# Patient Record
Sex: Female | Born: 1940
Health system: Southern US, Community
[De-identification: ages and names within clinical notes are randomized; demographics above are authoritative.]

## PROBLEM LIST (undated history)

## (undated) DIAGNOSIS — Z460 Encounter for fitting and adjustment of spectacles and contact lenses: Secondary | ICD-10-CM

## (undated) DIAGNOSIS — Z1501 Genetic susceptibility to malignant neoplasm of breast: Secondary | ICD-10-CM

## (undated) DIAGNOSIS — T783XXA Angioneurotic edema, initial encounter: Secondary | ICD-10-CM

## (undated) DIAGNOSIS — Z1379 Encounter for other screening for genetic and chromosomal anomalies: Secondary | ICD-10-CM

## (undated) DIAGNOSIS — Z1589 Genetic susceptibility to other disease: Principal | ICD-10-CM

## (undated) DIAGNOSIS — M1990 Unspecified osteoarthritis, unspecified site: Secondary | ICD-10-CM

## (undated) DIAGNOSIS — K5790 Diverticulosis of intestine, part unspecified, without perforation or abscess without bleeding: Secondary | ICD-10-CM

## (undated) DIAGNOSIS — K219 Gastro-esophageal reflux disease without esophagitis: Secondary | ICD-10-CM

## (undated) DIAGNOSIS — Z1509 Genetic susceptibility to other malignant neoplasm: Secondary | ICD-10-CM

## (undated) DIAGNOSIS — K5900 Constipation, unspecified: Secondary | ICD-10-CM

## (undated) DIAGNOSIS — Z853 Personal history of malignant neoplasm of breast: Secondary | ICD-10-CM

## (undated) DIAGNOSIS — Z1502 Genetic susceptibility to malignant neoplasm of ovary: Secondary | ICD-10-CM

## (undated) DIAGNOSIS — Z809 Family history of malignant neoplasm, unspecified: Secondary | ICD-10-CM

## (undated) DIAGNOSIS — I10 Essential (primary) hypertension: Secondary | ICD-10-CM

## (undated) HISTORY — DX: Genetic susceptibility to malignant neoplasm of breast: Z15.09

## (undated) HISTORY — PX: COLONOSCOPY: SHX174

## (undated) HISTORY — DX: Personal history of malignant neoplasm of breast: Z85.3

## (undated) HISTORY — PX: TUBAL LIGATION: SHX77

## (undated) HISTORY — DX: Genetic susceptibility to malignant neoplasm of breast: Z15.02

## (undated) HISTORY — DX: Encounter for other screening for genetic and chromosomal anomalies: Z13.79

## (undated) HISTORY — DX: Genetic susceptibility to other disease: Z15.89

## (undated) HISTORY — DX: Genetic susceptibility to malignant neoplasm of breast: Z15.01

## (undated) HISTORY — DX: Family history of malignant neoplasm, unspecified: Z80.9

## (undated) HISTORY — DX: Angioneurotic edema, initial encounter: T78.3XXA

---

## 1996-07-31 DIAGNOSIS — Z853 Personal history of malignant neoplasm of breast: Secondary | ICD-10-CM

## 1996-07-31 HISTORY — PX: BREAST BIOPSY: SHX20

## 1996-07-31 HISTORY — DX: Personal history of malignant neoplasm of breast: Z85.3

## 1996-07-31 HISTORY — PX: BREAST LUMPECTOMY: SHX2

## 1998-06-28 ENCOUNTER — Encounter: Admission: RE | Admit: 1998-06-28 | Discharge: 1998-07-27 | Payer: Self-pay | Admitting: Family Medicine

## 1999-02-17 ENCOUNTER — Other Ambulatory Visit: Admission: RE | Admit: 1999-02-17 | Discharge: 1999-02-17 | Payer: Self-pay | Admitting: Family Medicine

## 1999-06-17 ENCOUNTER — Ambulatory Visit (HOSPITAL_COMMUNITY): Admission: RE | Admit: 1999-06-17 | Discharge: 1999-06-17 | Payer: Self-pay | Admitting: Gastroenterology

## 1999-06-21 ENCOUNTER — Other Ambulatory Visit: Admission: RE | Admit: 1999-06-21 | Discharge: 1999-06-21 | Payer: Self-pay | Admitting: Family Medicine

## 1999-11-03 ENCOUNTER — Encounter: Payer: Self-pay | Admitting: Family Medicine

## 1999-11-03 ENCOUNTER — Encounter: Admission: RE | Admit: 1999-11-03 | Discharge: 1999-11-03 | Payer: Self-pay | Admitting: Family Medicine

## 2000-11-08 ENCOUNTER — Encounter: Payer: Self-pay | Admitting: Oncology

## 2000-11-08 ENCOUNTER — Encounter: Admission: RE | Admit: 2000-11-08 | Discharge: 2000-11-08 | Payer: Self-pay | Admitting: Oncology

## 2001-11-28 ENCOUNTER — Encounter: Admission: RE | Admit: 2001-11-28 | Discharge: 2001-11-28 | Payer: Self-pay | Admitting: Oncology

## 2001-11-28 ENCOUNTER — Encounter: Payer: Self-pay | Admitting: Oncology

## 2002-12-03 ENCOUNTER — Encounter: Payer: Self-pay | Admitting: Oncology

## 2002-12-03 ENCOUNTER — Encounter: Admission: RE | Admit: 2002-12-03 | Discharge: 2002-12-03 | Payer: Self-pay | Admitting: Oncology

## 2003-10-26 ENCOUNTER — Other Ambulatory Visit: Admission: RE | Admit: 2003-10-26 | Discharge: 2003-10-26 | Payer: Self-pay | Admitting: Family Medicine

## 2003-12-21 ENCOUNTER — Encounter: Admission: RE | Admit: 2003-12-21 | Discharge: 2003-12-21 | Payer: Self-pay | Admitting: Family Medicine

## 2004-01-26 ENCOUNTER — Encounter: Admission: RE | Admit: 2004-01-26 | Discharge: 2004-01-29 | Payer: Self-pay | Admitting: Family Medicine

## 2004-10-26 ENCOUNTER — Other Ambulatory Visit: Admission: RE | Admit: 2004-10-26 | Discharge: 2004-10-26 | Payer: Self-pay | Admitting: Family Medicine

## 2004-12-22 ENCOUNTER — Encounter: Admission: RE | Admit: 2004-12-22 | Discharge: 2004-12-22 | Payer: Self-pay | Admitting: Family Medicine

## 2005-11-02 ENCOUNTER — Other Ambulatory Visit: Admission: RE | Admit: 2005-11-02 | Discharge: 2005-11-02 | Payer: Self-pay | Admitting: Family Medicine

## 2006-01-18 ENCOUNTER — Encounter: Admission: RE | Admit: 2006-01-18 | Discharge: 2006-01-18 | Payer: Self-pay | Admitting: Family Medicine

## 2007-01-22 ENCOUNTER — Encounter: Admission: RE | Admit: 2007-01-22 | Discharge: 2007-01-22 | Payer: Self-pay | Admitting: Family Medicine

## 2008-01-23 ENCOUNTER — Encounter: Admission: RE | Admit: 2008-01-23 | Discharge: 2008-01-23 | Payer: Self-pay | Admitting: Family Medicine

## 2008-11-18 ENCOUNTER — Other Ambulatory Visit: Admission: RE | Admit: 2008-11-18 | Discharge: 2008-11-18 | Payer: Self-pay | Admitting: Family Medicine

## 2009-01-25 ENCOUNTER — Encounter: Admission: RE | Admit: 2009-01-25 | Discharge: 2009-01-25 | Payer: Self-pay | Admitting: Family Medicine

## 2010-01-26 ENCOUNTER — Encounter: Admission: RE | Admit: 2010-01-26 | Discharge: 2010-01-26 | Payer: Self-pay | Admitting: Family Medicine

## 2010-07-31 HISTORY — PX: ABDOMINAL HYSTERECTOMY: SHX81

## 2010-07-31 HISTORY — PX: ANTERIOR AND POSTERIOR REPAIR: SHX1172

## 2010-08-21 ENCOUNTER — Encounter: Payer: Self-pay | Admitting: Family Medicine

## 2010-10-06 ENCOUNTER — Encounter (HOSPITAL_COMMUNITY)
Admission: RE | Admit: 2010-10-06 | Discharge: 2010-10-06 | Disposition: A | Discharge status: Home / Self Care | Payer: Medicare Other | Source: Ambulatory Visit | Attending: Obstetrics and Gynecology | Admitting: Obstetrics and Gynecology

## 2010-10-06 DIAGNOSIS — Z01812 Encounter for preprocedural laboratory examination: Secondary | ICD-10-CM | POA: Insufficient documentation

## 2010-10-06 DIAGNOSIS — Z01810 Encounter for preprocedural cardiovascular examination: Secondary | ICD-10-CM | POA: Insufficient documentation

## 2010-10-06 LAB — COMPREHENSIVE METABOLIC PANEL
ALT: 21 U/L (ref 0–35)
Calcium: 9.4 mg/dL (ref 8.4–10.5)
Chloride: 92 mEq/L — ABNORMAL LOW (ref 96–112)
GFR calc Af Amer: >60 mL/min (ref >60)
Potassium: 3.1 mEq/L — ABNORMAL LOW (ref 3.5–5.1)
Sodium: 130 mEq/L — ABNORMAL LOW (ref 135–145)
Total Bilirubin: 1.2 mg/dL (ref 0.3–1.2)
Total Protein: 7 g/dL (ref 6.0–8.3)

## 2010-10-06 LAB — URINALYSIS, ROUTINE W REFLEX MICROSCOPIC
Bilirubin Urine: NEGATIVE
Glucose, UA: NEGATIVE mg/dL
Protein, ur: NEGATIVE mg/dL
pH: 6.5 (ref 5.0–8.0)

## 2010-10-06 LAB — CBC
MCHC: 35.3 g/dL (ref 30.0–36.0)
MCV: 91.7 fL (ref 78.0–100.0)
RBC: 4.2 MIL/uL (ref 3.87–5.11)
RDW: 12 % (ref 11.5–15.5)

## 2010-10-06 LAB — PROTIME-INR: Prothrombin Time: 12.9 seconds (ref 11.6–15.2)

## 2010-10-06 LAB — APTT: aPTT: 29 seconds (ref 24–37)

## 2010-10-06 LAB — SURGICAL PCR SCREEN: MRSA, PCR: NEGATIVE

## 2010-10-11 ENCOUNTER — Inpatient Hospital Stay (HOSPITAL_COMMUNITY)
Admission: RE | Admit: 2010-10-11 | Discharge: 2010-10-13 | DRG: 743 | Disposition: A | Discharge status: Home / Self Care | Payer: Medicare Other | Source: Ambulatory Visit | Attending: Obstetrics and Gynecology | Admitting: Obstetrics and Gynecology

## 2010-10-11 ENCOUNTER — Other Ambulatory Visit: Payer: Self-pay | Admitting: Obstetrics and Gynecology

## 2010-10-11 DIAGNOSIS — N815 Vaginal enterocele: Secondary | ICD-10-CM | POA: Diagnosis present

## 2010-10-11 DIAGNOSIS — N393 Stress incontinence (female) (male): Secondary | ICD-10-CM | POA: Diagnosis present

## 2010-10-11 DIAGNOSIS — N840 Polyp of corpus uteri: Secondary | ICD-10-CM | POA: Diagnosis present

## 2010-10-11 DIAGNOSIS — K219 Gastro-esophageal reflux disease without esophagitis: Secondary | ICD-10-CM | POA: Diagnosis present

## 2010-10-11 DIAGNOSIS — D251 Intramural leiomyoma of uterus: Secondary | ICD-10-CM | POA: Diagnosis present

## 2010-10-11 DIAGNOSIS — I10 Essential (primary) hypertension: Secondary | ICD-10-CM | POA: Diagnosis present

## 2010-10-11 DIAGNOSIS — N812 Incomplete uterovaginal prolapse: Principal | ICD-10-CM | POA: Diagnosis present

## 2010-10-11 LAB — BASIC METABOLIC PANEL
CO2: 27 mEq/L (ref 19–32)
Chloride: 98 mEq/L (ref 96–112)
Creatinine, Ser: 0.55 mg/dL (ref 0.4–1.2)

## 2010-10-12 LAB — BASIC METABOLIC PANEL
CO2: 28 mEq/L (ref 19–32)
Creatinine, Ser: 0.47 mg/dL (ref 0.4–1.2)
GFR calc non Af Amer: >60 mL/min (ref >60)
Glucose, Bld: 129 mg/dL — ABNORMAL HIGH (ref 70–99)
Potassium: 3.6 mEq/L (ref 3.5–5.1)
Sodium: 135 mEq/L (ref 135–145)

## 2010-10-12 LAB — CBC
HCT: 30 % — ABNORMAL LOW (ref 36.0–46.0)
Hemoglobin: 10.4 g/dL — ABNORMAL LOW (ref 12.0–15.0)
MCH: 32.2 pg (ref 26.0–34.0)
Platelets: 235 10*3/uL (ref 150–400)
RBC: 3.23 MIL/uL — ABNORMAL LOW (ref 3.87–5.11)
RDW: 12.2 % (ref 11.5–15.5)
WBC: 13.1 10*3/uL — ABNORMAL HIGH (ref 4.0–10.5)

## 2010-11-16 NOTE — Op Note (Signed)
Elaine Carter, Elaine Carter                ACCOUNT NO.:  [id number]  MEDICAL RECORD NO.:  [id number]           PATIENT TYPE:  I  LOCATION:  9302                          FACILITY:  [facility]  PHYSICIAN:  Randye Lobo, M.D.   DATE OF BIRTH:  1941-02-15  DATE OF PROCEDURE:  10/11/2010 DATE OF DISCHARGE:                              OPERATIVE REPORT   PREOPERATIVE DIAGNOSES: 1. Incomplete uterovaginal prolapse. 2. Genuine stress incontinence.  POSTOPERATIVE DIAGNOSES: 1. Incomplete uterovaginal prolapse. 2. Genuine stress incontinence.  PROCEDURES: 1. Laparoscopically-assisted vaginal hysterectomy with bilateral     salpingo-oophorectomy. 2. McCall's culdoplasty. 3. Anterior and posterior colporrhaphy. 4. Tension-free vaginal tape, midurethral sling with cystoscopy.  SURGEON:  Randye Lobo, MD  ASSISTANT:  Gretchen Short, PAC.  ANESTHESIA:  General endotracheal, local with 1% lidocaine with epinephrine 1:100,000 and local 0.25% Marcaine to the skin.  IV FLUIDS:  2800 mL Ringer's lactate.  ESTIMATED BLOOD LOSS:  125 mL.  URINE OUTPUT:  Quantity sufficient.  COMPLICATIONS:  None.  INDICATIONS FOR PROCEDURE:  The patient is a 70 year old gravida 2, para 2 Caucasian female, status post bilateral tubal ligation who was referred by her primary physician, Dr. Blair Heys, for evaluation and treatment of progressive vaginal protrusion and urinary incontinence with stressful maneuvers.  The patient underwent multichannel urodynamic testing in the office which confirmed the presence of genuine stress incontinence.  The patient desires surgery.  On physical exam in the office, the patient is noted to have a first-degree cystocele, first-to- second-degree uterine prolapse, and a minimal rectocele.  The uterus is small and no adnexal masses are appreciated.  A plan is now made to proceed with a laparoscopically-assisted vaginal hysterectomy with bilateral salpingo-oophorectomy, and  anterior and posterior colporrhaphy, a midurethral sling and cystoscopy after risks, benefits, and alternatives have been reviewed.  FINDINGS:  Examination under anesthesia revealed first-to-second-degree uterine prolapse.  There was a first-degree cystocele and a first-degree high rectocele versus a small enterocele.  At the time of laparoscopy, the uterus demonstrated a 1.5-cm fundal fibroid.  The tubes were consistent with the bilateral tubal ligation. The ovaries were normal.  The appendix, gallbladder, liver, and peritoneal surfaces appeared to be unremarkable.  Of note, there was a small foreign body detected inside the peritoneal cavity which was a purple piece of plastic or latex which measured less than 1 cm in diameter, this was removed.  Cystoscopy with placement of the sling demonstrated the absence of a foreign body in the bladder, the urethra.  The bladder was visualized throughout 360 degrees including the bladder dome and trigone.  The ureters were noted to be patent bilaterally.  SPECIMEN:  The uterus, tubes, and ovaries were sent to Pathology.  A small piece of plastic latex was removed and discarded.  DESCRIPTION OF PROCEDURE:  The patient was reidentified in the preoperative hold area.  She received cefotetan IV and she received PAS hose and TED stockings.  In the operating room, general endotracheal anesthesia was induced and the patient had both arms tucked by her sides.  She was placed in the dorsal lithotomy position.  The abdomen, vagina, and  perineum were then sterilely prepped and draped.  A Foley catheter was placed inside the bladder.  A speculum was placed inside the vagina and a single-tooth tenaculum was placed on the anterior cervical lip and this was replaced with a Hulka tenaculum.  Attention was turned to the abdomen where a 1.5-cm incision was created infraumbilically along the patient's previous tubal ligation incision. The incision was  created sharply.  Dissection was performed down into the subcutaneous layer to identify the fascia.  The fascia was elevated with 2 Kocher clamps and the fascia was then incised transversely.  The peritoneal cavity was then entered sharply with a Metzenbaum scissors after the peritoneum was grasped with 2 hemostat clamps.  There was no evidence of any adhesions underneath the infraumbilical incision.  The Hasson cannula was placed after the fascia was secured with 0 Vicryl suture.  The laparoscope confirmed proper placement and then the CO2 pneumoperitoneum was achieved.  The patient was placed in the Trendelenburg position.  A 5-mm right and left lower quadrant incisions were created with a scalpel and 5-mm trocars were placed under direct visualization of the laparoscope.  An inspection of the pelvic and abdominal organs was performed and the findings are as noted above.  The piece of latex was identified and this was removed and confirmed to be out of the peritoneal cavity. Please note that this piece of latex was ultimately removed with final removal of the right lower quadrant trocar at the termination of the case.  Each of the ureters were identified.  A hysterectomy was performed using the gyrus instrument.  The left infundibulopelvic ligament was grasped with the gyrus and triply cauterized prior to being bisected. Dissection continued through the broad ligament using the same instrument.  The left round ligament was similarly cauterized and dissected using the gyrus.  Dissection was then performed through the anterior leaf of the broad ligament using the same instrument.  A monopolar scissors was used to dissect down the bladder flap anteriorly and posteriorly.  The uterine artery was triply cauterized and cut using the gyrus instrument.  The same procedure that was performed on the left- hand side was then repeated on the right-hand side.  The right uterine artery was  cauterized but not cut.  Hemostasis was excellent at this time and the procedure was continued vaginally.  The laparoscopic instruments were removed and the trocars remained.  The patient was placed in the high lithotomy position.  Lahey clamps were placed on the anterior and posterior cervical lips.  The cervix was injected circumferentially with 1% lidocaine with epinephrine 1:100,000. The cervix was circumscribed with a scalpel.  The posterior cul-de-sac was entered sharply with a curved Mayo scissors.  Digital exam confirmed proper entry into this location.  The long weighted speculum was placed inside the cul-de-sac.  Each of the uterosacral ligaments were then clamped with Heaney clamps, sharply divided, and suture ligated with transfixing sutures of 0 Vicryl.  The bladder was dissected off the anterior cul-de-sac and the anterior cul-de-sac was entered sharply. Again, digital exam confirmed proper entry into this location.  A Deaver was placed in the anterior cul-de-sac.  The inferior aspects of the cardinal ligaments were then clamped, sharply divided, and suture ligated with 0 Vicryl sutures bilaterally.  The specimen was freed at this time and was sent to Pathology.  The posterior vaginal cuff was then whip stitched with a running locked suture of 0 Vicryl.  A McCall culdoplasty was performed at this time  with a 0 Vicryl suture.  The suture was brought through the vaginal cuff at 6 o'clock and into the posterior cul-de-sac, down through the distal left uterosacral ligament, across the posterior cul-de-sac, down through the distal right uterosacral ligament and then into the cul-de-sac and out the vagina again at the 6 o'clock position.  The suture was held until the end of the case, at which time it was tied for excellent vaginal cuff support.  The anterior colporrhaphy was performed at this time.  Allis clamps were used to mark the anterior vaginal mucosa in the midline.   The mucosa was injected with local 1% lidocaine with epinephrine 1:100,000.  The mucosa was incised vertically in the midline.  Sharp and blunt dissection were used to dissect the subvaginal tissue and bladder off the overlying vaginal mucosa bilaterally.  The dissection was carried back to the level of the pubic rami and down to the uterosacral ligament pedicles.  The sling was performed at this time.  A 1-cm suprapubic incisions were created 2 cm to the right and left in the midline.  The sling was performed in a top-down fashion.  The abdominal needle passer was placed through the right suprapubic incision and out through the endopelvic fascia at the level of the mid urethra and lateral to it on the ipsilateral side.  The same was performed on the left-hand side.  The Foley catheter was removed and cystoscopy was performed and the findings are as noted above.  The sling was then attached to the abdominal needle passers and drawn up through the suprapubic incisions.  The positioning on the sling was adjusted and a Kelly clamp was placed between the sling and the urethra as the plastic sheaths were removed.  The sling was in excellent position.  The excess sling was trimmed suprapubically.  The anterior colporrhaphy was next performed by placing vertical mattress sutures of 2-0 Vicryl at the urethrovesical junction and then 0 Vicryl through the fascia overlying the bladder dome.  Excess vaginal mucosa was trimmed and the anterior vaginal mucosa was then closed with a running locked suture of 2-0 Vicryl.  The vaginal cuff was closed at this time by placing a running lock suture of 0 Vicryl.  Attention was then turned to the posterior vaginal mucosa.  The posterior colporrhaphy was performed.  Allis clamps were again used to mark the posterior vaginal mucosa in the midline.  The introitus was marked with Allis clamps as well.  The perineal body and the mucosa were injected with 1%  lidocaine with epinephrine 1:100,000.  A triangular wedge of epithelium was excised from the perineal body in the posterior vaginal mucosa was incised vertically in the midline with the Metzenbaum scissors.  Sharp and blunt dissection were used to dissect the rectovaginal fascia off the overlying vaginal mucosa bilaterally. Rectal exam was performed at this time and this confirmed the presence of a very small enterocele.  The enterocele was reduced by placing vertical mattress sutures of 2-0 Vicryl.  The posterior colporrhaphy was performed by placing vertical mattress sutures of 0 Vicryl.  The excess vaginal mucosa was trimmed and the posterior vaginal mucosa was then closed with a running locked suture of 2-0 Vicryl down to the level of the hymen.  A crown stitch of 0 Vicryl was placed in the perineal body. The remainder of the closure of the perineum was performed with a continued suture of 2-0 Vicryl for an episiotomy repair with a subcuticular suture used to close  the perineum up to the level of the hymen.  Hemostasis was good at this time.  Final rectal exam confirmed the absence of sutures in the rectum.  A vaginal packing of plain gauze was placed in the vagina.  The suprapubic incisions were closed with Dermabond.  Final laparoscopy was performed after the pneumoperitoneum was reachieved.  The pelvis was irrigated and suctioned.  There was no evidence of any bleeding.  The lower abdominal trocars were removed. The piece of latex was confirmed to be removed with a trocar of the right lower quadrant.  The pneumoperitoneum was released and the umbilical trocar was removed.  A separate figure-of-eight suture of 0 Vicryl was used to close the fascia.  All skin incisions were then closed with subcuticular sutures of 4-0 Vicryl.  The skin incisions were injected locally with 0.25% Marcaine and Dermabond was placed over the incisions.  The patient's Foley catheter was left to  gravity drainage.  The patient was awakened and extubated and escorted to the recovery room in stable condition.  There were no complications to the procedure.  All needle, instrument, sponge counts were correct.     Randye Lobo, M.D.     BES/MEDQ  D:  10/11/2010  T:  10/12/2010  Job:  [id number]  cc:   Bryan Lemma. Manus Gunning, M.D. Fax: [phone]  Electronically Signed by Conley Simmonds M.D. on 11/16/2010 01:57:29 PM

## 2010-11-17 NOTE — Discharge Summary (Signed)
NAMELAINEE, Elaine Carter                ACCOUNT NO.:  [id number]  MEDICAL RECORD NO.:  [id number]           PATIENT TYPE:  I  LOCATION:  9302                          FACILITY:  [facility]  PHYSICIAN:  Randye Lobo, M.D.   DATE OF BIRTH:  1941/04/19  DATE OF ADMISSION:  10/11/2010 DATE OF DISCHARGE:  10/13/2010                              DISCHARGE SUMMARY   ADMISSION DIAGNOSES: 1. Incomplete uterovaginal prolapse. 2. Genuine stress incontinence. 3. Hyponatremia. 4. Hypokalemia.  DISCHARGE DIAGNOSES: 1. Incomplete uterovaginal prolapse. 2. Genuine stress incontinence. 3. Hyponatremia, resolved. 4. Hypokalemia, resolved.  ADMISSION HISTORY AND PHYSICAL EXAMINATION:  The patient is a 70 year old gravida 2, para 2 Caucasian female who presented with progressive urinary incontinence with stressful maneuvers and the desire for surgical treatment.  The patient's medical history was significant for left breast cancer and hypertension.  The patient was taking multiple medications to control her hypertension including diltiazem, losartan, and Micardis.  On physical examination, the patient's pelvic exam demonstrated normal external genitalia and a normal urethra.  There was a minimal to first- degree cystocele, first to second-degree uterine prolapse, and minimal rectocele.  The uterus was small and nontender.  No adnexal masses nor tenderness were appreciated.  The patient's preoperative sodium was 130 and potassium was 3.1.  The patient was admitted on October 11, 2010, at which time she underwent a laparoscopically-assisted vaginal hysterectomy with bilateral salpingo- oophorectomy, McCall culdoplasty, anterior and posterior colporrhaphy, and tension-free vaginal tape, mid urethral sling with cystoscopy which was performed without complication at the [facility] [place] under the direction of Dr. Conley Simmonds and with the assistance of Gretchen Short, PA-C.  Estimated blood loss  from surgery was 125 mL, and there were no complications.  In the post anesthesia care unit following surgery, the patient's sodium was rechecked and noted to be 130 and her potassium was 2.9.  The patient underwent replacement of electrolytes with normal saline with 20 mEq of KCl per liter.  By postoperative day #1, her sodium was 135 and her potassium was 3.6.  The patient had good control of her pain with a morphine PCA and Toradol during the night following her surgery.  She experienced nausea and vomiting.  The PCA was discontinued on postoperative day #1, and the patient was started on Percocet which she tolerated well.  The patient had her Foley catheter and vaginal packing removed on postop day #1, and she began her voiding trials.  She was able to void well with low postvoid residuals of less than 50 mL.  The patient was found to be in good condition and ready for discharge by postoperative day #2.  She was able to tolerate a regular diet, had good pain control, ambulate independently, and spontaneously void.  DISCHARGE INSTRUCTIONS: 1. Discharged to home. 2. The patient will take the following medications:     a.     Percocet 5 mg/325 mg one to two p.o. q.4-6 h. p.r.n. pain.     b.     Ciprofloxacin 250 mg p.o. b.i.d. x5 days for antibiotic      prophylaxis. 3.  The patient will resume all of her previous medications and their usual dosages. 4. The patient will follow up in the office in 7-10 days for her first     postoperative visit. 5. The patient will have decreased activity for the next 6 weeks.  She     will not drive for 2 weeks.  She will not do any heavy lifting or     have any sexual activity for the next 12 weeks.  The patient will call if she experiences problems with fever, nausea and vomiting, pain uncontrolled by her medication, increasing pain, heavy vaginal bleeding, incisional redness or drainage, or any other concern.     Randye Lobo,  M.D.     BES/MEDQ  D:  11/16/2010  T:  11/17/2010  Job:  [id number]  Electronically Signed by Conley Simmonds M.D. on 11/17/2010 06:39:44 AM

## 2011-01-04 ENCOUNTER — Other Ambulatory Visit: Payer: Self-pay | Admitting: Family Medicine

## 2011-01-04 DIAGNOSIS — Z1231 Encounter for screening mammogram for malignant neoplasm of breast: Secondary | ICD-10-CM

## 2011-01-13 ENCOUNTER — Other Ambulatory Visit: Payer: Self-pay | Admitting: Family Medicine

## 2011-01-13 ENCOUNTER — Ambulatory Visit
Admission: RE | Admit: 2011-01-13 | Discharge: 2011-01-13 | Disposition: A | Discharge status: Home / Self Care | Payer: Medicare Other | Source: Ambulatory Visit | Attending: Family Medicine | Admitting: Family Medicine

## 2011-01-13 DIAGNOSIS — E871 Hypo-osmolality and hyponatremia: Secondary | ICD-10-CM

## 2011-02-02 ENCOUNTER — Ambulatory Visit
Admission: RE | Admit: 2011-02-02 | Discharge: 2011-02-02 | Disposition: A | Discharge status: Home / Self Care | Payer: Medicare Other | Source: Ambulatory Visit | Attending: Family Medicine | Admitting: Family Medicine

## 2011-02-02 DIAGNOSIS — Z1231 Encounter for screening mammogram for malignant neoplasm of breast: Secondary | ICD-10-CM

## 2011-12-20 ENCOUNTER — Other Ambulatory Visit: Payer: Self-pay | Admitting: Plastic Surgery

## 2011-12-20 ENCOUNTER — Ambulatory Visit
Admission: RE | Admit: 2011-12-20 | Discharge: 2011-12-20 | Disposition: A | Discharge status: Home / Self Care | Payer: Medicare Other | Source: Ambulatory Visit | Attending: Plastic Surgery | Admitting: Plastic Surgery

## 2011-12-20 DIAGNOSIS — IMO0002 Reserved for concepts with insufficient information to code with codable children: Secondary | ICD-10-CM

## 2012-01-22 ENCOUNTER — Other Ambulatory Visit: Payer: Self-pay | Admitting: Family Medicine

## 2012-01-22 DIAGNOSIS — Z1231 Encounter for screening mammogram for malignant neoplasm of breast: Secondary | ICD-10-CM

## 2012-02-21 ENCOUNTER — Ambulatory Visit: Payer: Medicare Other

## 2012-02-28 ENCOUNTER — Ambulatory Visit
Admission: RE | Admit: 2012-02-28 | Discharge: 2012-02-28 | Disposition: A | Discharge status: Home / Self Care | Payer: Medicare Other | Source: Ambulatory Visit | Attending: Family Medicine | Admitting: Family Medicine

## 2012-02-28 DIAGNOSIS — Z1231 Encounter for screening mammogram for malignant neoplasm of breast: Secondary | ICD-10-CM

## 2013-01-20 ENCOUNTER — Other Ambulatory Visit: Payer: Self-pay

## 2013-01-20 DIAGNOSIS — Z1231 Encounter for screening mammogram for malignant neoplasm of breast: Secondary | ICD-10-CM

## 2013-01-20 DIAGNOSIS — Z853 Personal history of malignant neoplasm of breast: Secondary | ICD-10-CM

## 2013-01-20 DIAGNOSIS — Z9889 Other specified postprocedural states: Secondary | ICD-10-CM

## 2013-02-03 ENCOUNTER — Encounter (HOSPITAL_BASED_OUTPATIENT_CLINIC_OR_DEPARTMENT_OTHER): Payer: Self-pay | Admitting: *Deleted

## 2013-02-03 NOTE — Progress Notes (Signed)
To come in for bmet-ekg-just got back from [facility]

## 2013-02-04 ENCOUNTER — Other Ambulatory Visit: Payer: Self-pay

## 2013-02-04 ENCOUNTER — Encounter (HOSPITAL_BASED_OUTPATIENT_CLINIC_OR_DEPARTMENT_OTHER)
Admission: RE | Admit: 2013-02-04 | Discharge: 2013-02-04 | Disposition: A | Discharge status: Home / Self Care | Payer: Medicare Other | Source: Ambulatory Visit | Attending: Plastic Surgery | Admitting: Plastic Surgery

## 2013-02-04 LAB — BASIC METABOLIC PANEL
BUN: 22 mg/dL (ref 6–23)
Calcium: 9.4 mg/dL (ref 8.4–10.5)
Creatinine, Ser: 0.69 mg/dL (ref 0.50–1.10)
GFR calc Af Amer: >90 mL/min (ref >90)
GFR calc non Af Amer: 86 mL/min — ABNORMAL LOW (ref >90)
Glucose, Bld: 119 mg/dL — ABNORMAL HIGH (ref 70–99)
Potassium: 3.5 mEq/L (ref 3.5–5.1)

## 2013-02-04 NOTE — Progress Notes (Signed)
ekg cleared by dr Gelene Mink

## 2013-02-06 ENCOUNTER — Encounter (HOSPITAL_BASED_OUTPATIENT_CLINIC_OR_DEPARTMENT_OTHER): Payer: Self-pay | Admitting: Anesthesiology

## 2013-02-06 ENCOUNTER — Encounter (HOSPITAL_BASED_OUTPATIENT_CLINIC_OR_DEPARTMENT_OTHER)
Admission: RE | Disposition: A | Discharge status: Home / Self Care | Payer: Self-pay | Source: Ambulatory Visit | Attending: Plastic Surgery

## 2013-02-06 ENCOUNTER — Ambulatory Visit (HOSPITAL_BASED_OUTPATIENT_CLINIC_OR_DEPARTMENT_OTHER)
Admission: RE | Admit: 2013-02-06 | Discharge: 2013-02-06 | Disposition: A | Discharge status: Home / Self Care | Payer: Medicare Other | Source: Ambulatory Visit | Attending: Plastic Surgery | Admitting: Plastic Surgery

## 2013-02-06 ENCOUNTER — Encounter (HOSPITAL_BASED_OUTPATIENT_CLINIC_OR_DEPARTMENT_OTHER): Payer: Self-pay | Admitting: *Deleted

## 2013-02-06 ENCOUNTER — Ambulatory Visit (HOSPITAL_BASED_OUTPATIENT_CLINIC_OR_DEPARTMENT_OTHER): Payer: Medicare Other | Admitting: Anesthesiology

## 2013-02-06 DIAGNOSIS — K5900 Constipation, unspecified: Secondary | ICD-10-CM | POA: Insufficient documentation

## 2013-02-06 DIAGNOSIS — D1739 Benign lipomatous neoplasm of skin and subcutaneous tissue of other sites: Secondary | ICD-10-CM | POA: Insufficient documentation

## 2013-02-06 DIAGNOSIS — Z9109 Other allergy status, other than to drugs and biological substances: Secondary | ICD-10-CM | POA: Insufficient documentation

## 2013-02-06 DIAGNOSIS — Z79899 Other long term (current) drug therapy: Secondary | ICD-10-CM | POA: Insufficient documentation

## 2013-02-06 DIAGNOSIS — K219 Gastro-esophageal reflux disease without esophagitis: Secondary | ICD-10-CM | POA: Insufficient documentation

## 2013-02-06 DIAGNOSIS — Z885 Allergy status to narcotic agent status: Secondary | ICD-10-CM | POA: Insufficient documentation

## 2013-02-06 DIAGNOSIS — M129 Arthropathy, unspecified: Secondary | ICD-10-CM | POA: Insufficient documentation

## 2013-02-06 DIAGNOSIS — I10 Essential (primary) hypertension: Secondary | ICD-10-CM | POA: Insufficient documentation

## 2013-02-06 DIAGNOSIS — Z87891 Personal history of nicotine dependence: Secondary | ICD-10-CM | POA: Insufficient documentation

## 2013-02-06 DIAGNOSIS — D1720 Benign lipomatous neoplasm of skin and subcutaneous tissue of unspecified limb: Secondary | ICD-10-CM | POA: Diagnosis present

## 2013-02-06 DIAGNOSIS — K5730 Diverticulosis of large intestine without perforation or abscess without bleeding: Secondary | ICD-10-CM | POA: Insufficient documentation

## 2013-02-06 HISTORY — DX: Diverticulosis of intestine, part unspecified, without perforation or abscess without bleeding: K57.90

## 2013-02-06 HISTORY — DX: Essential (primary) hypertension: I10

## 2013-02-06 HISTORY — DX: Unspecified osteoarthritis, unspecified site: M19.90

## 2013-02-06 HISTORY — PX: LIPOMA EXCISION: SHX5283

## 2013-02-06 HISTORY — DX: Constipation, unspecified: K59.00

## 2013-02-06 HISTORY — DX: Gastro-esophageal reflux disease without esophagitis: K21.9

## 2013-02-06 HISTORY — DX: Encounter for fitting and adjustment of spectacles and contact lenses: Z46.0

## 2013-02-06 SURGERY — EXCISION LIPOMA
Anesthesia: General | Site: Arm Upper | Laterality: Left | Wound class: Clean

## 2013-02-06 MED ORDER — MIDAZOLAM HCL 5 MG/5ML IJ SOLN
INTRAMUSCULAR | Status: DC | PRN
  Administered 2013-02-06: 2 mg via INTRAVENOUS

## 2013-02-06 MED ORDER — OXYCODONE HCL 5 MG/5ML PO SOLN: 5.0000 mg | Freq: Once | ORAL | Status: DC | PRN

## 2013-02-06 MED ORDER — OXYCODONE HCL 5 MG PO TABS: 5.0000 mg | ORAL_TABLET | Freq: Once | ORAL | Status: DC | PRN

## 2013-02-06 MED ORDER — OXYCODONE HCL 5 MG PO TABS
5.0000 mg | ORAL_TABLET | Freq: Once | ORAL | Status: AC | PRN
  Administered 2013-02-06: 5 mg via ORAL

## 2013-02-06 MED ORDER — MEPERIDINE HCL 25 MG/ML IJ SOLN: 6.2500 mg | INTRAMUSCULAR | Status: DC | PRN

## 2013-02-06 MED ORDER — ONDANSETRON HCL 4 MG/2ML IJ SOLN: 4.0000 mg | Freq: Once | INTRAMUSCULAR | Status: DC | PRN

## 2013-02-06 MED ORDER — CEFAZOLIN SODIUM-DEXTROSE 2-3 GM-% IV SOLR
INTRAVENOUS | Status: DC | PRN
  Administered 2013-02-06: 2 g via INTRAVENOUS

## 2013-02-06 MED ORDER — HYDROMORPHONE HCL PF 1 MG/ML IJ SOLN
0.2500 mg | INTRAMUSCULAR | Status: DC | PRN
  Administered 2013-02-06: 0.25 mg via INTRAVENOUS

## 2013-02-06 MED ORDER — FENTANYL CITRATE 0.05 MG/ML IJ SOLN
INTRAMUSCULAR | Status: DC | PRN
  Administered 2013-02-06: 100 ug via INTRAVENOUS

## 2013-02-06 MED ORDER — LIDOCAINE HCL (CARDIAC) 20 MG/ML IV SOLN
INTRAVENOUS | Status: DC | PRN
  Administered 2013-02-06: 75 mg via INTRAVENOUS

## 2013-02-06 MED ORDER — FENTANYL CITRATE 0.05 MG/ML IJ SOLN: 50.0000 ug | INTRAMUSCULAR | Status: DC | PRN

## 2013-02-06 MED ORDER — DEXAMETHASONE SODIUM PHOSPHATE 4 MG/ML IJ SOLN
INTRAMUSCULAR | Status: DC | PRN
  Administered 2013-02-06: 10 mg via INTRAVENOUS

## 2013-02-06 MED ORDER — MIDAZOLAM HCL 2 MG/2ML IJ SOLN: 1.0000 mg | INTRAMUSCULAR | Status: DC | PRN

## 2013-02-06 MED ORDER — PROPOFOL 10 MG/ML IV BOLUS
INTRAVENOUS | Status: DC | PRN
  Administered 2013-02-06: 150 mg via INTRAVENOUS

## 2013-02-06 MED ORDER — LACTATED RINGERS IV SOLN
INTRAVENOUS | Status: DC
  Administered 2013-02-06 (×2): via INTRAVENOUS

## 2013-02-06 MED ORDER — OXYCODONE HCL 5 MG/5ML PO SOLN: 5.0000 mg | Freq: Once | ORAL | Status: AC | PRN

## 2013-02-06 MED ORDER — ONDANSETRON HCL 4 MG/2ML IJ SOLN
INTRAMUSCULAR | Status: DC | PRN
  Administered 2013-02-06: 4 mg via INTRAVENOUS

## 2013-02-06 MED ORDER — BUPIVACAINE-EPINEPHRINE 0.25% -1:200000 IJ SOLN
INTRAMUSCULAR | Status: DC | PRN
  Administered 2013-02-06: 20 mL

## 2013-02-06 SURGICAL SUPPLY — 59 items
ADH SKN CLS APL DERMABOND .7 (GAUZE/BANDAGES/DRESSINGS) ×1
APL SKNCLS STERI-STRIP NONHPOA (GAUZE/BANDAGES/DRESSINGS)
BANDAGE CONFORM 2  STR LF (GAUZE/BANDAGES/DRESSINGS) IMPLANT
BENZOIN TINCTURE PRP APPL 2/3 (GAUZE/BANDAGES/DRESSINGS) IMPLANT
BLADE SURG 15 STRL LF DISP TIS (BLADE) ×1 IMPLANT
BLADE SURG 15 STRL SS (BLADE) ×2
BLADE SURG ROTATE 9660 (MISCELLANEOUS) IMPLANT
BNDG CMPR MD 5X2 ELC HKLP STRL (GAUZE/BANDAGES/DRESSINGS)
BNDG ELASTIC 2 VLCR STRL LF (GAUZE/BANDAGES/DRESSINGS) IMPLANT
CANISTER SUCTION 1200CC (MISCELLANEOUS) IMPLANT
CLEANER CAUTERY TIP 5X5 PAD (MISCELLANEOUS) IMPLANT
CLOTH BEACON ORANGE TIMEOUT ST (SAFETY) ×2 IMPLANT
CORDS BIPOLAR (ELECTRODE) IMPLANT
COVER MAYO STAND STRL (DRAPES) ×2 IMPLANT
COVER TABLE BACK 60X90 (DRAPES) ×2 IMPLANT
DERMABOND ADVANCED (GAUZE/BANDAGES/DRESSINGS) ×1
DERMABOND ADVANCED .7 DNX12 (GAUZE/BANDAGES/DRESSINGS) IMPLANT
DRAPE U-SHAPE 76X120 STRL (DRAPES) ×2 IMPLANT
ELECT COATED BLADE 2.86 ST (ELECTRODE) IMPLANT
ELECT NDL BLADE 2-5/6 (NEEDLE) ×1 IMPLANT
ELECT NEEDLE BLADE 2-5/6 (NEEDLE) ×2 IMPLANT
ELECT REM PT RETURN 9FT ADLT (ELECTROSURGICAL) ×2
ELECT REM PT RETURN 9FT PED (ELECTROSURGICAL)
ELECTRODE REM PT RETRN 9FT PED (ELECTROSURGICAL) IMPLANT
ELECTRODE REM PT RTRN 9FT ADLT (ELECTROSURGICAL) ×1 IMPLANT
GAUZE SPONGE 4X4 12PLY STRL LF (GAUZE/BANDAGES/DRESSINGS) IMPLANT
GAUZE XEROFORM 1X8 LF (GAUZE/BANDAGES/DRESSINGS) IMPLANT
GLOVE BIO SURGEON STRL SZ 6.5 (GLOVE) ×4 IMPLANT
GLOVE SURG SS PI 7.0 STRL IVOR (GLOVE) ×4 IMPLANT
GOWN PREVENTION PLUS XLARGE (GOWN DISPOSABLE) ×3 IMPLANT
NDL HYPO 30GX1 BEV (NEEDLE) IMPLANT
NEEDLE 27GAX1X1/2 (NEEDLE) ×2 IMPLANT
NEEDLE HYPO 30GX1 BEV (NEEDLE) IMPLANT
NS IRRIG 1000ML POUR BTL (IV SOLUTION) IMPLANT
PACK BASIN DAY SURGERY FS (CUSTOM PROCEDURE TRAY) ×2 IMPLANT
PAD CLEANER CAUTERY TIP 5X5 (MISCELLANEOUS)
PENCIL BUTTON HOLSTER BLD 10FT (ELECTRODE) ×1 IMPLANT
RUBBERBAND STERILE (MISCELLANEOUS) IMPLANT
SHEET MEDIUM DRAPE 40X70 STRL (DRAPES) IMPLANT
SPONGE GAUZE 2X2 8PLY STRL LF (GAUZE/BANDAGES/DRESSINGS) IMPLANT
SPONGE GAUZE 4X4 12PLY (GAUZE/BANDAGES/DRESSINGS) ×2 IMPLANT
STRIP CLOSURE SKIN 1/2X4 (GAUZE/BANDAGES/DRESSINGS) ×1 IMPLANT
SUCTION FRAZIER TIP 10 FR DISP (SUCTIONS) IMPLANT
SUT CHROMIC 4 0 P 3 18 (SUTURE) IMPLANT
SUT ETHILON 4 0 PS 2 18 (SUTURE) IMPLANT
SUT ETHILON 5 0 P 3 18 (SUTURE)
SUT MNCRL AB 4-0 PS2 18 (SUTURE) IMPLANT
SUT MON AB 5-0 P3 18 (SUTURE) ×1 IMPLANT
SUT NYLON ETHILON 5-0 P-3 1X18 (SUTURE) IMPLANT
SUT PLAIN 5 0 P 3 18 (SUTURE) IMPLANT
SUT VIC AB 4-0 SH 27 (SUTURE) ×2
SUT VIC AB 4-0 SH 27XANBCTRL (SUTURE) IMPLANT
SUT VIC AB 5-0 P-3 18X BRD (SUTURE) IMPLANT
SUT VIC AB 5-0 P3 18 (SUTURE)
SUT VICRYL 4-0 PS2 18IN ABS (SUTURE) IMPLANT
SYR BULB 3OZ (MISCELLANEOUS) IMPLANT
SYR CONTROL 10ML LL (SYRINGE) ×2 IMPLANT
TRAY DSU PREP LF (CUSTOM PROCEDURE TRAY) ×2 IMPLANT
TUBE CONNECTING 20X1/4 (TUBING) IMPLANT

## 2013-02-06 NOTE — H&P (Signed)
Elaine Carter is an 72 y.o. female.   Chief Complaint: Left arm lipoma HPI: The patient is a 72 yrs old wf here for treatment of a left arm lipoma.  It has been present for over a year and getting larger.  She is otherwise in good health.  She had a lymph node dissection on that side years ago but all were negative.    Past Medical History  Diagnosis Date  . Hypertension   . GERD (gastroesophageal reflux disease)   . Arthritis   . Constipation   . Diverticular disease   . Contact lens/glasses fitting     wears contacts or glasses    Past Surgical History  Procedure Laterality Date  . Abdominal hysterectomy  2012    vag hystBSO  . Anterior and posterior repair  2012    bladder tape sliong-cysto along with hyst  . Breast biopsy  1998    lt  . Breast lumpectomy  1998    left  . Tubal ligation    . Colonoscopy      x3    History reviewed. No pertinent family history. Social History:  reports that she quit smoking about 14 years ago. She does not have any smokeless tobacco history on file. She reports that  drinks alcohol. She reports that she does not use illicit drugs.  Allergies:  Allergies  Allergen Reactions  . Codeine Nausea And Vomiting  . Tape Itching    Medications Prior to Admission  Medication Sig Dispense Refill  . calcium citrate-vitamin D (CITRACAL+D) 315-200 MG-UNIT per tablet Take 1 tablet by mouth 2 (two) times daily.      . chlorpheniramine (CHLOR-TRIMETON) 4 MG tablet Take 4 mg by mouth 2 (two) times daily as needed for allergies.      . cholecalciferol (VITAMIN D) 1000 UNITS tablet Take 1,000 Units by mouth daily. 2000u      . diclofenac (VOLTAREN) 75 MG EC tablet Take 75 mg by mouth 2 (two) times daily.      Marland Kitchen diltiazem (CARDIZEM CD) 180 MG 24 hr capsule Take 180 mg by mouth daily.      . indapamide (LOZOL) 2.5 MG tablet Take 2.5 mg by mouth every morning.      Marland Kitchen losartan (COZAAR) 100 MG tablet Take 100 mg by mouth daily.      Marland Kitchen omeprazole (PRILOSEC)  20 MG capsule Take 20 mg by mouth daily.      . polycarbophil (FIBERCON) 625 MG tablet Take 625 mg by mouth as needed.        Results for orders placed during the hospital encounter of 02/06/13 (from the past 48 hour(s))  BASIC METABOLIC PANEL     Status: Abnormal   Collection Time    02/04/13 12:00 PM      Result Value Range   Sodium 134 (*) 135 - 145 mEq/L   Potassium 3.5  3.5 - 5.1 mEq/L   Chloride 96  96 - 112 mEq/L   CO2 27  19 - 32 mEq/L   Glucose, Bld 119 (*) 70 - 99 mg/dL   BUN 22  6 - 23 mg/dL   Creatinine, Ser 1.61  0.50 - 1.10 mg/dL   Calcium 9.4  8.4 - 09.6 mg/dL   GFR calc non Af Amer 86 (*) >90 mL/min   GFR calc Af Amer >90  >90 mL/min   Comment:            The eGFR has been calculated  using the CKD EPI equation.     This calculation has not been     validated in all clinical     situations.     eGFR's persistently     <90 mL/min signify     possible Chronic Kidney Disease.  POCT HEMOGLOBIN-HEMACUE     Status: None   Collection Time    02/06/13  7:16 AM      Result Value Range   Hemoglobin 12.6  12.0 - 15.0 g/dL   No results found.  Review of Systems  Constitutional: Negative.   HENT: Negative.   Eyes: Negative.   Respiratory: Negative.   Cardiovascular: Negative.   Gastrointestinal: Negative.   Genitourinary: Negative.   Musculoskeletal: Negative.   Skin: Negative.   Psychiatric/Behavioral: Negative.     Blood pressure 160/74, pulse 64, temperature 98.2 F (36.8 C), temperature source Oral, resp. rate 16, height 5\' 1"  (1.549 m), weight 68.04 kg (150 lb), SpO2 98.00%. Physical Exam  Constitutional: She appears well-developed and well-nourished.  HENT:  Head: Normocephalic and atraumatic.  Eyes: Conjunctivae and EOM are normal. Pupils are equal, round, and reactive to light.  Cardiovascular: Normal rate.   Respiratory: Effort normal.  Musculoskeletal: Normal range of motion.  Neurological: She is alert.  Skin: Skin is warm.      Assessment/Plan Left arm lipoma - excision of lipoma  SANGER,Eduardo Wurth 02/06/2013, 7:31 AM

## 2013-02-06 NOTE — Anesthesia Postprocedure Evaluation (Signed)
Anesthesia Post Note  Patient: Elaine Carter  Procedure(s) Performed: Procedure(s) (LRB): EXCISION OF LEFT UPPER ARM LIPOMA (Left)  Anesthesia type: general  Patient location: PACU  Post pain: Pain level controlled  Post assessment: Patient's Cardiovascular Status Stable  Last Vitals:  Filed Vitals:   02/06/13 1015  BP: 139/51  Pulse: 65  Temp: 36.6 C  Resp: 16    Post vital signs: Reviewed and stable  Level of consciousness: sedated  Complications: No apparent anesthesia complications

## 2013-02-06 NOTE — Transfer of Care (Signed)
Immediate Anesthesia Transfer of Care Note  Patient: Elaine Carter  Procedure(s) Performed: Procedure(s): EXCISION OF LEFT UPPER ARM LIPOMA (Left)  Patient Location: PACU  Anesthesia Type:General  Level of Consciousness: awake, alert  and oriented  Airway & Oxygen Therapy: Patient Spontanous Breathing and Patient connected to face mask oxygen  Post-op Assessment: Report given to PACU RN and Post -op Vital signs reviewed and stable  Post vital signs: Reviewed and stable  Complications: No apparent anesthesia complications

## 2013-02-06 NOTE — Op Note (Signed)
Operative Note   SURGICAL DIVISION: Plastic Surgery  PREOPERATIVE DIAGNOSES:  Left arm lipoma  POSTOPERATIVE DIAGNOSES:  Left arm lipoma  PROCEDURE:  Excision of left arm lipoma 6 x 6 cm  SURGEON: Moksha Dorgan Sanger, DO  ANESTHESIA:  General.   COMPLICATIONS: None.   INDICATIONS FOR PROCEDURE:  The patient is a 72 yrs old wf with a growing left arm lipoma.   CONSENT:  Informed consent was obtained directly from the patient. Risks, benefits and alternatives were fully discussed. Specific risks including but not limited to bleeding, infection, hematoma, seroma, scarring, pain, implant infection, implant extrusion, capsular contracture, asymmetry, wound healing problems, and need for further surgery were all discussed. The patient did have an ample opportunity to have her questions answered to her satisfaction.   DESCRIPTION OF PROCEDURE:  The patient was taken to the operating room. SCDs were placed and IV antibiotics were given. The patient's operative site was prepped and draped in a sterile fashion. A time out was performed and all information was confirmed to be correct.  Marcaine with epinepherine was injected around the area and at the skin level at the incision site.  After waiting several minutes for the epinepherine to take effect the skin was incised with a #15 blade.  Blunt dissection was used to free the fat from the surrounding tissue.  Hemostasis was achieved with electrocautery.  The area was irrigated with normal saline.  The deep layers were closed with 4-0 Vicryl followed by a 5-0 Monocryl.  Dermabond and steri strips were applied with a light pressure dressing.  The patient was allowed to wake from anesthesia, extubated and taken to the recovery room in satisfactory condition.

## 2013-02-06 NOTE — Brief Op Note (Signed)
02/06/2013  8:39 AM  PATIENT:  Elaine Carter  72 y.o. female  PRE-OPERATIVE DIAGNOSIS:  Left Arm Lipoma  POST-OPERATIVE DIAGNOSIS:  Left Arm Lipoma  PROCEDURE:  Procedure(s): EXCISION OF LEFT UPPER ARM LIPOMA (Left)  SURGEON:  Surgeon(s) and Role:    * Geanna Divirgilio Sanger, DO - Primary  PHYSICIAN ASSISTANT: none  ASSISTANTS: none   ANESTHESIA:   general  EBL:  Total I/O In: 1100 [I.V.:1100] Out: -   BLOOD ADMINISTERED:none  DRAINS: none   LOCAL MEDICATIONS USED:  MARCAINE     SPECIMEN:  Source of Specimen:  left arm lipoma  DISPOSITION OF SPECIMEN:  PATHOLOGY  COUNTS:  YES  TOURNIQUET:  * No tourniquets in log *  DICTATION: .Dragon Dictation  PLAN OF CARE: Discharge to home after PACU  PATIENT DISPOSITION:  PACU - hemodynamically stable.   Delay start of Pharmacological VTE agent (>24hrs) due to surgical blood loss or risk of bleeding: no

## 2013-02-06 NOTE — Anesthesia Procedure Notes (Signed)
Procedure Name: LMA Insertion Date/Time: 02/06/2013 7:46 AM Performed by: Zenia Resides D Pre-anesthesia Checklist: Patient identified, Emergency Drugs available, Suction available and Patient being monitored Patient Re-evaluated:Patient Re-evaluated prior to inductionOxygen Delivery Method: Circle System Utilized Preoxygenation: Pre-oxygenation with 100% oxygen Intubation Type: IV induction Ventilation: Mask ventilation without difficulty LMA: LMA inserted LMA Size: 4.0 Number of attempts: 1 Airway Equipment and Method: bite block Placement Confirmation: positive ETCO2 and CO2 detector Tube secured with: Tape Dental Injury: Teeth and Oropharynx as per pre-operative assessment

## 2013-02-06 NOTE — Anesthesia Preprocedure Evaluation (Addendum)
Anesthesia Evaluation  Patient identified by MRN, date of birth, ID band Patient awake    Reviewed: Allergy & Precautions, H&P , NPO status , Patient's Chart, lab work & pertinent test results  Airway Mallampati: I TM Distance: >3 FB Neck ROM: Full    Dental   Pulmonary neg pulmonary ROS,          Cardiovascular hypertension, Pt. on medications     Neuro/Psych negative neurological ROS     GI/Hepatic negative GI ROS, Neg liver ROS, GERD-  Medicated and Controlled,  Endo/Other  negative endocrine ROS  Renal/GU negative Renal ROS  negative genitourinary   Musculoskeletal negative musculoskeletal ROS (+)   Abdominal   Peds negative pediatric ROS (+)  Hematology negative hematology ROS (+)   Anesthesia Other Findings   Reproductive/Obstetrics negative OB ROS                         Anesthesia Physical Anesthesia Plan  ASA: II  Anesthesia Plan: General   Post-op Pain Management:    Induction: Intravenous  Airway Management Planned: LMA  Additional Equipment:   Intra-op Plan:   Post-operative Plan: Extubation in OR  Informed Consent: I have reviewed the patients History and Physical, chart, labs and discussed the procedure including the risks, benefits and alternatives for the proposed anesthesia with the patient or authorized representative who has indicated his/her understanding and acceptance.     Plan Discussed with: CRNA and Surgeon  Anesthesia Plan Comments: (Scopolamine Patch for PONV)        Anesthesia Quick Evaluation

## 2013-02-07 ENCOUNTER — Encounter (HOSPITAL_BASED_OUTPATIENT_CLINIC_OR_DEPARTMENT_OTHER): Payer: Self-pay | Admitting: Plastic Surgery

## 2013-03-20 ENCOUNTER — Ambulatory Visit
Admission: RE | Admit: 2013-03-20 | Discharge: 2013-03-20 | Disposition: A | Discharge status: Home / Self Care | Payer: Medicare Other | Source: Ambulatory Visit

## 2013-03-20 DIAGNOSIS — Z853 Personal history of malignant neoplasm of breast: Secondary | ICD-10-CM

## 2013-03-20 DIAGNOSIS — Z1231 Encounter for screening mammogram for malignant neoplasm of breast: Secondary | ICD-10-CM

## 2013-03-20 DIAGNOSIS — Z9889 Other specified postprocedural states: Secondary | ICD-10-CM

## 2014-03-06 ENCOUNTER — Other Ambulatory Visit: Payer: Self-pay

## 2014-03-06 DIAGNOSIS — Z1231 Encounter for screening mammogram for malignant neoplasm of breast: Secondary | ICD-10-CM

## 2014-03-25 ENCOUNTER — Ambulatory Visit: Payer: Medicare Other

## 2014-04-01 ENCOUNTER — Ambulatory Visit: Payer: Medicare Other

## 2014-04-10 ENCOUNTER — Ambulatory Visit: Payer: Medicare Other

## 2014-04-21 ENCOUNTER — Ambulatory Visit
Admission: RE | Admit: 2014-04-21 | Discharge: 2014-04-21 | Disposition: A | Discharge status: Home / Self Care | Payer: 59 | Source: Ambulatory Visit

## 2014-04-21 DIAGNOSIS — Z1231 Encounter for screening mammogram for malignant neoplasm of breast: Secondary | ICD-10-CM

## 2015-04-07 ENCOUNTER — Other Ambulatory Visit: Payer: Self-pay

## 2015-04-07 DIAGNOSIS — Z1231 Encounter for screening mammogram for malignant neoplasm of breast: Secondary | ICD-10-CM

## 2015-05-12 ENCOUNTER — Ambulatory Visit
Admission: RE | Admit: 2015-05-12 | Discharge: 2015-05-12 | Disposition: A | Discharge status: Home / Self Care | Payer: Medicare Other | Source: Ambulatory Visit

## 2015-05-12 DIAGNOSIS — Z1231 Encounter for screening mammogram for malignant neoplasm of breast: Secondary | ICD-10-CM

## 2016-04-10 ENCOUNTER — Other Ambulatory Visit: Payer: Self-pay | Admitting: Family Medicine

## 2016-04-10 DIAGNOSIS — Z1231 Encounter for screening mammogram for malignant neoplasm of breast: Secondary | ICD-10-CM

## 2016-05-16 ENCOUNTER — Ambulatory Visit
Admission: RE | Admit: 2016-05-16 | Discharge: 2016-05-16 | Disposition: A | Discharge status: Home / Self Care | Payer: Medicare Other | Source: Ambulatory Visit | Attending: Family Medicine | Admitting: Family Medicine

## 2016-05-16 DIAGNOSIS — Z1231 Encounter for screening mammogram for malignant neoplasm of breast: Secondary | ICD-10-CM

## 2016-06-28 ENCOUNTER — Encounter: Payer: Self-pay | Admitting: Genetic Counselor

## 2016-06-28 ENCOUNTER — Telehealth: Payer: Self-pay | Admitting: Genetic Counselor

## 2016-06-28 NOTE — Telephone Encounter (Signed)
Pt confirmed appt, verified demo and insurance, mailed pt letter, faxed referring provider appt date/time. °

## 2016-08-08 ENCOUNTER — Other Ambulatory Visit: Payer: Medicare Other

## 2016-08-08 ENCOUNTER — Encounter: Payer: Medicare Other | Admitting: Genetic Counselor

## 2016-08-10 ENCOUNTER — Other Ambulatory Visit: Payer: Medicare Other

## 2016-08-10 ENCOUNTER — Ambulatory Visit (HOSPITAL_BASED_OUTPATIENT_CLINIC_OR_DEPARTMENT_OTHER): Payer: Medicare Other | Admitting: Genetic Counselor

## 2016-08-10 ENCOUNTER — Encounter: Payer: Self-pay | Admitting: Genetic Counselor

## 2016-08-10 DIAGNOSIS — Z315 Encounter for genetic counseling: Secondary | ICD-10-CM | POA: Diagnosis not present

## 2016-08-10 DIAGNOSIS — Z853 Personal history of malignant neoplasm of breast: Secondary | ICD-10-CM

## 2016-08-10 DIAGNOSIS — Z809 Family history of malignant neoplasm, unspecified: Secondary | ICD-10-CM | POA: Insufficient documentation

## 2016-08-10 NOTE — Progress Notes (Signed)
[facility]   Patient Name: Elaine Carter Patient DOB: 12-25-1940 Encounter Date: 08/10/2016  Referring Provider: Gaynelle Arabian, MD [address] [place], [place] [address]  Primary Care Provider: Simona Huh, MD  Reason for Visit: Evaluate for hereditary susceptibility to cancer  Ms. Elaine Carter, a 76 y.o. female, is being seen at the [facility] due to a personal and family history of cancer. She presents to clinic today to discuss the possibility of a hereditary predisposition to cancer and discuss whether genetic testing is warranted.  History of Present Illness: Ms. Elaine Carter was diagnosed with left breast cancer at the age of 53. She is s/p lumpectomy, radiation and Tamoxifen.     Past Medical History:  Diagnosis Date  . Arthritis   . Constipation   . Contact lens/glasses fitting    wears contacts or glasses  . Diverticular disease   . Family history of cancer   . GERD (gastroesophageal reflux disease)   . Hypertension   . Personal history of breast cancer 1998   left breast; estrogen positive    Past Surgical History:  Procedure Laterality Date  . ABDOMINAL HYSTERECTOMY  2012   vag hystBSO  . ANTERIOR AND POSTERIOR REPAIR  2012   bladder tape sliong-cysto along with hyst  . BREAST BIOPSY  1998   lt  . BREAST LUMPECTOMY  1998   left  . COLONOSCOPY     x3  . LIPOMA EXCISION Left 02/06/2013   Procedure: EXCISION OF LEFT UPPER ARM LIPOMA;  Surgeon: Theodoro Kos, DO;  Location: [facility];  Service: Plastics;  Laterality: Left;  . TUBAL LIGATION      Social History   Social History  . Marital status: Widowed    Spouse name: N/A  . Number of children: N/A  . Years of education: N/A   Social History Main Topics  . Smoking status: Former Smoker    Quit date: 02/04/1999  . Smokeless tobacco: Not on file  . Alcohol use Yes     Comment: 2 wines daily  . Drug use:  No  . Sexual activity: Not on file   Other Topics Concern  . Not on file   Social History Narrative  . No narrative on file     Family History:  During the visit, a 4-generation pedigree was obtained. Family tree will be scanned in the Media tab in Epic  Significant diagnoses include the following:  Family History  Problem Relation Age of Onset  . Prostate cancer Maternal Uncle     Dx 26s; deceased 37s  . Stomach cancer Paternal Uncle     Dx 46s; deceased 1  . Cancer Maternal Uncle     unk. type  . Cancer Maternal Uncle     throat ca (smoker); deceased 40s  . Leukemia Cousin     mat female 1st cousin; deceased 77  . Breast cancer Other     mat grandmother's sister    Additionally, Ms. Elaine Carter has two daughters (ages 10 and 7). She has two sisters (ages 48 and 20) and a brother (age 75); all cancer-free. Her mother died at 28, cancer-free. Her father died in his 58s and had non-melanoma skin cancer. Her father had a total of 4 brothers and no sisters.  Ms. Elaine Carter ancestry is Caucasian - NOS. There is no known Jewish ancestry and no consanguinity.  Assessment and Plan:  Ms. Elaine Carter is a 76 y.o. female with a personal and family history of cancer as noted above. This history is not suggestive of a hereditary predisposition to cancer given her very large family and her own age at diagnosis. Genetic testing is available, but she understood that her risk of harboring a mutation is not high. Testing will determine whether she has a pathogenic mutation that would impact her cancer screening and risk-reduction options. We reviewed the characteristics, features and inheritance patterns of hereditary cancer syndromes. We discussed the process of genetic testing, including insurance coverage and implications of results: positive, negative and Variant of Uncertain Significance. A negative result will be reassuring.   Ms. Elaine Carter wished to pursue genetic testing and a blood sample will be  sent for analysis of the 43 genes on [facility]'s Common Cancers panel (APC, ATM, AXIN2, BARD1, BMPR1A, BRCA1, BRCA2, BRIP1, CDH1, CDKN2A, CHEK2, DICER1, EPCAM, GREM1, HOXB13, KIT, MEN1, MLH1, MSH2, MSH6, MUTYH, NBN, NF1, PALB2, PDGFRA, PMS2, POLD1, POLE, PTEN, RAD50, RAD51C, RAD51D, SDHA, SDHB, SDHC, SDHD, SMAD4, SMARCA4, STK11, TP53, TSC1, TSC2, VHL). Results should be available in approximately 2-4 weeks, at which point we will contact her and address implications for her as well as address genetic testing for at-risk family members, if needed.    Ms. Elaine Carter is encouraged to remain in contact with Cancer Genetics annually so that we can update the family history and inform her of any changes in cancer genetics and testing that may be of benefit for this family. Ms.  Elaine Carter questions were answered to her satisfaction today and she is welcome to call with any additional questions or concerns. Thank you for the referral and allowing [place] to share in the care of your patient.   Dr. Jana Carter was available for questions concerning this case. Total time spent by Elaine Berg, MS, CGC in face-to-face counseling was approximately 35  minutes.   Elaine Berg, MS, [place] Certified Genetic Counselor phone: [phone] [name]   ______________________________________________________________________ For Office Staff:  Number of people involved in session: 1 Was an Intern/ student involved with case: no

## 2016-09-01 ENCOUNTER — Encounter: Payer: Self-pay | Admitting: Genetic Counselor

## 2016-09-01 ENCOUNTER — Ambulatory Visit: Payer: Self-pay | Admitting: Genetic Counselor

## 2016-09-01 DIAGNOSIS — Z1379 Encounter for other screening for genetic and chromosomal anomalies: Secondary | ICD-10-CM

## 2016-09-01 DIAGNOSIS — Z1509 Genetic susceptibility to other malignant neoplasm: Principal | ICD-10-CM

## 2016-09-01 DIAGNOSIS — Z1502 Genetic susceptibility to malignant neoplasm of ovary: Principal | ICD-10-CM

## 2016-09-01 DIAGNOSIS — Z1501 Genetic susceptibility to malignant neoplasm of breast: Secondary | ICD-10-CM | POA: Insufficient documentation

## 2016-09-01 DIAGNOSIS — Z1589 Genetic susceptibility to other disease: Principal | ICD-10-CM

## 2016-09-01 HISTORY — DX: Encounter for other screening for genetic and chromosomal anomalies: Z13.79

## 2016-09-01 NOTE — Progress Notes (Signed)
[facility]    Patient Name: Elaine Carter Patient DOB: 01-Jan-1941 Patient Age: 76 y.o. Encounter Date: 09/01/2016  Referring Provider: Gaynelle Arabian, MD  Primary Care Provider: Simona Huh, MD   Elaine Carter was called today to discuss genetic test results. Please see the Genetics note from her visit on 08/10/2016 for a detailed discussion of her personal and family history.  Genetic Testing: At the time of Elaine Carter's visit, we recommended she pursue genetic testing of multiple genes associated with hereditary predisposition to cancer. Testing included sequencing and deletion/duplication analysis. 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  A copy of the genetic test report will be scanned into Epic under the media tab.  The genes analyzed were the 43 genes on [facility]'s Common Cancers panel (APC, ATM, AXIN2, BARD1, BMPR1A, BRCA1, BRCA2, BRIP1, CDH1, CDKN2A, CHEK2, DICER1, EPCAM, GREM1, HOXB13, KIT, MEN1, MLH1, MSH2, MSH6, MUTYH, NBN, NF1, PALB2, PDGFRA, PMS2, POLD1, POLE, PTEN, RAD50, RAD51C, RAD51D, SDHA, SDHB, SDHC, SDHD, SMAD4, SMARCA4, STK11, TP53, TSC1, TSC2, VHL).  CHEK2 Cancer Risks: It is important to note that the studies on cancer risk associated with the CHEK2 gene are generally limited to a single CHEK2 mutation that is common in the [place] population. For this reason, the cancer risk estimates below are likely to change as new data is obtained about other CHEK2 mutations, such as the one found in Elaine Carter.  We discussed that pathogenic mutations in CHEK2 have been shown to increase the risk of breast cancer to 20-44% by age 108 (Cybulski 2011, Gilliam 2008). The risk has been shown to be dependent on the family history of breast cancer. CHEK2 mutations have also been associated with an increased risk of colorectal cancer, but specific risk estimates are not fully defined yet.  There are other conflicting studies suggesting that mutations in CHEK2 increase the risk for various other cancers, but results have not been consistent. CHEK2 mutations have been associated with an increased risk of prostate cancer in men.  Medical Management: We discussed the medical management guidelines from the [place] (NCCN) for women with a CHEK2 mutation.   Breast Cancer (NCCN V1.2018): Starting at age 61, or 5-10 years prior to the earliest diagnosis of breast cancer in the family (whichever is earlier): Annual mammogram with consideration of tomosynthesis and consideration of breast MRI with contrast. Women are recommended to be followed by a breast health specialist for a clinical breast exam twice per year. There is insufficient evidence at this time to recommend risk-reducing bilateral mastectomies, but this remains a choice that some women may pursue based on their family history.  Colon Cancer: (NCCN N5.6213): For those without a personal history of colorectal cancer and no first-degree relative with colorectal cancer, colonoscopy screening every 5 years, beginning at age 43. This may need to be more frequent if polyps are found.  Other: Elaine Carter is recommended to continue having a yearly physical and/or gynecologic exam and continue to follow population screening guidelines for other cancers.  Family Members: It is important that Elaine Carter informs her relatives of this result. Each of her siblings and children has a 50% chance to have inherited this mutation. They are recommended to speak with a genetic counselor prior to any testing. A genetic counselor can be located at [email or web address].   We encouraged Elaine Carter to remain in contact with [place]  on an annual basis so we can update her personal and family histories, and let her know of advances in cancer genetics that may benefit the family. Our contact number was provided. Elaine Carter questions were answered  to her satisfaction today, and she knows she is welcome to call anytime with additional questions.     [name], MS, [place] Certified Genetic Counselor phone: [phone]

## 2016-09-04 ENCOUNTER — Telehealth: Payer: Self-pay | Admitting: Genetic Counselor

## 2016-09-04 NOTE — Telephone Encounter (Signed)
Phone just rings, when called to coordinate genetic testing for family with pt.

## 2016-09-07 ENCOUNTER — Encounter: Payer: Self-pay | Admitting: Hematology and Oncology

## 2016-09-14 ENCOUNTER — Ambulatory Visit: Payer: Self-pay | Admitting: Genetic Counselor

## 2016-09-14 NOTE — Progress Notes (Signed)
error 

## 2016-10-09 ENCOUNTER — Telehealth: Payer: Self-pay | Admitting: Hematology and Oncology

## 2016-10-09 ENCOUNTER — Encounter: Payer: Medicare Other | Admitting: Hematology and Oncology

## 2016-10-09 NOTE — Telephone Encounter (Signed)
Pt cld to reschedule appt due to illness. Rescheduled to see Dr. Lindi Adie on 3/19

## 2016-10-09 NOTE — Assessment & Plan Note (Deleted)
CHEK 2 c.591delA (p.Val198Phefs*7) @ [facility]: I discussed with her that based on the [facility] report. Based on NCCN guidelines, this is a pathogenic mutation that has been associated with risk of not only breast cancer but also colon, Prostate, thyroid and kidney cancers.   Pathogenesis: I discussed with the patient that CHEK-2 encodes for a serine-threonine tyrosine kinase involved in the DNA repair in combination with ATM, BRCA1, P53 genes. Patients with mutation of the CHEK-2 gene results in the damaged DNA getting replicated and increase the patient's risk for cancers.   Surveillance: I recommended annual mammograms and annual breast MRIs combined with monthly self breast examinations and breast examinations with her physician. Patient would like to continue the surveillance plan under the care of her gynecologist. I also discussed different other options including prophylactic bilateral mastectomies. Based on NCCN guidelines, there is no data to support the role of prophylactic bilateral mastectomy for CHEK-2 mutation. However given her familial history, if she chooses to undergo bilateral mastectomy it would not be unreasonable. Lifetime risk of breast cancer can vary from 28% to 38%. Higher with family history of breast cancer.   Other cancer surveillance: Apart of breast cancer, there are no definite surveillance approaches to kidney cancer. For colon cancer she will need a colonoscopy at age 52 since there is no family history and for thyroid cancer she will need manual thyroid examinations for any lumps or nodules.  Return to clinic once a year for examinations. We will order her annual mammograms and MRIs that can be done 6 months apart

## 2016-10-16 ENCOUNTER — Encounter: Payer: Self-pay | Admitting: Hematology and Oncology

## 2016-10-16 ENCOUNTER — Ambulatory Visit (HOSPITAL_BASED_OUTPATIENT_CLINIC_OR_DEPARTMENT_OTHER): Payer: Medicare Other | Admitting: Hematology and Oncology

## 2016-10-16 DIAGNOSIS — Z1501 Genetic susceptibility to malignant neoplasm of breast: Secondary | ICD-10-CM

## 2016-10-16 DIAGNOSIS — Z1589 Genetic susceptibility to other disease: Secondary | ICD-10-CM

## 2016-10-16 DIAGNOSIS — Z1502 Genetic susceptibility to malignant neoplasm of ovary: Principal | ICD-10-CM

## 2016-10-16 DIAGNOSIS — Z1509 Genetic susceptibility to other malignant neoplasm: Secondary | ICD-10-CM | POA: Diagnosis not present

## 2016-10-16 NOTE — Progress Notes (Signed)
[facility] CONSULT NOTE  Patient Care Team: Gaynelle Arabian, MD as PCP - General (Family Medicine)  CHIEF COMPLAINTS/PURPOSE OF CONSULTATION:  Newly diagnosed ATM gene mutation  HISTORY OF PRESENTING ILLNESS:  Elaine Carter 76 y.o. female is here because of recent diagnosis of ATM gene mutation. Patient had a history of breast cancer 1998 diagnosed at [facility] that was treated with surgery followed by radiation and 5 years of tamoxifen. She moved to [facility] during that time and was seen by Dr. Malachy Mood who completed her 5 years of antiestrogen therapy and released her for follow-up with her primary care physician. Her daughter was informed by her physician that her patient needs to be tested for genetics based upon extensive family history. Because of this she saw genetics counselor and had genetics testing done that revealed a pathogenic mutation in the ATM gene. She was sent to [place] for discussion regarding the surveillance strategy for this gene.  I reviewed her records extensively and collaborated the history with the patient.  MEDICAL HISTORY:  Past Medical History:  Diagnosis Date  . Arthritis   . Constipation   . Contact lens/glasses fitting    wears contacts or glasses  . Diverticular disease   . Family history of cancer   . Genetic testing 09/01/2016   Test results: 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  Genes tested: 43 genes on [facility]'s Common Cancers panel (APC, ATM, AXIN2, BARD1, BMPR1A, BRCA1, BRCA2, BRIP1, CDH1, CDKN2A, CHEK2, DICER1, EPCAM, GREM1, HOXB13, KIT, MEN1, MLH1, MSH2, MSH6, MUTYH, NBN, NF1, PALB2, PDGFRA, PMS2, POLD1, POLE, PTEN, RAD50, RAD51C, RAD51D, SDHA, SDHB, SDHC, SDHD, SMAD4, SMARCA4,   . GERD (gastroesophageal reflux disease)   . Hypertension   . Monoallelic mutation of CHEK2 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    Pathogenic CHEK2 mutation c.591delA (p.Val198Phefs*7) @ [facility]  . Personal history of breast  cancer 1998   left breast; estrogen positive    SURGICAL HISTORY: Past Surgical History:  Procedure Laterality Date  . ABDOMINAL HYSTERECTOMY  2012   vag hystBSO  . ANTERIOR AND POSTERIOR REPAIR  2012   bladder tape sliong-cysto along with hyst  . BREAST BIOPSY  1998   lt  . BREAST LUMPECTOMY  1998   left  . COLONOSCOPY     x3  . LIPOMA EXCISION Left 02/06/2013   Procedure: EXCISION OF LEFT UPPER ARM LIPOMA;  Surgeon: Theodoro Kos, DO;  Location: [facility];  Service: Plastics;  Laterality: Left;  . TUBAL LIGATION      SOCIAL HISTORY: Social History   Social History  . Marital status: Widowed    Spouse name: N/A  . Number of children: N/A  . Years of education: N/A   Occupational History  . Not on file.   Social History Main Topics  . Smoking status: Former Smoker    Quit date: 02/04/1999  . Smokeless tobacco: Not on file  . Alcohol use Yes     Comment: 2 wines daily  . Drug use: No  . Sexual activity: Not on file   Other Topics Concern  . Not on file   Social History Narrative  . No narrative on file    FAMILY HISTORY: Family History  Problem Relation Age of Onset  . Prostate cancer Maternal Uncle     Dx 86s; deceased 27s  . Stomach cancer Paternal Uncle     Dx 16s; deceased 46  . Cancer Maternal Uncle     unk. type  .  Cancer Maternal Uncle     throat ca (smoker); deceased 38s  . Leukemia Cousin     mat female 1st cousin; deceased 73  . Breast cancer Other     mat grandmother's sister    ALLERGIES:  is allergic to codeine and tape.  MEDICATIONS:  Current Outpatient Prescriptions  Medication Sig Dispense Refill  . calcium citrate-vitamin D (CITRACAL+D) 315-200 MG-UNIT per tablet Take 1 tablet by mouth 2 (two) times daily.    . chlorpheniramine (CHLOR-TRIMETON) 4 MG tablet Take 4 mg by mouth 2 (two) times daily as needed for allergies.    . cholecalciferol (VITAMIN D) 1000 UNITS tablet Take 1,000 Units by mouth daily. 2000u    .  diclofenac (VOLTAREN) 75 MG EC tablet Take 75 mg by mouth 2 (two) times daily.    Marland Kitchen diltiazem (CARDIZEM CD) 180 MG 24 hr capsule Take 180 mg by mouth daily.    . indapamide (LOZOL) 2.5 MG tablet Take 2.5 mg by mouth every morning.    Marland Kitchen losartan (COZAAR) 100 MG tablet Take 100 mg by mouth daily.    Marland Kitchen omeprazole (PRILOSEC) 20 MG capsule Take 20 mg by mouth daily.    . polycarbophil (FIBERCON) 625 MG tablet Take 625 mg by mouth as needed.     No current facility-administered medications for this visit.     REVIEW OF SYSTEMS:   Constitutional: Denies fevers, chills or abnormal night sweats Eyes: Denies blurriness of vision, double vision or watery eyes Ears, nose, mouth, throat, and face: Denies mucositis or sore throat Respiratory: Denies cough, dyspnea or wheezes Cardiovascular: Denies palpitation, chest discomfort or lower extremity swelling Gastrointestinal:  Denies nausea, heartburn or change in bowel habits Skin: Denies abnormal skin rashes Lymphatics: Denies new lymphadenopathy or easy bruising Neurological:Denies numbness, tingling or new weaknesses Behavioral/Psych: Mood is stable, no new changes  Breast:  Denies any palpable lumps or discharge All other systems were reviewed with the patient and are negative.  PHYSICAL EXAMINATION: ECOG PERFORMANCE STATUS: 1 - Symptomatic but completely ambulatory  Vitals:   10/16/16 1536  BP: 128/86  Pulse: 63  Resp: 17  Temp: 97.5 F (36.4 C)   Filed Weights   10/16/16 1536  Weight: 128 lb 4.8 oz (58.2 kg)    GENERAL:alert, no distress and comfortable SKIN: skin color, texture, turgor are normal, no rashes or significant lesions EYES: normal, conjunctiva are pink and non-injected, sclera clear OROPHARYNX:no exudate, no erythema and lips, buccal mucosa, and tongue normal  NECK: supple, thyroid normal size, non-tender, without nodularity LYMPH:  no palpable lymphadenopathy in the cervical, axillary or inguinal LUNGS: clear to  auscultation and percussion with normal breathing effort HEART: regular rate & rhythm and no murmurs and no lower extremity edema ABDOMEN:abdomen soft, non-tender and normal bowel sounds Musculoskeletal:no cyanosis of digits and no clubbing  PSYCH: alert & oriented x 3 with fluent speech NEURO: no focal motor/sensory deficits  LABORATORY DATA:  I have reviewed the data as listed Lab Results  Component Value Date   WBC 13.1 (H) 10/12/2010   HGB 12.6 02/06/2013   HCT 30.0 (L) 10/12/2010   MCV 92.9 10/12/2010   PLT 235 10/12/2010   Lab Results  Component Value Date   NA 134 (L) 02/04/2013   K 3.5 02/04/2013   CL 96 02/04/2013   CO2 27 02/04/2013    RADIOGRAPHIC STUDIES: I have personally reviewed the radiological reports and agreed with the findings in the report.  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  Based on NCCN guidelines, this is a pathogenic mutation that has been associated with risk of not only breast cancer but also colon, thyroid and kidney cancers.   Pathogenesis: I discussed with the patient that CHEK-2 encodes for a serine-threonine tyrosine kinase involved in the DNA repair in combination with ATM, BRCA1, P53 genes. Patients with mutation of the CHEK-2 gene results in the damaged DNA getting replicated and increase the patient's risk for cancers.   Surveillance: I recommended annual mammograms and annual breast MRIs combined with monthly self breast examinations and breast examinations with her physician.  I also discussed different other options including prophylactic bilateral mastectomies. Based on NCCN guidelines, there is no data to support the role of prophylactic bilateral mastectomy for CHEK-2 mutation. Lifetime risk of breast cancer in vary from 28% to 38%. Higher with family history of breast cancer.   Other cancer surveillance: Apart of breast cancer, there are no definite  surveillance approaches to kidney cancer. For colon cancer she will need a colonoscopy  every 5 years. For thyroid cancer, she will need manual thyroid examinations for any lumps or nodules. Return to clinic in 1 year for follow-up. I will call her with the result of the breast MRI. Patient's daughters are going to be tested for genetics as well by their local providers.  All questions were answered. The patient knows to call the clinic with any problems, questions or concerns.    Rulon Eisenmenger, MD 10/16/16

## 2016-10-16 NOTE — Assessment & Plan Note (Signed)
CHEK2 gene called c.591delA (p.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  CHEK 2 p.T476M variant: I discussed with her that based on the [facility] genetics report. Based on NCCN guidelines, this is a pathogenic mutation that has been associated with risk of not only breast cancer but also colon, Prostate, thyroid and kidney cancers.   Pathogenesis: I discussed with the patient that CHEK-2 encodes for a serine-threonine tyrosine kinase involved in the DNA repair in combination with ATM, BRCA1, P53 genes. Patients with mutation of the CHEK-2 gene results in the damaged DNAgetting replicated and increase the patient's risk for cancers.   Surveillance: I recommended annual mammograms and annual breast MRIs combined with monthly self breast examinations and breast examinations with her physician.  I also discussed different other options including prophylactic bilateral mastectomies. Based on NCCN guidelines, there is no data to support the role of prophylactic bilateral mastectomy for CHEK-2 mutation. Lifetime risk of breast cancer in vary from 28% to 38%. Higher with family history of breast cancer.   Other cancer surveillance: Apart of breast cancer, there are no definite surveillance approaches to kidney cancer. For colon cancer she will need a colonoscopy at age 33. For thyroid cancer, she will need manual thyroid examinations for any lumps or nodules.

## 2016-10-17 NOTE — Progress Notes (Signed)
Received copy of genetics report from Roma Kayser (Dietitian). Faxed information to daughter as patient requested [facility] Fax# [phone]) Mailed a copy of genetics report to pt as requested.

## 2016-11-01 ENCOUNTER — Ambulatory Visit (HOSPITAL_COMMUNITY)
Admission: RE | Admit: 2016-11-01 | Discharge: 2016-11-01 | Disposition: A | Discharge status: Home / Self Care | Payer: Medicare Other | Source: Ambulatory Visit | Attending: Hematology and Oncology | Admitting: Hematology and Oncology

## 2016-11-01 DIAGNOSIS — R590 Localized enlarged lymph nodes: Secondary | ICD-10-CM | POA: Insufficient documentation

## 2016-11-01 DIAGNOSIS — Z1589 Genetic susceptibility to other disease: Secondary | ICD-10-CM | POA: Diagnosis present

## 2016-11-01 DIAGNOSIS — Z1502 Genetic susceptibility to malignant neoplasm of ovary: Secondary | ICD-10-CM | POA: Insufficient documentation

## 2016-11-01 DIAGNOSIS — Z1501 Genetic susceptibility to malignant neoplasm of breast: Secondary | ICD-10-CM

## 2016-11-01 DIAGNOSIS — Z1509 Genetic susceptibility to other malignant neoplasm: Secondary | ICD-10-CM | POA: Insufficient documentation

## 2016-11-01 LAB — POCT I-STAT CREATININE: Creatinine, Ser: 0.7 mg/dL (ref 0.44–1.00)

## 2016-11-01 MED ORDER — GADOBENATE DIMEGLUMINE 529 MG/ML IV SOLN
15.0000 mL | Freq: Once | INTRAVENOUS | Status: AC | PRN
  Administered 2016-11-01: 11 mL via INTRAVENOUS

## 2016-11-03 ENCOUNTER — Other Ambulatory Visit: Payer: Self-pay | Admitting: *Deleted

## 2016-11-03 DIAGNOSIS — R928 Other abnormal and inconclusive findings on diagnostic imaging of breast: Secondary | ICD-10-CM

## 2016-11-06 ENCOUNTER — Other Ambulatory Visit: Payer: Self-pay | Admitting: *Deleted

## 2016-11-06 ENCOUNTER — Telehealth: Payer: Self-pay | Admitting: Hematology and Oncology

## 2016-11-06 ENCOUNTER — Other Ambulatory Visit: Payer: Self-pay | Admitting: Hematology and Oncology

## 2016-11-06 DIAGNOSIS — Z1502 Genetic susceptibility to malignant neoplasm of ovary: Principal | ICD-10-CM

## 2016-11-06 DIAGNOSIS — Z1509 Genetic susceptibility to other malignant neoplasm: Principal | ICD-10-CM

## 2016-11-06 DIAGNOSIS — Z1501 Genetic susceptibility to malignant neoplasm of breast: Secondary | ICD-10-CM

## 2016-11-06 DIAGNOSIS — R928 Other abnormal and inconclusive findings on diagnostic imaging of breast: Secondary | ICD-10-CM

## 2016-11-06 DIAGNOSIS — Z1589 Genetic susceptibility to other disease: Principal | ICD-10-CM

## 2016-11-06 NOTE — Telephone Encounter (Signed)
I called the patient to discuss the results of the breast MRI. It showed a 1 cm left axillary lymph node. Based on the radiology recommendations, I ordered ultrasound and if necessary ultrasound guided biopsy will be performed.

## 2016-11-17 ENCOUNTER — Other Ambulatory Visit: Payer: Self-pay | Admitting: Hematology and Oncology

## 2016-11-17 ENCOUNTER — Ambulatory Visit
Admission: RE | Admit: 2016-11-17 | Discharge: 2016-11-17 | Disposition: A | Discharge status: Home / Self Care | Payer: Medicare Other | Source: Ambulatory Visit | Attending: Hematology and Oncology | Admitting: Hematology and Oncology

## 2016-11-17 ENCOUNTER — Other Ambulatory Visit (HOSPITAL_COMMUNITY)
Admission: RE | Admit: 2016-11-17 | Discharge: 2016-11-17 | Disposition: A | Discharge status: Home / Self Care | Payer: Medicare Other | Source: Ambulatory Visit | Attending: Diagnostic Radiology | Admitting: Diagnostic Radiology

## 2016-11-17 DIAGNOSIS — R928 Other abnormal and inconclusive findings on diagnostic imaging of breast: Secondary | ICD-10-CM

## 2016-11-17 DIAGNOSIS — R599 Enlarged lymph nodes, unspecified: Secondary | ICD-10-CM

## 2016-11-17 DIAGNOSIS — R898 Other abnormal findings in specimens from other organs, systems and tissues: Secondary | ICD-10-CM | POA: Diagnosis not present

## 2016-11-17 DIAGNOSIS — R590 Localized enlarged lymph nodes: Secondary | ICD-10-CM | POA: Insufficient documentation

## 2016-11-21 ENCOUNTER — Other Ambulatory Visit (HOSPITAL_COMMUNITY): Payer: Self-pay | Admitting: Family Medicine

## 2016-11-21 DIAGNOSIS — I739 Peripheral vascular disease, unspecified: Secondary | ICD-10-CM

## 2016-11-23 ENCOUNTER — Ambulatory Visit (HOSPITAL_COMMUNITY)
Admission: RE | Admit: 2016-11-23 | Discharge: 2016-11-23 | Disposition: A | Discharge status: Home / Self Care | Payer: Medicare Other | Source: Ambulatory Visit | Attending: Surgery | Admitting: Surgery

## 2016-11-23 ENCOUNTER — Ambulatory Visit (INDEPENDENT_AMBULATORY_CARE_PROVIDER_SITE_OTHER)
Admission: RE | Admit: 2016-11-23 | Discharge: 2016-11-23 | Disposition: A | Discharge status: Home / Self Care | Payer: Medicare Other | Source: Ambulatory Visit | Attending: Surgery | Admitting: Surgery

## 2016-11-23 DIAGNOSIS — I739 Peripheral vascular disease, unspecified: Secondary | ICD-10-CM

## 2016-11-23 DIAGNOSIS — I771 Stricture of artery: Secondary | ICD-10-CM | POA: Insufficient documentation

## 2016-12-15 ENCOUNTER — Encounter: Payer: Self-pay | Admitting: Vascular Surgery

## 2016-12-27 ENCOUNTER — Ambulatory Visit (INDEPENDENT_AMBULATORY_CARE_PROVIDER_SITE_OTHER): Payer: Medicare Other | Admitting: Vascular Surgery

## 2016-12-27 ENCOUNTER — Ambulatory Visit (HOSPITAL_COMMUNITY)
Admission: RE | Admit: 2016-12-27 | Discharge: 2016-12-27 | Disposition: A | Discharge status: Home / Self Care | Payer: Medicare Other | Source: Ambulatory Visit | Attending: Vascular Surgery | Admitting: Vascular Surgery

## 2016-12-27 ENCOUNTER — Encounter: Payer: Self-pay | Admitting: Vascular Surgery

## 2016-12-27 VITALS — BP 133/66 | HR 80 | Temp 98.4°F | Resp 16 | Ht 61.0 in | Wt 133.0 lb

## 2016-12-27 DIAGNOSIS — I6529 Occlusion and stenosis of unspecified carotid artery: Secondary | ICD-10-CM | POA: Diagnosis present

## 2016-12-27 DIAGNOSIS — R0989 Other specified symptoms and signs involving the circulatory and respiratory systems: Secondary | ICD-10-CM | POA: Insufficient documentation

## 2016-12-27 NOTE — Progress Notes (Signed)
Patient name: Elaine Carter MRN: [id number] DOB: [birth year] Sex: female   REASON FOR CONSULT:    Peripheral vascular disease. Consult is requested by Dr. Coral Ceo.  HPI:   Elaine Carter is a 76 y.o. female, who is referred for evaluation of peripheral vascular disease. She had been having some leg pain which prompted an arterial workup. The results of which are described below. She has mostly left knee pain and she's had previous work on her left knee. I do not get any clear-cut history of claudication. She denies any rest pain or history of nonhealing ulcers. She denies any previous history of DVT. She quit tobacco in 1999.  She denies any history of stroke, TIAs, expressive or receptive aphasia, or amaurosis fugax.  Past Medical History:  Diagnosis Date  . Arthritis   . Constipation   . Contact lens/glasses fitting    wears contacts or glasses  . Diverticular disease   . Family history of cancer   . Genetic testing 09/01/2016   Test results: 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  Genes tested: 43 genes on [facility]'s Common Cancers panel (APC, ATM, AXIN2, BARD1, BMPR1A, BRCA1, BRCA2, BRIP1, CDH1, CDKN2A, CHEK2, DICER1, EPCAM, GREM1, HOXB13, KIT, MEN1, MLH1, MSH2, MSH6, MUTYH, NBN, NF1, PALB2, PDGFRA, PMS2, POLD1, POLE, PTEN, RAD50, RAD51C, RAD51D, SDHA, SDHB, SDHC, SDHD, SMAD4, SMARCA4,   . GERD (gastroesophageal reflux disease)   . Hypertension   . Monoallelic mutation of CHEK2 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    Pathogenic CHEK2 mutation c.591delA (p.Val198Phefs*7) @ [facility]  . Personal history of breast cancer 1998   left breast; estrogen positive    Family History  Problem Relation Age of Onset  . Prostate cancer Maternal Uncle        Dx 34s; deceased 34s  . Stomach cancer Paternal Uncle        Dx 97s; deceased 68  . Cancer Maternal Uncle        unk. type  . Cancer Maternal Uncle        throat ca (smoker); deceased 57s  . Leukemia Cousin          mat female 1st cousin; deceased 40  . Breast cancer Other        mat grandmother's sister    SOCIAL HISTORY: Social History   Social History  . Marital status: Widowed    Spouse name: N/A  . Number of children: N/A  . Years of education: N/A   Occupational History  . Not on file.   Social History Main Topics  . Smoking status: Former Smoker    Quit date: 02/04/1999  . Smokeless tobacco: Never Used  . Alcohol use Yes     Comment: 2 wines daily  . Drug use: No  . Sexual activity: Not on file   Other Topics Concern  . Not on file   Social History Narrative  . No narrative on file    Allergies  Allergen Reactions  . Codeine Nausea And Vomiting  . Tape Itching    Current Outpatient Prescriptions  Medication Sig Dispense Refill  . amLODipine (NORVASC) 5 MG tablet Take 5 mg by mouth daily.    Marland Kitchen aspirin EC 81 MG tablet Take 81 mg by mouth daily.    . calcium citrate-vitamin D (CITRACAL+D) 315-200 MG-UNIT per tablet Take 1 tablet by mouth 2 (two) times daily.    . chlorpheniramine (CHLOR-TRIMETON) 4 MG tablet Take 4 mg by mouth 2 (two) times  daily as needed for allergies.    . cholecalciferol (VITAMIN D) 1000 UNITS tablet Take 1,000 Units by mouth daily. 2000u    . indapamide (LOZOL) 2.5 MG tablet Take 2.5 mg by mouth every morning.    Marland Kitchen losartan (COZAAR) 100 MG tablet Take 100 mg by mouth daily.    . meloxicam (MOBIC) 15 MG tablet Take 15 mg by mouth daily.    Marland Kitchen omeprazole (PRILOSEC) 20 MG capsule Take 20 mg by mouth daily.    . polycarbophil (FIBERCON) 625 MG tablet Take 625 mg by mouth as needed.     No current facility-administered medications for this visit.     REVIEW OF SYSTEMS:  '[X]'  denotes positive finding, '[ ]'  denotes negative finding Cardiac  Comments:  Chest pain or chest pressure:    Shortness of breath upon exertion:    Short of breath when lying flat:    Irregular heart rhythm:        Vascular    Pain in calf, thigh, or hip brought on by  ambulation:    Pain in feet at night that wakes you up from your sleep:     Blood clot in your veins:    Leg swelling:  X       Pulmonary    Oxygen at home:    Productive cough:     Wheezing:         Neurologic    Sudden weakness in arms or legs:     Sudden numbness in arms or legs:     Sudden onset of difficulty speaking or slurred speech:    Temporary loss of vision in one eye:     Problems with dizziness:         Gastrointestinal    Blood in stool:     Vomited blood:         Genitourinary    Burning when urinating:     Blood in urine:        Psychiatric    Major depression:         Hematologic    Bleeding problems:    Problems with blood clotting too easily:        Skin    Rashes or ulcers:        Constitutional    Fever or chills:     PHYSICAL EXAM:   Vitals:   12/27/16 1314  BP: 133/66  Pulse: 80  Resp: 16  Temp: 98.4 F (36.9 C)  TempSrc: Oral  SpO2: 98%  Weight: 133 lb (60.3 kg)  Height: '5\' 1"'  (1.549 m)    GENERAL: The patient is a well-nourished female, in no acute distress. The vital signs are documented above. CARDIAC: There is a regular rate and rhythm.  VASCULAR: She has a left carotid bruit. I do not appreciate a right carotid bruit. On the right side, she has a palpable femoral, popliteal, and diminished dorsalis pedis pulse. I cannot palpate a posterior tibial pulse. On the left side she has a palpable femoral, popliteal, dorsalis pedis, and posterior tibial pulse. She has no significant lower extremity swelling. She does have some hyperpigmentation consistent with chronic venous insufficiency. PULMONARY: There is good air exchange bilaterally without wheezing or rales. ABDOMEN: Soft and non-tender with normal pitched bowel sounds.  MUSCULOSKELETAL: There are no major deformities or cyanosis. NEUROLOGIC: No focal weakness or paresthesias are detected. SKIN: There are no ulcers or rashes noted. PSYCHIATRIC: The patient has a normal  affect.  DATA:  LOWER EXTREMITY ARTERIAL DOPPLER STUDY: I have reviewed the patient's lower extremity arterial Doppler study that was done on or 2618.  On the right side there is a biphasic posterior tibial signal and biphasic dorsalis pedis signal. ABI is 100%. Toe pressure is 127 mmHg.  On the left side there is a triphasic dorsalis pedis and posterior tibial signal. ABI is 100%. Toe pressure is 137 mmHg.  LOWER EXTREMITY ARTERIAL DUPLEX: I have reviewed the patient's lower extremity arterial duplex scan that was done on 11/23/2016. This shows a 50-70 or percent proximal to mid right superficial femoral artery stenosis. There is also a 50-74% left mid superficial femoral artery stenosis.  CAROTID DUPLEX: Given her left carotid bruit, I obtained a carotid duplex scan today. I have independently interpreted this study.  On the left side, which is the side of the bruit, there is no significant carotid stenosis. There is some mild stenosis in the external carotid artery which may potentially explain her bruit.  On the right side, there is no significant carotid stenosis.  Both vertebral arteries are patent with antegrade flow.  MEDICAL ISSUES:   BILATERAL INFRAINGUINAL ARTERIAL OCCLUSIVE DISEASE: Based on her duplex scan, she does have a moderate superficial femoral artery stenosis bilaterally. However, she is essentially asymptomatic except for pain in her left knee related to arthritis and previous surgery. She is asymptomatic with no significant claudication or rest pain. I've encouraged her to stay as active as possible. Fortunately, she is not a smoker.  LEFT CAROTID BRUIT: Her carotid duplex scan today was unremarkable. No further workup is needed. She is on aspirin.  Deitra Mayo [facility] [phone]

## 2017-03-23 ENCOUNTER — Encounter (HOSPITAL_COMMUNITY): Payer: Self-pay

## 2017-04-16 ENCOUNTER — Other Ambulatory Visit: Payer: Self-pay | Admitting: Family Medicine

## 2017-04-16 DIAGNOSIS — Z1239 Encounter for other screening for malignant neoplasm of breast: Secondary | ICD-10-CM

## 2017-05-17 ENCOUNTER — Other Ambulatory Visit: Payer: Self-pay | Admitting: Family Medicine

## 2017-05-17 ENCOUNTER — Ambulatory Visit
Admission: RE | Admit: 2017-05-17 | Discharge: 2017-05-17 | Disposition: A | Discharge status: Home / Self Care | Payer: Medicare Other | Source: Ambulatory Visit | Attending: Family Medicine | Admitting: Family Medicine

## 2017-05-17 DIAGNOSIS — Z853 Personal history of malignant neoplasm of breast: Secondary | ICD-10-CM

## 2017-05-17 DIAGNOSIS — N6320 Unspecified lump in the left breast, unspecified quadrant: Secondary | ICD-10-CM

## 2017-05-17 DIAGNOSIS — Z1239 Encounter for other screening for malignant neoplasm of breast: Secondary | ICD-10-CM

## 2017-10-15 ENCOUNTER — Ambulatory Visit (INDEPENDENT_AMBULATORY_CARE_PROVIDER_SITE_OTHER): Payer: Medicare Other | Admitting: Orthopedic Surgery

## 2017-10-15 ENCOUNTER — Ambulatory Visit (INDEPENDENT_AMBULATORY_CARE_PROVIDER_SITE_OTHER): Payer: Self-pay

## 2017-10-15 ENCOUNTER — Encounter (INDEPENDENT_AMBULATORY_CARE_PROVIDER_SITE_OTHER): Payer: Self-pay | Admitting: Orthopedic Surgery

## 2017-10-15 DIAGNOSIS — M79605 Pain in left leg: Secondary | ICD-10-CM

## 2017-10-15 DIAGNOSIS — M1712 Unilateral primary osteoarthritis, left knee: Secondary | ICD-10-CM | POA: Diagnosis not present

## 2017-10-15 MED ORDER — BUPIVACAINE HCL 0.25 % IJ SOLN
4.0000 mL | INTRAMUSCULAR | Status: AC | PRN
  Administered 2017-10-15: 4 mL via INTRA_ARTICULAR

## 2017-10-15 MED ORDER — METHYLPREDNISOLONE ACETATE 40 MG/ML IJ SUSP
40.0000 mg | INTRAMUSCULAR | Status: AC | PRN
  Administered 2017-10-15: 40 mg via INTRA_ARTICULAR

## 2017-10-15 MED ORDER — LIDOCAINE HCL 1 % IJ SOLN
5.0000 mL | INTRAMUSCULAR | Status: AC | PRN
  Administered 2017-10-15: 5 mL

## 2017-10-15 NOTE — Progress Notes (Signed)
Office Visit Note   Patient: Elaine Carter           Date of Birth: 11/08/1940           MRN: [id number] Visit Date: 10/15/2017 Requested by: Gaynelle Arabian, MD [address] [place], [place] [address] PCP: Gaynelle Arabian, MD  Subjective: Chief Complaint  Patient presents with  . Left Leg - Pain    HPI: Elaine Carter is a patient with left leg pain.  She fell in February visiting her daughter in [place].  This was 6 weeks ago.  This was essentially an impact type injury in the middle of the tibia.  She has been weightbearing since that time.  One week ago she tripped into a heavy pot at home causing her left knee to become painful.  She has a previous history of left knee aspiration and injection 2 years ago which gave her good relief.  She is doing a seated fitness class.  She has a known history of psoriatic arthritis and wonders if she should visit a rheumatologist.              ROS: All systems reviewed are negative as they relate to the chief complaint within the history of present illness.  Patient denies  fevers or chills.   Assessment & Plan: Visit Diagnoses:  1. Pain in left leg     Plan: Impression is left knee pain with mild effusion and severe patellofemoral arthritis which is highly erosive.  Right knee examines better than the left.  Plan is aspiration and injection of the left knee.  Left tib-fib region is normal with no fracture.  I think she would do well with episodic injections.  She has to decide whether or not she wants body wide immunosuppression for her psoriatic arthritis based on the amount of clinical symptoms she is having currently.  In general she is going to do poorly with stairs but flat ground walking based on the amount of arthritis she has in the medial and lateral compartments should be reasonably tolerated I will see her back as needed.  Follow-Up Instructions: Return if symptoms worsen or fail to improve.   Orders:  Orders Placed This  Encounter  Procedures  . XR KNEE 3 VIEW LEFT  . XR Tibia/Fibula Left   No orders of the defined types were placed in this encounter.     Procedures: Large Joint Inj: L knee on 10/15/2017 8:36 PM Indications: diagnostic evaluation, joint swelling and pain Details: 18 G 1.5 in needle, superolateral approach  Arthrogram: No  Medications: 5 mL lidocaine 1 %; 40 mg methylPREDNISolone acetate 40 MG/ML; 4 mL bupivacaine 0.25 % Outcome: tolerated well, no immediate complications Procedure, treatment alternatives, risks and benefits explained, specific risks discussed. Consent was given by the patient. Immediately prior to procedure a time out was called to verify the correct patient, procedure, equipment, support staff and site/side marked as required. Patient was prepped and draped in the usual sterile fashion.       Clinical Data: No additional findings.  Objective: Vital Signs: There were no vitals taken for this visit.  Physical Exam:   Constitutional: Patient appears well-developed HEENT:  Head: Normocephalic Eyes:EOM are normal Neck: Normal range of motion Cardiovascular: Normal rate Pulmonary/chest: Effort normal Neurologic: Patient is alert Skin: Skin is warm Psychiatric: Patient has normal mood and affect    Ortho Exam: Orthopedic exam demonstrates normal gait and alignment.  She has patellofemoral crepitus on the left  compared to the right.  Collateral and cruciate ligaments are stable.  Pedal pulses palpable.  Extensor mechanism is intact.  Some tenderness in that mid tibial region.  No real warmth erythema or fluctuance noted in this area.  No other masses lymphadenopathy or skin changes noted in the leg or knee region.  Specialty Comments:  No specialty comments available.  Imaging: Xr Tibia/fibula Left  Result Date: 10/15/2017 AP lateral left tib-fib reviewed.  Negative for fracture.  Mild degenerative changes noted in the knee.  Xr Knee 3 View  Left  Result Date: 10/15/2017 AP lateral merchant left knee reviewed.  Mild lateral compartment arthritis is noted.  No fracture or dislocation is present.  Severe and deforming patellofemoral arthritis is seen on the merchant view.    PMFS History: Patient Active Problem List   Diagnosis Date Noted  . Genetic testing 09/01/2016  . Monoallelic mutation of CHEK2 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   . Family history of cancer   . Lipoma of arm 02/06/2013  . Personal history of breast cancer 07/31/1996   Past Medical History:  Diagnosis Date  . Arthritis   . Constipation   . Contact lens/glasses fitting    wears contacts or glasses  . Diverticular disease   . Family history of cancer   . Genetic testing 09/01/2016   Test results: 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  Genes tested: 43 genes on [facility]'s Common Cancers panel (APC, ATM, AXIN2, BARD1, BMPR1A, BRCA1, BRCA2, BRIP1, CDH1, CDKN2A, CHEK2, DICER1, EPCAM, GREM1, HOXB13, KIT, MEN1, MLH1, MSH2, MSH6, MUTYH, NBN, NF1, PALB2, PDGFRA, PMS2, POLD1, POLE, PTEN, RAD50, RAD51C, RAD51D, SDHA, SDHB, SDHC, SDHD, SMAD4, SMARCA4,   . GERD (gastroesophageal reflux disease)   . Hypertension   . Monoallelic mutation of CHEK2 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    Pathogenic CHEK2 mutation c.591delA (p.Val198Phefs*7) @ [facility]  . Personal history of breast cancer 1998   left breast; estrogen positive    Family History  Problem Relation Age of Onset  . Prostate cancer Maternal Uncle        Dx 38s; deceased 6s  . Stomach cancer Paternal Uncle        Dx 80s; deceased 76  . Cancer Maternal Uncle        unk. type  . Cancer Maternal Uncle        throat ca (smoker); deceased 44s  . Leukemia Cousin        mat female 1st cousin; deceased 25  . Breast cancer Other        mat grandmother's sister    Past Surgical History:  Procedure Laterality Date  . ABDOMINAL HYSTERECTOMY  2012   vag hystBSO  . ANTERIOR AND POSTERIOR REPAIR   2012   bladder tape sliong-cysto along with hyst  . BREAST BIOPSY  1998   lt  . BREAST LUMPECTOMY  1998   left  . COLONOSCOPY     x3  . LIPOMA EXCISION Left 02/06/2013   Procedure: EXCISION OF LEFT UPPER ARM LIPOMA;  Surgeon: Theodoro Kos, DO;  Location: [facility];  Service: Plastics;  Laterality: Left;  . TUBAL LIGATION     Social History   Occupational History  . Not on file  Tobacco Use  . Smoking status: Former Smoker    Last attempt to quit: 02/04/1999    Years since quitting: 18.7  . Smokeless tobacco: Never Used  Substance and Sexual Activity  . Alcohol use: Yes    Comment:  2 wines daily  . Drug use: No  . Sexual activity: Not on file

## 2017-10-18 ENCOUNTER — Telehealth: Payer: Self-pay | Admitting: Hematology and Oncology

## 2017-10-18 ENCOUNTER — Inpatient Hospital Stay: Payer: Medicare Other | Attending: Hematology and Oncology | Admitting: Hematology and Oncology

## 2017-10-18 VITALS — BP 187/63 | HR 65 | Temp 98.0°F | Resp 18 | Ht 61.0 in | Wt 134.8 lb

## 2017-10-18 DIAGNOSIS — Z1501 Genetic susceptibility to malignant neoplasm of breast: Secondary | ICD-10-CM | POA: Diagnosis not present

## 2017-10-18 DIAGNOSIS — Z1502 Genetic susceptibility to malignant neoplasm of ovary: Secondary | ICD-10-CM

## 2017-10-18 DIAGNOSIS — Z1509 Genetic susceptibility to other malignant neoplasm: Secondary | ICD-10-CM | POA: Diagnosis not present

## 2017-10-18 DIAGNOSIS — Z1231 Encounter for screening mammogram for malignant neoplasm of breast: Secondary | ICD-10-CM

## 2017-10-18 DIAGNOSIS — Z1589 Genetic susceptibility to other disease: Secondary | ICD-10-CM

## 2017-10-18 NOTE — Assessment & Plan Note (Signed)
CHEK2 gene called c.591delA (p.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  Based on NCCN guidelines, this is a pathogenic mutation that has been associated with risk of not only breast cancer but also colon, thyroid and kidney cancers  Breast cancer surveillance: Breast MRI 11/01/2016: Prominent left axillary lymph node ultrasound biopsy: Benign Mammogram 05/17/2017: No evidence of malignancy within either breast  Other cancer surveillance: Apart of breast cancer, there are no definite surveillance approaches to kidney cancer. For colon cancer she will need a colonoscopy  every 5 years. For thyroid cancer, she will need manual thyroid examinations for any lumps or nodules. Return to clinic in 1 year for follow-up in survivorship clinic

## 2017-10-18 NOTE — Progress Notes (Signed)
Patient Care Team: Gaynelle Arabian, MD as PCP - General (Family Medicine)  DIAGNOSIS:  Encounter Diagnosis  Name Primary?  . Monoallelic mutation of CHEK2 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 Yes    CHIEF COMPLIANT: Surveillance of high risk breast cancer Chek 2 mutation  INTERVAL HISTORY: Elaine Carter is a 77 year old with above-mentioned history of Chek 2 mutation who is here for annual surveillance checkup.  She had a MRI of the breast last year which showed an abnormal lymph node which on biopsy was benign.  She also had a mammogram in October which was normal.  She is here today for a breast exam and follow-up.  She tells me that they have diagnosed with autoimmune diseases.  She was wondering if it is anything to do with her check 2.  REVIEW OF SYSTEMS:   Constitutional: Denies fevers, chills or abnormal weight loss Eyes: Denies blurriness of vision Ears, nose, mouth, throat, and face: Denies mucositis or sore throat Respiratory: Denies cough, dyspnea or wheezes Cardiovascular: Denies palpitation, chest discomfort Gastrointestinal:  Denies nausea, heartburn or change in bowel habits Skin: Denies abnormal skin rashes Lymphatics: Denies new lymphadenopathy or easy bruising Neurological:Denies numbness, tingling or new weaknesses Behavioral/Psych: Mood is stable, no new changes  Extremities: No lower extremity edema Breast:  denies any pain or lumps or nodules in either breasts All other systems were reviewed with the patient and are negative.  I have reviewed the past medical history, past surgical history, social history and family history with the patient and they are unchanged from previous note.  ALLERGIES:  is allergic to codeine and tape.  MEDICATIONS:  Current Outpatient Medications  Medication Sig Dispense Refill  . amLODipine (NORVASC) 5 MG tablet Take 5 mg by mouth daily.    Marland Kitchen aspirin EC 81 MG tablet Take 81 mg by mouth daily.    . calcium citrate-vitamin D (CITRACAL+D)  315-200 MG-UNIT per tablet Take 1 tablet by mouth 2 (two) times daily.    . chlorpheniramine (CHLOR-TRIMETON) 4 MG tablet Take 4 mg by mouth 2 (two) times daily as needed for allergies.    . cholecalciferol (VITAMIN D) 1000 UNITS tablet Take 1,000 Units by mouth daily. 2000u    . indapamide (LOZOL) 2.5 MG tablet Take 2.5 mg by mouth every morning.    Marland Kitchen losartan (COZAAR) 100 MG tablet Take 100 mg by mouth daily.    . meloxicam (MOBIC) 15 MG tablet Take 15 mg by mouth daily.    Marland Kitchen omeprazole (PRILOSEC) 20 MG capsule Take 20 mg by mouth daily.    . polycarbophil (FIBERCON) 625 MG tablet Take 625 mg by mouth as needed.     No current facility-administered medications for this visit.     PHYSICAL EXAMINATION: ECOG PERFORMANCE STATUS: 1 - Symptomatic but completely ambulatory  Vitals:   10/18/17 1018  BP: (!) 187/63  Pulse: 65  Resp: 18  Temp: 98 F (36.7 C)  SpO2: 99%   Filed Weights   10/18/17 1018  Weight: 134 lb 12.8 oz (61.1 kg)    GENERAL:alert, no distress and comfortable SKIN: skin color, texture, turgor are normal, no rashes or significant lesions EYES: normal, Conjunctiva are pink and non-injected, sclera clear OROPHARYNX:no exudate, no erythema and lips, buccal mucosa, and tongue normal  NECK: supple, thyroid normal size, non-tender, without nodularity LYMPH:  no palpable lymphadenopathy in the cervical, axillary or inguinal LUNGS: clear to auscultation and percussion with normal breathing effort HEART: regular rate & rhythm and no murmurs and  no lower extremity edema ABDOMEN:abdomen soft, non-tender and normal bowel sounds MUSCULOSKELETAL:no cyanosis of digits and no clubbing  NEURO: alert & oriented x 3 with fluent speech, no focal motor/sensory deficits EXTREMITIES: No lower extremity edema BREAST: No palpable masses or nodules in either right or left breasts. No palpable axillary supraclavicular or infraclavicular adenopathy no breast tenderness or nipple discharge.  (exam performed in the presence of a chaperone)  LABORATORY DATA:  I have reviewed the data as listed CMP Latest Ref Rng & Units 11/01/2016 02/04/2013 10/12/2010  Glucose 70 - 99 mg/dL - 119(H) 129(H)  BUN 6 - 23 mg/dL - 22 6  Creatinine 0.44 - 1.00 mg/dL 0.70 0.69 0.47  Sodium 135 - 145 mEq/L - 134(L) 135 REPEATED TO VERIFY  Potassium 3.5 - 5.1 mEq/L - 3.5 3.6 DELTA CHECK NOTED REPEATED TO VERIFY  Chloride 96 - 112 mEq/L - 96 101  CO2 19 - 32 mEq/L - 27 28  Calcium 8.4 - 10.5 mg/dL - 9.4 8.2(L)  Total Protein 6.0 - 8.3 g/dL - - -  Total Bilirubin 0.3 - 1.2 mg/dL - - -  Alkaline Phos 39 - 117 U/L - - -  AST 0 - 37 U/L - - -  ALT 0 - 35 U/L - - -    Lab Results  Component Value Date   WBC 13.1 (H) 10/12/2010   HGB 12.6 02/06/2013   HCT 30.0 (L) 10/12/2010   MCV 92.9 10/12/2010   PLT 235 10/12/2010    ASSESSMENT & PLAN:  Monoallelic mutation of CHEK2 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 CHEK2 gene called c.591delA (p.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  Based on NCCN guidelines, this is a pathogenic mutation that has been associated with risk of not only breast cancer but also colon, thyroid and kidney cancers  Breast cancer surveillance: Breast MRI 11/01/2016: Prominent left axillary lymph node ultrasound biopsy: Benign Mammogram 05/17/2017: No evidence of malignancy within either breast  Breast MRI has been ordered for April 2019. If this is normal then we can plan on doing breast MRIs every other year.  Other cancer surveillance: Apart of breast cancer, there are no definite surveillance approaches to kidney cancer. For colon cancer she will need a colonoscopy  every 5 years. For thyroid cancer, she will need manual thyroid examinations for any lumps or nodules. Return to clinic in 1 year for follow-up.  I spent 25 minutes talking to the patient of which more than half was spent in counseling and coordination of care.  No orders of the defined types were placed in this encounter.  The patient has a good  understanding of the overall plan. she agrees with it. she will call with any problems that may develop before the next visit here.   Harriette Ohara, MD 10/18/17

## 2017-10-18 NOTE — Telephone Encounter (Signed)
Gave patient AVs and calendar of upcoming march 2020 appointments.  °

## 2017-10-23 ENCOUNTER — Other Ambulatory Visit: Payer: Self-pay | Admitting: *Deleted

## 2017-10-23 ENCOUNTER — Telehealth: Payer: Self-pay | Admitting: Hematology and Oncology

## 2017-10-23 DIAGNOSIS — Z1502 Genetic susceptibility to malignant neoplasm of ovary: Principal | ICD-10-CM

## 2017-10-23 DIAGNOSIS — Z1589 Genetic susceptibility to other disease: Principal | ICD-10-CM

## 2017-10-23 DIAGNOSIS — Z1509 Genetic susceptibility to other malignant neoplasm: Principal | ICD-10-CM

## 2017-10-23 DIAGNOSIS — Z1501 Genetic susceptibility to malignant neoplasm of breast: Secondary | ICD-10-CM

## 2017-10-23 NOTE — Telephone Encounter (Signed)
Spoke to patient regarding upcoming April appointments per 3/26 sch message.

## 2017-11-07 ENCOUNTER — Ambulatory Visit (HOSPITAL_COMMUNITY)
Admission: RE | Admit: 2017-11-07 | Discharge: 2017-11-07 | Disposition: A | Discharge status: Home / Self Care | Payer: Medicare Other | Source: Ambulatory Visit | Attending: Hematology and Oncology | Admitting: Hematology and Oncology

## 2017-11-07 ENCOUNTER — Inpatient Hospital Stay: Payer: Medicare Other | Attending: Hematology and Oncology

## 2017-11-07 DIAGNOSIS — Z1501 Genetic susceptibility to malignant neoplasm of breast: Secondary | ICD-10-CM | POA: Insufficient documentation

## 2017-11-07 DIAGNOSIS — Z1502 Genetic susceptibility to malignant neoplasm of ovary: Secondary | ICD-10-CM

## 2017-11-07 DIAGNOSIS — Z853 Personal history of malignant neoplasm of breast: Secondary | ICD-10-CM | POA: Insufficient documentation

## 2017-11-07 DIAGNOSIS — Z1589 Genetic susceptibility to other disease: Secondary | ICD-10-CM

## 2017-11-07 DIAGNOSIS — Z1509 Genetic susceptibility to other malignant neoplasm: Secondary | ICD-10-CM | POA: Diagnosis present

## 2017-11-07 DIAGNOSIS — Z1231 Encounter for screening mammogram for malignant neoplasm of breast: Secondary | ICD-10-CM

## 2017-11-07 LAB — CBC WITH DIFFERENTIAL (CANCER CENTER ONLY)
BASOS ABS: 0.1 10*3/uL (ref 0.0–0.1)
Basophils Relative: 1 %
Eosinophils Absolute: 0.2 10*3/uL (ref 0.0–0.5)
Eosinophils Relative: 3 %
HCT: 34.4 % — ABNORMAL LOW (ref 34.8–46.6)
HEMOGLOBIN: 12.1 g/dL (ref 11.6–15.9)
Lymphocytes Relative: 24 %
Lymphs Abs: 1.9 10*3/uL (ref 0.9–3.3)
MCH: 34.2 pg — ABNORMAL HIGH (ref 25.1–34.0)
MCHC: 35.1 g/dL (ref 31.5–36.0)
MCV: 97.5 fL (ref 79.5–101.0)
Monocytes Absolute: 0.6 10*3/uL (ref 0.1–0.9)
Monocytes Relative: 8 %
NEUTROS PCT: 64 %
Neutro Abs: 5.2 10*3/uL (ref 1.5–6.5)
PLATELETS: 282 10*3/uL (ref 145–400)
RBC: 3.53 MIL/uL — AB (ref 3.70–5.45)
RDW: 12.5 % (ref 11.2–14.5)
WBC: 8 10*3/uL (ref 3.9–10.3)

## 2017-11-07 LAB — CMP (CANCER CENTER ONLY)
ALK PHOS: 128 U/L (ref 40–150)
ALT: 15 U/L (ref 0–55)
AST: 15 U/L (ref 5–34)
Albumin: 3.9 g/dL (ref 3.5–5.0)
Anion gap: 8 (ref 3–11)
BUN: 15 mg/dL (ref 7–26)
CALCIUM: 9.5 mg/dL (ref 8.4–10.4)
CO2: 25 mmol/L (ref 22–29)
CREATININE: 0.78 mg/dL (ref 0.60–1.10)
Chloride: 103 mmol/L (ref 98–109)
Glucose, Bld: 122 mg/dL (ref 70–140)
Potassium: 3.2 mmol/L — ABNORMAL LOW (ref 3.5–5.1)
SODIUM: 136 mmol/L (ref 136–145)
Total Bilirubin: 0.7 mg/dL (ref 0.2–1.2)
Total Protein: 6.7 g/dL (ref 6.4–8.3)

## 2017-11-07 MED ORDER — GADOBENATE DIMEGLUMINE 529 MG/ML IV SOLN
15.0000 mL | Freq: Once | INTRAVENOUS | Status: AC | PRN
  Administered 2017-11-07: 12 mL via INTRAVENOUS

## 2017-12-07 ENCOUNTER — Ambulatory Visit (HOSPITAL_COMMUNITY): Payer: Medicare Other

## 2018-02-07 ENCOUNTER — Telehealth (INDEPENDENT_AMBULATORY_CARE_PROVIDER_SITE_OTHER): Payer: Self-pay

## 2018-02-07 NOTE — Telephone Encounter (Signed)
Patient would like to know what can be done for the pain until she can see Dr. Marlou Sa on Monday, July 22.  Cb# is [phone].  Please advise.  Thank you.

## 2018-02-07 NOTE — Telephone Encounter (Signed)
Patient would like to know what can be done for the pain until she can see Dr. Marlou Sa on Monday, July 22.  Cb# is [phone].  Please advise.  Thank you

## 2018-02-08 NOTE — Telephone Encounter (Signed)
IC s/w patient and advised to try heat and either tylenol or anti-inflamm.

## 2018-02-18 ENCOUNTER — Ambulatory Visit (INDEPENDENT_AMBULATORY_CARE_PROVIDER_SITE_OTHER): Payer: Medicare Other

## 2018-02-18 ENCOUNTER — Ambulatory Visit (INDEPENDENT_AMBULATORY_CARE_PROVIDER_SITE_OTHER): Payer: Medicare Other | Admitting: Orthopedic Surgery

## 2018-02-18 ENCOUNTER — Encounter (INDEPENDENT_AMBULATORY_CARE_PROVIDER_SITE_OTHER): Payer: Self-pay | Admitting: Orthopedic Surgery

## 2018-02-18 DIAGNOSIS — M79604 Pain in right leg: Secondary | ICD-10-CM

## 2018-02-18 DIAGNOSIS — M545 Low back pain: Secondary | ICD-10-CM

## 2018-02-19 NOTE — Progress Notes (Signed)
 Office Visit Note   Patient: Elaine Carter           Date of Birth: 12/04/1940           MRN: [id number] Visit Date: 02/18/2018 Requested by: Ehinger, Robert, MD [address] [place], [place] [address] PCP: Ehinger, Robert, MD  Subjective: Chief Complaint  Patient presents with  . Right Hip - Pain    HPI: Elaine Carter is a patient with right hip pain.  She reports buttock and back pain also.  Denies any specific injury.  Started after she was driving for 8 hours in a car.  This was a new car.  She woke up on 02/07/2018 and it was hard for her to straighten out her leg.  Had right posterior buttock leg pain.  Denies any numbness and tingling.  She has improved using Tylenol and salon poss cream.  She does have a history of having vertebral body fractures in the past.  Brace helped her at that time.  She does do exercise.  She has been recommended to take Tylenol only for symptoms due to blood pressure medication.              ROS: All systems reviewed are negative as they relate to the chief complaint within the history of present illness.  Patient denies  fevers or chills.   Assessment & Plan: Visit Diagnoses:  1. Pain in right leg     Plan: Impression is right buttock and back pain which is likely coming from the back.  Hip exam and x-rays are pretty benign.  Symptoms are improving currently.  I think she should try taking 2 Aleve twice a day for 5 days and then if that does not help then we should work her back up a little bit more potentially with MRI scan and injections to follow.  Currently she is moderately asymptomatic.  Certainly better than she was a few days ago.  We will see her back if this 1 week burst of anti-inflammatories does not help  Follow-Up Instructions: Return if symptoms worsen or fail to improve.   Orders:  Orders Placed This Encounter  Procedures  . XR HIP UNILAT W OR W/O PELVIS 2-3 VIEWS RIGHT  . XR Lumbar Spine 2-3 Views   No orders of the  defined types were placed in this encounter.     Procedures: No procedures performed   Clinical Data: No additional findings.  Objective: Vital Signs: There were no vitals taken for this visit.  Physical Exam:   Constitutional: Patient appears well-developed HEENT:  Head: Normocephalic Eyes:EOM are normal Neck: Normal range of motion Cardiovascular: Normal rate Pulmonary/chest: Effort normal Neurologic: Patient is alert Skin: Skin is warm Psychiatric: Patient has normal mood and affect    Ortho Exam: Orthopedic exam demonstrates full active and passive range of motion of the right hip.  She has no groin pain with internal/external rotation of the leg.  No nerve root tension signs.  No other masses lymphadenopathy or skin changes noted in that right hip region.  She has good ankle dorsiflexion plantarflexion quad hamstring strength and symmetric reflexes and pulses in both legs.  Specialty Comments:  No specialty comments available.  Imaging: No results found.   PMFS History: Patient Active Problem List   Diagnosis Date Noted  . Genetic testing 09/01/2016  . Monoallelic mutation of CHEK2 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   . Family history of cancer   . Lipoma of arm 02/06/2013  .   Personal history of breast cancer 07/31/1996   Past Medical History:  Diagnosis Date  . Arthritis   . Constipation   . Contact lens/glasses fitting    wears contacts or glasses  . Diverticular disease   . Family history of cancer   . Genetic testing 09/01/2016   Test results: 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  Genes tested: 43 genes on [facility]'s Common Cancers panel (APC, ATM, AXIN2, BARD1, BMPR1A, BRCA1, BRCA2, BRIP1, CDH1, CDKN2A, CHEK2, DICER1, EPCAM, GREM1, HOXB13, KIT, MEN1, MLH1, MSH2, MSH6, MUTYH, NBN, NF1, PALB2, PDGFRA, PMS2, POLD1, POLE, PTEN, RAD50, RAD51C, RAD51D, SDHA, SDHB, SDHC, SDHD, SMAD4, SMARCA4,   . GERD (gastroesophageal reflux disease)    . Hypertension   . Monoallelic mutation of CHEK2 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    Pathogenic CHEK2 mutation c.591delA (p.Val198Phefs*7) @ [facility]  . Personal history of breast cancer 1998   left breast; estrogen positive    Family History  Problem Relation Age of Onset  . Prostate cancer Maternal Uncle        Dx 60s; deceased 60s  . Stomach cancer Paternal Uncle        Dx 60s; deceased 70  . Cancer Maternal Uncle        unk. type  . Cancer Maternal Uncle        throat ca (smoker); deceased 50s  . Leukemia Cousin        mat female 1st cousin; deceased 22  . Breast cancer Other        mat grandmother's sister    Past Surgical History:  Procedure Laterality Date  . ABDOMINAL HYSTERECTOMY  2012   vag hystBSO  . ANTERIOR AND POSTERIOR REPAIR  2012   bladder tape sliong-cysto along with hyst  . BREAST BIOPSY  1998   lt  . BREAST LUMPECTOMY  1998   left  . COLONOSCOPY     x3  . LIPOMA EXCISION Left 02/06/2013   Procedure: EXCISION OF LEFT UPPER ARM LIPOMA;  Surgeon: Claire Sanger, DO;  Location: [facility];  Service: Plastics;  Laterality: Left;  . TUBAL LIGATION     Social History   Occupational History  . Not on file  Tobacco Use  . Smoking status: Former Smoker    Last attempt to quit: 02/04/1999    Years since quitting: 19.0  . Smokeless tobacco: Never Used  Substance and Sexual Activity  . Alcohol use: Yes    Comment: 2 wines daily  . Drug use: No  . Sexual activity: Not on file       

## 2018-04-15 ENCOUNTER — Other Ambulatory Visit: Payer: Self-pay | Admitting: Hematology and Oncology

## 2018-04-15 DIAGNOSIS — Z1231 Encounter for screening mammogram for malignant neoplasm of breast: Secondary | ICD-10-CM

## 2018-05-22 ENCOUNTER — Ambulatory Visit
Admission: RE | Admit: 2018-05-22 | Discharge: 2018-05-22 | Disposition: A | Discharge status: Home / Self Care | Payer: Medicare Other | Source: Ambulatory Visit | Attending: Hematology and Oncology | Admitting: Hematology and Oncology

## 2018-05-22 DIAGNOSIS — Z1231 Encounter for screening mammogram for malignant neoplasm of breast: Secondary | ICD-10-CM

## 2018-10-24 ENCOUNTER — Ambulatory Visit: Payer: Medicare Other | Admitting: Hematology and Oncology

## 2019-02-06 NOTE — Assessment & Plan Note (Signed)
CHEK2 gene called c.591delA (p.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  Based on NCCN guidelines, this is a pathogenic mutation that has been associated with risk of not only breast cancer but also colon, thyroid and kidney cancers  Breast cancer surveillance: Breast MRI 11/01/2016: Prominent left axillary lymph node ultrasound biopsy: Benign Mammogram 05/22/2018: No evidence of malignancy within either breast: Breast density category C  Breast MRI April 2019: No evidence of malignancy  Other cancer surveillance: Apart of breast cancer, there are no definite surveillance approaches to kidney cancer. For colon cancer she will need a colonoscopyevery 5 years. For thyroid cancer,she will need manual thyroid examinations for any lumps or nodules.  Return to clinic in 1 year for follow-up.

## 2019-02-10 ENCOUNTER — Telehealth: Payer: Self-pay | Admitting: Hematology and Oncology

## 2019-02-10 NOTE — Telephone Encounter (Signed)
I talk with patient and she was having phone trouble

## 2019-02-12 NOTE — Progress Notes (Signed)
Patient Care Team: Gaynelle Arabian, MD as PCP - General (Family Medicine)  DIAGNOSIS:    ICD-10-CM   1. Monoallelic mutation of CHEK2 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  Z15.01    Z15.89    Z15.09    Z15.02     CHIEF COMPLIANT: Surveillance of high risk breast cancer Chek 2 mutation  INTERVAL HISTORY: Elaine Carter is a 78 y.o. with above-mentioned history of Chek 2 mutation who is currently on surveillance. I last saw her over a year ago. Breast MRI on 11/07/17 showed no evidence of malignancy. Mammogram on 05/22/18 showed no evidence of malignancy. She presents to the clinic today for annual surveillance.   REVIEW OF SYSTEMS:   Constitutional: Denies fevers, chills or abnormal weight loss Eyes: Denies blurriness of vision Ears, nose, mouth, throat, and face: Denies mucositis or sore throat Respiratory: Denies cough, dyspnea or wheezes Cardiovascular: Denies palpitation, chest discomfort Gastrointestinal: Denies nausea, heartburn or change in bowel habits Skin: Denies abnormal skin rashes Lymphatics: Denies new lymphadenopathy or easy bruising Neurological: Denies numbness, tingling or new weaknesses Behavioral/Psych: Mood is stable, no new changes  Extremities: No lower extremity edema Breast: denies any pain or lumps or nodules in either breasts All other systems were reviewed with the patient and are negative.  I have reviewed the past medical history, past surgical history, social history and family history with the patient and they are unchanged from previous note.  ALLERGIES:  is allergic to codeine and tape.  MEDICATIONS:  Current Outpatient Medications  Medication Sig Dispense Refill   amLODipine (NORVASC) 5 MG tablet Take 5 mg by mouth daily.     aspirin EC 81 MG tablet Take 81 mg by mouth daily.     calcium citrate-vitamin D (CITRACAL+D) 315-200 MG-UNIT per tablet Take 1 tablet by mouth 2 (two) times daily.     chlorpheniramine (CHLOR-TRIMETON) 4 MG tablet Take 4 mg by  mouth 2 (two) times daily as needed for allergies.     cholecalciferol (VITAMIN D) 1000 UNITS tablet Take 1,000 Units by mouth daily. 2000u     losartan (COZAAR) 100 MG tablet Take 100 mg by mouth daily.     meloxicam (MOBIC) 15 MG tablet Take 15 mg by mouth daily.     omeprazole (PRILOSEC) 20 MG capsule Take 20 mg by mouth daily.     polycarbophil (FIBERCON) 625 MG tablet Take 625 mg by mouth as needed.     No current facility-administered medications for this visit.     PHYSICAL EXAMINATION: ECOG PERFORMANCE STATUS: 1 - Symptomatic but completely ambulatory  Vitals:   02/13/19 1016  BP: (!) 169/53  Pulse: 72  Resp: 16  Temp: 98.3 F (36.8 C)  SpO2: 99%   Filed Weights   02/13/19 1016  Weight: 128 lb 3.2 oz (58.2 kg)    GENERAL: alert, no distress and comfortable SKIN: skin color, texture, turgor are normal, no rashes or significant lesions EYES: normal, Conjunctiva are pink and non-injected, sclera clear OROPHARYNX: no exudate, no erythema and lips, buccal mucosa, and tongue normal  NECK: supple, thyroid normal size, non-tender, without nodularity LYMPH: no palpable lymphadenopathy in the cervical, axillary or inguinal LUNGS: clear to auscultation and percussion with normal breathing effort HEART: regular rate & rhythm and no murmurs and no lower extremity edema ABDOMEN: abdomen soft, non-tender and normal bowel sounds MUSCULOSKELETAL: no cyanosis of digits and no clubbing  NEURO: alert & oriented x 3 with fluent speech, no focal motor/sensory deficits EXTREMITIES: No lower  extremity edema BREAST: No palpable masses or nodules in either right or left breasts. No palpable axillary supraclavicular or infraclavicular adenopathy no breast tenderness or nipple discharge. (exam performed in the presence of a chaperone)  LABORATORY DATA:  I have reviewed the data as listed CMP Latest Ref Rng & Units 11/07/2017 11/01/2016 02/04/2013  Glucose 70 - 140 mg/dL 122 - 119(H)  BUN 7 -  26 mg/dL 15 - 22  Creatinine 0.60 - 1.10 mg/dL 0.78 0.70 0.69  Sodium 136 - 145 mmol/L 136 - 134(L)  Potassium 3.5 - 5.1 mmol/L 3.2(L) - 3.5  Chloride 98 - 109 mmol/L 103 - 96  CO2 22 - 29 mmol/L 25 - 27  Calcium 8.4 - 10.4 mg/dL 9.5 - 9.4  Total Protein 6.4 - 8.3 g/dL 6.7 - -  Total Bilirubin 0.2 - 1.2 mg/dL 0.7 - -  Alkaline Phos 40 - 150 U/L 128 - -  AST 5 - 34 U/L 15 - -  ALT 0 - 55 U/L 15 - -    Lab Results  Component Value Date   WBC 8.0 11/07/2017   HGB 12.1 11/07/2017   HCT 34.4 (L) 11/07/2017   MCV 97.5 11/07/2017   PLT 282 11/07/2017   NEUTROABS 5.2 11/07/2017    ASSESSMENT & PLAN:  Monoallelic mutation of CHEK2 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 CHEK2 gene called c.591delA (p.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  Based on NCCN guidelines, this is a pathogenic mutation that has been associated with risk of not only breast cancer but also colon, thyroid and kidney cancers  Breast cancer surveillance: Breast MRI 11/07/2017: No evidence of malignancy Mammogram 05/22/2018: No evidence of malignancy within either breast: Breast density category C  We will plan to do breast MRIs every other year.  Next MRI will be done in 2021.  She will get her mammogram done in October.  Other cancer surveillance: Apart of breast cancer, there are no definite surveillance approaches to kidney cancer. For colon cancer she will need a colonoscopyevery 5 years. For thyroid cancer,she will need manual thyroid examinations for any lumps or nodules.  Return to clinic in 1 year for follow-up.    No orders of the defined types were placed in this encounter.  The patient has a good understanding of the overall plan. she agrees with it. she will call with any problems that may develop before the next visit here.  Nicholas Lose, MD 02/13/2019  Julious Oka Dorshimer am acting as scribe for Dr. Nicholas Lose.  I have reviewed the above documentation for accuracy and completeness, and I agree with the above.

## 2019-02-13 ENCOUNTER — Other Ambulatory Visit: Payer: Self-pay

## 2019-02-13 ENCOUNTER — Inpatient Hospital Stay: Payer: Medicare Other | Attending: Hematology and Oncology | Admitting: Hematology and Oncology

## 2019-02-13 DIAGNOSIS — Z1501 Genetic susceptibility to malignant neoplasm of breast: Secondary | ICD-10-CM | POA: Insufficient documentation

## 2019-02-13 DIAGNOSIS — Z1589 Genetic susceptibility to other disease: Secondary | ICD-10-CM | POA: Diagnosis not present

## 2019-02-13 DIAGNOSIS — Z1231 Encounter for screening mammogram for malignant neoplasm of breast: Secondary | ICD-10-CM

## 2019-02-13 DIAGNOSIS — Z1509 Genetic susceptibility to other malignant neoplasm: Secondary | ICD-10-CM

## 2019-02-13 DIAGNOSIS — Z1502 Genetic susceptibility to malignant neoplasm of ovary: Secondary | ICD-10-CM

## 2019-02-14 ENCOUNTER — Telehealth: Payer: Self-pay | Admitting: Hematology and Oncology

## 2019-02-14 NOTE — Telephone Encounter (Signed)
I talk with patient regarding schedule  

## 2019-04-11 ENCOUNTER — Other Ambulatory Visit: Payer: Self-pay | Admitting: Hematology and Oncology

## 2019-04-11 DIAGNOSIS — Z1231 Encounter for screening mammogram for malignant neoplasm of breast: Secondary | ICD-10-CM

## 2019-05-26 ENCOUNTER — Other Ambulatory Visit: Payer: Self-pay

## 2019-05-26 ENCOUNTER — Ambulatory Visit
Admission: RE | Admit: 2019-05-26 | Discharge: 2019-05-26 | Disposition: A | Discharge status: Home / Self Care | Payer: Medicare Other | Source: Ambulatory Visit | Attending: Hematology and Oncology | Admitting: Hematology and Oncology

## 2019-05-26 DIAGNOSIS — Z1231 Encounter for screening mammogram for malignant neoplasm of breast: Secondary | ICD-10-CM

## 2019-08-24 ENCOUNTER — Ambulatory Visit: Payer: Medicare PPO | Attending: Internal Medicine

## 2019-08-24 DIAGNOSIS — Z23 Encounter for immunization: Secondary | ICD-10-CM | POA: Insufficient documentation

## 2019-08-24 NOTE — Progress Notes (Signed)
   Covid-19 Vaccination Clinic  Name:  Elaine Carter    MRN: [id number] DOB: [birth year]  08/24/2019  Elaine Carter was observed post Covid-19 immunization for 15 minutes without incidence. She was provided with Vaccine Information Sheet and instruction to access the V-Safe system.   Elaine Carter was instructed to call 911 with any severe reactions post vaccine: Marland Kitchen Difficulty breathing  . Swelling of your face and throat  . A fast heartbeat  . A bad rash all over your body  . Dizziness and weakness    Immunizations Administered    Name Date Dose VIS Date Route   Pfizer COVID-19 Vaccine 08/24/2019  2:34 PM 0.3 mL 07/11/2019 Intramuscular   Manufacturer: [place]   Lot: [id number]   [place]: [id number]

## 2019-09-11 ENCOUNTER — Ambulatory Visit: Payer: Medicare PPO | Attending: Internal Medicine

## 2019-09-11 DIAGNOSIS — Z23 Encounter for immunization: Secondary | ICD-10-CM | POA: Insufficient documentation

## 2019-09-11 NOTE — Progress Notes (Signed)
   Covid-19 Vaccination Clinic  Name:  Elaine Carter    MRN: [id number] DOB: 1940/10/22  09/11/2019  Elaine Carter was observed post Covid-19 immunization for 15 minutes without incidence. She was provided with Vaccine Information Sheet and instruction to access the V-Safe system.   Elaine Carter was instructed to call 911 with any severe reactions post vaccine: Marland Kitchen Difficulty breathing  . Swelling of your face and throat  . A fast heartbeat  . A bad rash all over your body  . Dizziness and weakness    Immunizations Administered    Name Date Dose VIS Date Route   Pfizer COVID-19 Vaccine 09/11/2019 10:03 AM 0.3 mL 07/11/2019 Intramuscular   Manufacturer: [place]   Lot: [id number]   [place]: [id number]

## 2019-11-05 DIAGNOSIS — L820 Inflamed seborrheic keratosis: Secondary | ICD-10-CM | POA: Diagnosis not present

## 2019-12-03 DIAGNOSIS — L820 Inflamed seborrheic keratosis: Secondary | ICD-10-CM | POA: Diagnosis not present

## 2020-01-08 ENCOUNTER — Ambulatory Visit
Admission: RE | Admit: 2020-01-08 | Discharge: 2020-01-08 | Disposition: A | Discharge status: Home / Self Care | Payer: Medicare PPO | Source: Ambulatory Visit | Attending: Hematology and Oncology | Admitting: Hematology and Oncology

## 2020-01-08 ENCOUNTER — Other Ambulatory Visit: Payer: Self-pay

## 2020-01-08 DIAGNOSIS — Z1231 Encounter for screening mammogram for malignant neoplasm of breast: Secondary | ICD-10-CM

## 2020-01-08 DIAGNOSIS — Z853 Personal history of malignant neoplasm of breast: Secondary | ICD-10-CM | POA: Diagnosis not present

## 2020-01-08 MED ORDER — GADOBUTROL 1 MMOL/ML IV SOLN
6.0000 mL | Freq: Once | INTRAVENOUS | Status: AC | PRN
  Administered 2020-01-08: 6 mL via INTRAVENOUS

## 2020-01-19 DIAGNOSIS — Z08 Encounter for follow-up examination after completed treatment for malignant neoplasm: Secondary | ICD-10-CM | POA: Diagnosis not present

## 2020-01-19 DIAGNOSIS — Z85828 Personal history of other malignant neoplasm of skin: Secondary | ICD-10-CM | POA: Diagnosis not present

## 2020-01-19 DIAGNOSIS — L820 Inflamed seborrheic keratosis: Secondary | ICD-10-CM | POA: Diagnosis not present

## 2020-01-27 DIAGNOSIS — K219 Gastro-esophageal reflux disease without esophagitis: Secondary | ICD-10-CM | POA: Diagnosis not present

## 2020-01-27 DIAGNOSIS — Z1389 Encounter for screening for other disorder: Secondary | ICD-10-CM | POA: Diagnosis not present

## 2020-01-27 DIAGNOSIS — I10 Essential (primary) hypertension: Secondary | ICD-10-CM | POA: Diagnosis not present

## 2020-01-27 DIAGNOSIS — E7800 Pure hypercholesterolemia, unspecified: Secondary | ICD-10-CM | POA: Diagnosis not present

## 2020-01-27 DIAGNOSIS — E559 Vitamin D deficiency, unspecified: Secondary | ICD-10-CM | POA: Diagnosis not present

## 2020-01-27 DIAGNOSIS — M1990 Unspecified osteoarthritis, unspecified site: Secondary | ICD-10-CM | POA: Diagnosis not present

## 2020-01-27 DIAGNOSIS — D692 Other nonthrombocytopenic purpura: Secondary | ICD-10-CM | POA: Diagnosis not present

## 2020-01-27 DIAGNOSIS — Z0000 Encounter for general adult medical examination without abnormal findings: Secondary | ICD-10-CM | POA: Diagnosis not present

## 2020-02-12 ENCOUNTER — Telehealth: Payer: Self-pay | Admitting: Hematology and Oncology

## 2020-02-12 NOTE — Telephone Encounter (Signed)
Per provider rescheduled 7/16 appt, called and left a msg for pt 

## 2020-02-13 ENCOUNTER — Inpatient Hospital Stay: Payer: Medicare PPO | Admitting: Hematology and Oncology

## 2020-02-13 ENCOUNTER — Ambulatory Visit: Payer: Medicare PPO | Admitting: Hematology and Oncology

## 2020-02-13 NOTE — Assessment & Plan Note (Deleted)
Based on NCCN guidelines, this is a pathogenic mutation that has been associated with risk of not only breast cancer but also colon, thyroid and kidney cancers  Breast cancer surveillance: Breast MRI 01/08/2020: No evidence of malignancy Mammogram 05/26/2019: No evidence of malignancy within either breast: Breast density category C  We will plan to do breast MRIs every other year.  Next MRI will be done in 2023.  She will get her mammogram done in October.  Other cancer surveillance: Apart of breast cancer, there are no definite surveillance approaches to kidney cancer. For colon cancer she will need a colonoscopyevery 5 years. For thyroid cancer,she will need manual thyroid examinations for any lumps or nodules.  Return to clinic in 1 year for follow-up.

## 2020-02-15 NOTE — Progress Notes (Signed)
Patient Care Team: Gaynelle Arabian, MD as PCP - General (Family Medicine)  DIAGNOSIS:    ICD-10-CM   1. Monoallelic mutation of CHEK2 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  Z15.01    Z15.89    Z15.09    Z15.02     CHIEF COMPLIANT: Surveillance of high risk breast cancer Chek 2 mutation  INTERVAL HISTORY: Elaine Carter is a 79 y.o. with above-mentioned history of Chek 2 mutation who is currently on surveillance. Breast MRI on 01/08/20 showed no evidence of malignancy bilaterally. Mammogram on 05/26/19 showed no evidence of malignancy bilaterally. She presents to the clinic today for annual surveillance.   She denies any lumps or nodules in the breast.  ALLERGIES:  is allergic to codeine, sulfa antibiotics, and tape.  MEDICATIONS:  Current Outpatient Medications  Medication Sig Dispense Refill  . amLODipine (NORVASC) 5 MG tablet Take 5 mg by mouth daily.    Marland Kitchen aspirin EC 81 MG tablet Take 81 mg by mouth daily.    . calcium citrate-vitamin D (CITRACAL+D) 315-200 MG-UNIT per tablet Take 1 tablet by mouth 2 (two) times daily.    . chlorpheniramine (CHLOR-TRIMETON) 4 MG tablet Take 4 mg by mouth 2 (two) times daily as needed for allergies.    . cholecalciferol (VITAMIN D) 1000 UNITS tablet Take 1,000 Units by mouth daily. 2000u    . losartan (COZAAR) 100 MG tablet Take 100 mg by mouth daily.    . meloxicam (MOBIC) 15 MG tablet Take 15 mg by mouth daily.    Marland Kitchen omeprazole (PRILOSEC) 20 MG capsule Take 20 mg by mouth daily.    . polycarbophil (FIBERCON) 625 MG tablet Take 625 mg by mouth as needed.     No current facility-administered medications for this visit.    PHYSICAL EXAMINATION: ECOG PERFORMANCE STATUS: 1 - Symptomatic but completely ambulatory  Vitals:   02/16/20 1138  BP: (!) 154/47  Pulse: 66  Resp: 20  Temp: 98.5 F (36.9 C)  SpO2: 100%   Filed Weights   02/16/20 1138  Weight: 123 lb 12.8 oz (56.2 kg)    BREAST: No palpable masses or nodules in either right or left breasts. No  palpable axillary supraclavicular or infraclavicular adenopathy no breast tenderness or nipple discharge. (exam performed in the presence of a chaperone)  LABORATORY DATA:  I have reviewed the data as listed CMP Latest Ref Rng & Units 11/07/2017 11/01/2016 02/04/2013  Glucose 70 - 140 mg/dL 122 - 119(H)  BUN 7 - 26 mg/dL 15 - 22  Creatinine 0.60 - 1.10 mg/dL 0.78 0.70 0.69  Sodium 136 - 145 mmol/L 136 - 134(L)  Potassium 3.5 - 5.1 mmol/L 3.2(L) - 3.5  Chloride 98 - 109 mmol/L 103 - 96  CO2 22 - 29 mmol/L 25 - 27  Calcium 8.4 - 10.4 mg/dL 9.5 - 9.4  Total Protein 6.4 - 8.3 g/dL 6.7 - -  Total Bilirubin 0.2 - 1.2 mg/dL 0.7 - -  Alkaline Phos 40 - 150 U/L 128 - -  AST 5 - 34 U/L 15 - -  ALT 0 - 55 U/L 15 - -    Lab Results  Component Value Date   WBC 8.0 11/07/2017   HGB 12.1 11/07/2017   HCT 34.4 (L) 11/07/2017   MCV 97.5 11/07/2017   PLT 282 11/07/2017   NEUTROABS 5.2 11/07/2017    ASSESSMENT & PLAN:  Monoallelic mutation of CHEK2 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 Based on NCCN guidelines, this is a pathogenic mutation that has  been associated with risk of not only breast cancer but also colon, thyroid and kidney cancers  Breast cancer surveillance: Breast MRI  01/08/2020: No evidence of malignancy Mammogram  05/26/2019: No evidence of malignancy within either breast: Breast density category B  We will plan to do breast MRIs every other year.  Next MRI will be done in 2023.  She will get her mammogram done in October.  Other cancer surveillance: Apart of breast cancer, there are no definite surveillance approaches to kidney cancer. For colon cancer she will need a colonoscopyevery 5 years. For thyroid cancer,she will need manual thyroid examinations for any lumps or nodules.  Return to clinic in 1 year for follow-up.    No orders of the defined types were placed in this encounter.  The patient has a good understanding of the overall plan. she agrees with it. she will call with  any problems that may develop before the next visit here.  Total time spent: 20 mins including face to face time and time spent for planning, charting and coordination of care  Nicholas Lose, MD 02/16/2020  I, Cloyde Reams Dorshimer, am acting as scribe for Dr. Nicholas Lose.  I have reviewed the above documentation for accuracy and completeness, and I agree with the above.

## 2020-02-16 ENCOUNTER — Inpatient Hospital Stay: Payer: Medicare PPO | Attending: Hematology and Oncology | Admitting: Hematology and Oncology

## 2020-02-16 ENCOUNTER — Other Ambulatory Visit: Payer: Self-pay

## 2020-02-16 DIAGNOSIS — Z1509 Genetic susceptibility to other malignant neoplasm: Secondary | ICD-10-CM | POA: Insufficient documentation

## 2020-02-16 DIAGNOSIS — Z1502 Genetic susceptibility to malignant neoplasm of ovary: Secondary | ICD-10-CM

## 2020-02-16 DIAGNOSIS — Z1501 Genetic susceptibility to malignant neoplasm of breast: Secondary | ICD-10-CM | POA: Diagnosis not present

## 2020-02-16 DIAGNOSIS — R922 Inconclusive mammogram: Secondary | ICD-10-CM

## 2020-02-16 DIAGNOSIS — Z1589 Genetic susceptibility to other disease: Secondary | ICD-10-CM

## 2020-02-16 NOTE — Assessment & Plan Note (Signed)
Based on NCCN guidelines, this is a pathogenic mutation that has been associated with risk of not only breast cancer but also colon, thyroid and kidney cancers  Breast cancer surveillance: Breast MRI  01/08/2020: No evidence of malignancy Mammogram  05/26/2019: No evidence of malignancy within either breast: Breast density category B  We will plan to do breast MRIs every other year.  Next MRI will be done in 2023.  She will get her mammogram done in October.  Other cancer surveillance: Apart of breast cancer, there are no definite surveillance approaches to kidney cancer. For colon cancer she will need a colonoscopyevery 5 years. For thyroid cancer,she will need manual thyroid examinations for any lumps or nodules.  Return to clinic in 1 year for follow-up.

## 2020-03-04 DIAGNOSIS — S0083XA Contusion of other part of head, initial encounter: Secondary | ICD-10-CM | POA: Diagnosis not present

## 2020-03-04 DIAGNOSIS — W19XXXA Unspecified fall, initial encounter: Secondary | ICD-10-CM | POA: Diagnosis not present

## 2020-03-15 DIAGNOSIS — S0502XA Injury of conjunctiva and corneal abrasion without foreign body, left eye, initial encounter: Secondary | ICD-10-CM | POA: Diagnosis not present

## 2020-03-17 DIAGNOSIS — S0502XD Injury of conjunctiva and corneal abrasion without foreign body, left eye, subsequent encounter: Secondary | ICD-10-CM | POA: Diagnosis not present

## 2020-03-22 DIAGNOSIS — H16042 Marginal corneal ulcer, left eye: Secondary | ICD-10-CM | POA: Diagnosis not present

## 2020-03-22 DIAGNOSIS — H1789 Other corneal scars and opacities: Secondary | ICD-10-CM | POA: Diagnosis not present

## 2020-04-15 DIAGNOSIS — L439 Lichen planus, unspecified: Secondary | ICD-10-CM | POA: Diagnosis not present

## 2020-04-19 ENCOUNTER — Other Ambulatory Visit: Payer: Self-pay | Admitting: Hematology and Oncology

## 2020-04-19 DIAGNOSIS — Z1231 Encounter for screening mammogram for malignant neoplasm of breast: Secondary | ICD-10-CM

## 2020-04-24 DIAGNOSIS — Z23 Encounter for immunization: Secondary | ICD-10-CM | POA: Diagnosis not present

## 2020-05-15 ENCOUNTER — Ambulatory Visit: Payer: Medicare PPO | Attending: Internal Medicine

## 2020-05-15 DIAGNOSIS — Z23 Encounter for immunization: Secondary | ICD-10-CM

## 2020-05-15 NOTE — Progress Notes (Signed)
   Covid-19 Vaccination Clinic  Name:  Elaine Carter    MRN: [id number] DOB: August 02, 1940  05/15/2020  [name] was observed post Covid-19 immunization for 15 minutes without incident. She was provided with Vaccine Information Sheet and instruction to access the V-Safe system.   [name] was instructed to call 911 with any severe reactions post vaccine: Marland Kitchen Difficulty breathing  . Swelling of face and throat  . A fast heartbeat  . A bad rash all over body  . Dizziness and weakness

## 2020-05-25 DIAGNOSIS — H524 Presbyopia: Secondary | ICD-10-CM | POA: Diagnosis not present

## 2020-05-25 DIAGNOSIS — H40013 Open angle with borderline findings, low risk, bilateral: Secondary | ICD-10-CM | POA: Diagnosis not present

## 2020-05-25 DIAGNOSIS — H43813 Vitreous degeneration, bilateral: Secondary | ICD-10-CM | POA: Diagnosis not present

## 2020-05-25 DIAGNOSIS — H2511 Age-related nuclear cataract, right eye: Secondary | ICD-10-CM | POA: Diagnosis not present

## 2020-05-26 ENCOUNTER — Ambulatory Visit: Payer: Medicare PPO

## 2020-05-26 DIAGNOSIS — L820 Inflamed seborrheic keratosis: Secondary | ICD-10-CM | POA: Diagnosis not present

## 2020-05-26 DIAGNOSIS — T7840XD Allergy, unspecified, subsequent encounter: Secondary | ICD-10-CM | POA: Diagnosis not present

## 2020-06-02 ENCOUNTER — Ambulatory Visit: Payer: Medicare PPO | Admitting: Podiatry

## 2020-06-02 ENCOUNTER — Ambulatory Visit (INDEPENDENT_AMBULATORY_CARE_PROVIDER_SITE_OTHER): Payer: Medicare PPO

## 2020-06-02 ENCOUNTER — Other Ambulatory Visit: Payer: Self-pay

## 2020-06-02 DIAGNOSIS — M2012 Hallux valgus (acquired), left foot: Secondary | ICD-10-CM | POA: Diagnosis not present

## 2020-06-02 DIAGNOSIS — M2041 Other hammer toe(s) (acquired), right foot: Secondary | ICD-10-CM | POA: Diagnosis not present

## 2020-06-02 DIAGNOSIS — S90129A Contusion of unspecified lesser toe(s) without damage to nail, initial encounter: Secondary | ICD-10-CM | POA: Insufficient documentation

## 2020-06-02 DIAGNOSIS — M2011 Hallux valgus (acquired), right foot: Secondary | ICD-10-CM | POA: Diagnosis not present

## 2020-06-02 DIAGNOSIS — M2042 Other hammer toe(s) (acquired), left foot: Secondary | ICD-10-CM | POA: Diagnosis not present

## 2020-06-02 NOTE — Progress Notes (Signed)
   Subjective: 79 y.o. female presenting today presenting for evaluation of pain to the bilateral forefoot.  Patient relates more pain to the left foot versus the right.  She states that the great toe is pushing against the second toe.  She has not done anything for treatment.  She presents for further treatment and evaluation  Past Medical History:  Diagnosis Date  . Arthritis   . Constipation   . Contact lens/glasses fitting    wears contacts or glasses  . Diverticular disease   . Family history of cancer   . Genetic testing 09/01/2016   Test results: 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  Genes tested: 43 genes on [facility]'s Common Cancers panel (APC, ATM, AXIN2, BARD1, BMPR1A, BRCA1, BRCA2, BRIP1, CDH1, CDKN2A, CHEK2, DICER1, EPCAM, GREM1, HOXB13, KIT, MEN1, MLH1, MSH2, MSH6, MUTYH, NBN, NF1, PALB2, PDGFRA, PMS2, POLD1, POLE, PTEN, RAD50, RAD51C, RAD51D, SDHA, SDHB, SDHC, SDHD, SMAD4, SMARCA4,   . GERD (gastroesophageal reflux disease)   . Hypertension   . Monoallelic mutation of CHEK2 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    Pathogenic CHEK2 mutation c.591delA (p.Val198Phefs*7) @ [facility]  . Personal history of breast cancer 1998   left breast; estrogen positive     Objective: Physical Exam General: The patient is alert and oriented x3 in no acute distress.  Dermatology: Skin is cool, dry and supple bilateral lower extremities. Negative for open lesions or macerations.  Vascular: Palpable pedal pulses bilaterally. No edema or erythema noted. Capillary refill within normal limits.  Neurological: Epicritic and protective threshold grossly intact bilaterally.   Musculoskeletal Exam: Clinical evidence of bunion deformity noted to the respective foot. There is moderate pain on palpation range of motion of the first MPJ. Lateral deviation of the hallux noted consistent with hallux abductovalgus. Hammertoe contracture also noted on clinical exam to digits #2 of the  bilateral foot. Symptomatic pain on palpation and range of motion also noted to the metatarsal phalangeal joints of the respective hammertoe digits.    Radiographic Exam: Increased intermetatarsal angle greater than 15 with a hallux abductus angle greater than 30 noted on AP view. Moderate degenerative changes noted within the first MPJ. Contracture deformity also noted to the interphalangeal joints and MPJs of the digits of the respective hammertoes.    Assessment: 1. HAV w/ bunion deformity bilateral 2. Hammertoe deformity bilateral   Plan of Care:  1. Patient was evaluated. X-Rays reviewed. 2.  Today we are going to recommend conservative treatment.  The patient does not want to discuss or consider surgical management. 3.  Recommend wide fitting shoes that do not irritate or constrict the toe box 4.  Silicone toe spacer was provided to alleviate pressure between the first and second digits bilateral 5.  Recommend OTC Tylenol as needed 6.  Return to clinic as needed    Edrick Kins, DPM [facility]  Dr. Edrick Kins, [address]                                        [place], [place] [address]                Office [phone]  Fax [phone]

## 2020-06-10 DIAGNOSIS — L439 Lichen planus, unspecified: Secondary | ICD-10-CM | POA: Diagnosis not present

## 2020-07-01 ENCOUNTER — Ambulatory Visit: Payer: Medicare PPO

## 2020-07-02 ENCOUNTER — Ambulatory Visit
Admission: RE | Admit: 2020-07-02 | Discharge: 2020-07-02 | Disposition: A | Discharge status: Home / Self Care | Payer: Medicare PPO | Source: Ambulatory Visit | Attending: Hematology and Oncology | Admitting: Hematology and Oncology

## 2020-07-02 ENCOUNTER — Other Ambulatory Visit: Payer: Self-pay

## 2020-07-02 DIAGNOSIS — Z1231 Encounter for screening mammogram for malignant neoplasm of breast: Secondary | ICD-10-CM

## 2020-08-05 DIAGNOSIS — E559 Vitamin D deficiency, unspecified: Secondary | ICD-10-CM | POA: Diagnosis not present

## 2020-08-05 DIAGNOSIS — E7800 Pure hypercholesterolemia, unspecified: Secondary | ICD-10-CM | POA: Diagnosis not present

## 2020-08-05 DIAGNOSIS — I10 Essential (primary) hypertension: Secondary | ICD-10-CM | POA: Diagnosis not present

## 2020-08-05 DIAGNOSIS — M1990 Unspecified osteoarthritis, unspecified site: Secondary | ICD-10-CM | POA: Diagnosis not present

## 2020-08-05 DIAGNOSIS — K219 Gastro-esophageal reflux disease without esophagitis: Secondary | ICD-10-CM | POA: Diagnosis not present

## 2020-08-05 DIAGNOSIS — R21 Rash and other nonspecific skin eruption: Secondary | ICD-10-CM | POA: Diagnosis not present

## 2020-08-05 DIAGNOSIS — D692 Other nonthrombocytopenic purpura: Secondary | ICD-10-CM | POA: Diagnosis not present

## 2020-08-24 DIAGNOSIS — L308 Other specified dermatitis: Secondary | ICD-10-CM | POA: Diagnosis not present

## 2020-09-20 ENCOUNTER — Ambulatory Visit: Payer: Medicare PPO | Admitting: Allergy & Immunology

## 2020-09-22 ENCOUNTER — Other Ambulatory Visit: Payer: Self-pay

## 2020-09-22 ENCOUNTER — Ambulatory Visit: Payer: Medicare PPO | Admitting: Podiatry

## 2020-09-22 DIAGNOSIS — M2042 Other hammer toe(s) (acquired), left foot: Secondary | ICD-10-CM

## 2020-09-22 DIAGNOSIS — M2011 Hallux valgus (acquired), right foot: Secondary | ICD-10-CM

## 2020-09-22 DIAGNOSIS — M2041 Other hammer toe(s) (acquired), right foot: Secondary | ICD-10-CM | POA: Diagnosis not present

## 2020-09-22 DIAGNOSIS — M2012 Hallux valgus (acquired), left foot: Secondary | ICD-10-CM | POA: Diagnosis not present

## 2020-09-22 NOTE — Progress Notes (Signed)
   Subjective: 80 y.o. female presenting today presenting for follow-up evaluation of pain to the bilateral forefoot.  Patient relates more pain to the left foot versus the right.  She states that the great toe is pushing against the second toe.  She has not done anything for treatment.  She presents for further treatment and evaluation  Past Medical History:  Diagnosis Date  . Arthritis   . Constipation   . Contact lens/glasses fitting    wears contacts or glasses  . Diverticular disease   . Family history of cancer   . Genetic testing 09/01/2016   Test results: 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  Genes tested: 43 genes on [facility]'s Common Cancers panel (APC, ATM, AXIN2, BARD1, BMPR1A, BRCA1, BRCA2, BRIP1, CDH1, CDKN2A, CHEK2, DICER1, EPCAM, GREM1, HOXB13, KIT, MEN1, MLH1, MSH2, MSH6, MUTYH, NBN, NF1, PALB2, PDGFRA, PMS2, POLD1, POLE, PTEN, RAD50, RAD51C, RAD51D, SDHA, SDHB, SDHC, SDHD, SMAD4, SMARCA4,   . GERD (gastroesophageal reflux disease)   . Hypertension   . Monoallelic mutation of CHEK2 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    Pathogenic CHEK2 mutation c.591delA (p.Val198Phefs*7) @ [facility]  . Personal history of breast cancer 1998   left breast; estrogen positive     Objective: Physical Exam General: The patient is alert and oriented x3 in no acute distress.  Dermatology: Skin is cool, dry and supple bilateral lower extremities. Negative for open lesions or macerations.  Vascular: Palpable pedal pulses bilaterally. No edema or erythema noted. Capillary refill within normal limits.  Neurological: Epicritic and protective threshold grossly intact bilaterally.   Musculoskeletal Exam: Clinical evidence of bunion deformity noted to the respective foot. There is moderate pain on palpation range of motion of the first MPJ. Lateral deviation of the hallux noted consistent with hallux abductovalgus. Hammertoe contracture also noted on clinical exam to digits #2  of the bilateral foot. Symptomatic pain on palpation and range of motion also noted to the metatarsal phalangeal joints of the respective hammertoe digits.    Radiographic Exam: Increased intermetatarsal angle greater than 15 with a hallux abductus angle greater than 30 noted on AP view. Moderate degenerative changes noted within the first MPJ. Contracture deformity also noted to the interphalangeal joints and MPJs of the digits of the respective hammertoes.    Assessment: 1. HAV w/ bunion deformity bilateral 2. Hammertoe deformity bilateral   Plan of Care:  1. Patient was evaluated. X-Rays reviewed. 2.  Today we are going to recommend conservative treatment.  The patient does not want to discuss or consider surgical management. 3.  Recommend wide fitting shoes that do not irritate or constrict the toe box.  Recommend shoes at [employer] 4.  Silicone toe spacer was provided to alleviate pressure between the first and second digits bilateral 5.  Recommend OTC Tylenol as needed 6.  Return to clinic as needed    Edrick Kins, DPM [facility]  Dr. Edrick Kins, [address]                                        [place], [place] [address]                Office [phone]  Fax [phone]

## 2020-10-13 DIAGNOSIS — S51811A Laceration without foreign body of right forearm, initial encounter: Secondary | ICD-10-CM | POA: Diagnosis not present

## 2020-10-20 DIAGNOSIS — I10 Essential (primary) hypertension: Secondary | ICD-10-CM | POA: Diagnosis not present

## 2020-10-20 DIAGNOSIS — S51811D Laceration without foreign body of right forearm, subsequent encounter: Secondary | ICD-10-CM | POA: Diagnosis not present

## 2020-10-26 ENCOUNTER — Ambulatory Visit: Payer: Medicare PPO | Admitting: Allergy & Immunology

## 2020-11-09 DIAGNOSIS — L280 Lichen simplex chronicus: Secondary | ICD-10-CM | POA: Diagnosis not present

## 2020-11-09 DIAGNOSIS — L308 Other specified dermatitis: Secondary | ICD-10-CM | POA: Diagnosis not present

## 2020-11-30 ENCOUNTER — Ambulatory Visit: Payer: Medicare PPO | Admitting: Allergy & Immunology

## 2020-12-13 ENCOUNTER — Ambulatory Visit: Payer: Medicare PPO | Admitting: Podiatry

## 2020-12-13 ENCOUNTER — Other Ambulatory Visit: Payer: Self-pay

## 2020-12-13 DIAGNOSIS — M2041 Other hammer toe(s) (acquired), right foot: Secondary | ICD-10-CM

## 2020-12-13 DIAGNOSIS — M2042 Other hammer toe(s) (acquired), left foot: Secondary | ICD-10-CM

## 2020-12-13 DIAGNOSIS — M2012 Hallux valgus (acquired), left foot: Secondary | ICD-10-CM | POA: Diagnosis not present

## 2020-12-13 DIAGNOSIS — M2011 Hallux valgus (acquired), right foot: Secondary | ICD-10-CM | POA: Diagnosis not present

## 2020-12-13 NOTE — Progress Notes (Signed)
Subjective: 80 y.o. female presenting today presenting for follow-up evaluation of pain to the bilateral forefoot.  Patient relates more pain to the left foot versus the right.  She states that the great toe is pushing against the second toe.  She has not done anything for treatment.  She presents for further treatment and evaluation  Past Medical History:  Diagnosis Date  . Arthritis   . Constipation   . Contact lens/glasses fitting    wears contacts or glasses  . Diverticular disease   . Family history of cancer   . Genetic testing 09/01/2016   Test results: 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  Genes tested: 43 genes on [facility]'s Common Cancers panel (APC, ATM, AXIN2, BARD1, BMPR1A, BRCA1, BRCA2, BRIP1, CDH1, CDKN2A, CHEK2, DICER1, EPCAM, GREM1, HOXB13, KIT, MEN1, MLH1, MSH2, MSH6, MUTYH, NBN, NF1, PALB2, PDGFRA, PMS2, POLD1, POLE, PTEN, RAD50, RAD51C, RAD51D, SDHA, SDHB, SDHC, SDHD, SMAD4, SMARCA4,   . GERD (gastroesophageal reflux disease)   . Hypertension   . Monoallelic mutation of CHEK2 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    Pathogenic CHEK2 mutation c.591delA (p.Val198Phefs*7) @ [facility]  . Personal history of breast cancer 1998   left breast; estrogen positive     Objective: Physical Exam General: The patient is alert and oriented x3 in no acute distress.  Dermatology: Skin is cool, dry and supple bilateral lower extremities. Negative for open lesions or macerations.  Vascular: Palpable pedal pulses bilaterally. No edema or erythema noted. Capillary refill within normal limits.  Neurological: Epicritic and protective threshold grossly intact bilaterally.   Musculoskeletal Exam: Clinical evidence of bunion deformity noted to the respective foot. There is moderate pain on palpation range of motion of the first MPJ. Lateral deviation of the hallux noted consistent with hallux abductovalgus. Hammertoe contracture also noted on clinical exam to digits #2  of the bilateral foot. Symptomatic pain on palpation and range of motion also noted to the metatarsal phalangeal joints of the respective hammertoe digits.    Radiographic Exam taken prior visit: Increased intermetatarsal angle greater than 15 with a hallux abductus angle greater than 30 noted on AP view. Moderate degenerative changes noted within the first MPJ. Contracture deformity also noted to the interphalangeal joints and MPJs of the digits of the respective hammertoes.    Assessment: 1. HAV w/ bunion deformity bilateral 2. Hammertoe deformity bilateral   Plan of Care:  1. Patient was evaluated. X-Rays reviewed. 2.  Today we are going to recommend conservative treatment.  The patient does not want to discuss or consider surgical management. 3.  Recommend wide fitting shoes that do not irritate or constrict the toe box.  Recommend shoes at [employer] 4.  Silicone toe spacer was provided to alleviate pressure between the first and second digits bilateral 5.  Recommend OTC Tylenol as needed 6.  Hammertoe splints and silicone cushions were provided for the patient today.  Wear as needed  7.  Return to clinic as needed    Edrick Kins, DPM [facility]  Dr. Edrick Kins, [address]                                        [place], [place] [address]                Office [phone]  Fax [phone]

## 2020-12-30 ENCOUNTER — Ambulatory Visit: Payer: Medicare PPO | Admitting: Allergy & Immunology

## 2021-01-09 DIAGNOSIS — L03115 Cellulitis of right lower limb: Secondary | ICD-10-CM | POA: Diagnosis not present

## 2021-01-24 DIAGNOSIS — S81819A Laceration without foreign body, unspecified lower leg, initial encounter: Secondary | ICD-10-CM | POA: Diagnosis not present

## 2021-02-13 NOTE — Progress Notes (Signed)
Elaine Carter  Patient Care Team: Gaynelle Arabian, MD as PCP - General (Family Medicine)  DIAGNOSIS:    ICD-10-CM   1. Monoallelic mutation of CHEK2 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  Z15.01    Z15.89    Z15.09    Z15.02       CHIEF COMPLIANT: Surveillance of high risk breast cancer Chek 2 mutation  INTERVAL HISTORY: Elaine Carter is a 80 y.o. with above-mentioned history of Chek 2 mutation who is currently on surveillance. Mammogram on 07/02/20 showed no evidence of malignancy bilaterally. She reports to the clinic today for follow-up.  She denies any lumps or nodules in the breast.  Mammograms has been coming out really well.  ALLERGIES:  is allergic to codeine, sulfa antibiotics, tape, and dover two-sided adhesive strap [attends briefs small].  MEDICATIONS:  Current Outpatient Medications  Medication Sig Dispense Refill   amLODipine (NORVASC) 5 MG tablet Take 5 mg by mouth daily.     aspirin EC 81 MG tablet Take 81 mg by mouth daily.     calcium citrate-vitamin D (CITRACAL+D) 315-200 MG-UNIT tablet Take 1 tablet by mouth daily.     chlorpheniramine (CHLOR-TRIMETON) 4 MG tablet Take 4 mg by mouth 2 (two) times daily as needed for allergies.     cholecalciferol (VITAMIN D) 1000 UNITS tablet Take 1,000 Units by mouth daily. 2000u     clobetasol (TEMOVATE) 0.05 % external solution      diltiazem (CARTIA XT) 240 MG 24 hr capsule Cartia XT 240 mg capsule,extended release     diltiazem (TIAZAC) 180 MG 24 hr capsule diltiazem CD 180 mg capsule,extended release 24 hr     fluticasone (CUTIVATE) 0.05 % cream      losartan (COZAAR) 100 MG tablet Take 100 mg by mouth daily.     meloxicam (MOBIC) 15 MG tablet Take 15 mg by mouth daily.     ofloxacin (OCUFLOX) 0.3 % ophthalmic solution      omeprazole (PRILOSEC) 20 MG capsule Take 20 mg by mouth daily.     polycarbophil (FIBERCON) 625 MG tablet Take 625 mg by mouth as needed.     triamcinolone cream (KENALOG) 0.1 %      No current facility-administered  medications for this visit.    PHYSICAL EXAMINATION: ECOG PERFORMANCE STATUS: 1 - Symptomatic but completely ambulatory  Vitals:   02/14/21 1519  BP: (!) 181/67  Pulse: 67  Resp: 18  Temp: 97.6 F (36.4 C)  SpO2: 100%   Filed Weights   02/14/21 1519  Weight: 121 lb 6.4 oz (55.1 kg)    BREAST: No palpable masses or nodules in either right or left breasts. No palpable axillary supraclavicular or infraclavicular adenopathy no breast tenderness or nipple discharge. (exam performed in the presence of a chaperone)  LABORATORY DATA:  I have reviewed the data as listed CMP Latest Ref Rng & Units 11/07/2017 11/01/2016 02/04/2013  Glucose 70 - 140 mg/dL 122 - 119(H)  BUN 7 - 26 mg/dL 15 - 22  Creatinine 0.60 - 1.10 mg/dL 0.78 0.70 0.69  Sodium 136 - 145 mmol/L 136 - 134(L)  Potassium 3.5 - 5.1 mmol/L 3.2(L) - 3.5  Chloride 98 - 109 mmol/L 103 - 96  CO2 22 - 29 mmol/L 25 - 27  Calcium 8.4 - 10.4 mg/dL 9.5 - 9.4  Total Protein 6.4 - 8.3 g/dL 6.7 - -  Total Bilirubin 0.2 - 1.2 mg/dL 0.7 - -  Alkaline Phos 40 - 150 U/L 128 - -  AST 5 -  34 U/L 15 - -  ALT 0 - 55 U/L 15 - -    Lab Results  Component Value Date   WBC 8.0 11/07/2017   HGB 12.1 11/07/2017   HCT 34.4 (L) 11/07/2017   MCV 97.5 11/07/2017   PLT 282 11/07/2017   NEUTROABS 5.2 11/07/2017    ASSESSMENT & PLAN:  Monoallelic mutation of CHEK2 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 Based on NCCN guidelines, this is a pathogenic mutation that has been associated with risk of not only breast cancer but also colon, thyroid and kidney cancers   Breast cancer surveillance: Breast MRI  01/08/2020: No evidence of malignancy Mammogram 07/06/20: No evidence of malignancy within either breast: Breast density category B   We will plan to do breast MRIs every other year.  Next MRI will be done in July 2023.      Other cancer surveillance: Apart of breast cancer, there are no definite surveillance approaches to kidney cancer. For colon cancer she will  need a colonoscopy  every 5 years. For thyroid cancer, she will need manual thyroid examinations for any lumps or nodules.   Return to clinic in 1 year for follow-up.    No orders of the defined types were placed in this encounter.  The patient has a good understanding of the overall plan. she agrees with it. she will call with any problems that may develop before the next visit here.  Total time spent: 20 mins including face to face time and time spent for planning, charting and coordination of care  Vinay K Gudena, MD, MPH 02/14/2021  I, Kirstyn Evans, am acting as scribe for Dr. Vinay Gudena.  I have reviewed the above documentation for accuracy and completeness, and I agree with the above.       

## 2021-02-14 ENCOUNTER — Inpatient Hospital Stay: Payer: Medicare PPO | Attending: Hematology and Oncology | Admitting: Hematology and Oncology

## 2021-02-14 ENCOUNTER — Other Ambulatory Visit: Payer: Self-pay

## 2021-02-14 DIAGNOSIS — Z1502 Genetic susceptibility to malignant neoplasm of ovary: Secondary | ICD-10-CM | POA: Diagnosis not present

## 2021-02-14 DIAGNOSIS — Z1589 Genetic susceptibility to other disease: Secondary | ICD-10-CM | POA: Diagnosis not present

## 2021-02-14 DIAGNOSIS — Z0389 Encounter for observation for other suspected diseases and conditions ruled out: Secondary | ICD-10-CM

## 2021-02-14 DIAGNOSIS — Z1509 Genetic susceptibility to other malignant neoplasm: Secondary | ICD-10-CM | POA: Diagnosis not present

## 2021-02-14 DIAGNOSIS — Z1501 Genetic susceptibility to malignant neoplasm of breast: Secondary | ICD-10-CM | POA: Diagnosis not present

## 2021-02-14 MED ORDER — CALCIUM CITRATE-VITAMIN D 315-200 MG-UNIT PO TABS: 1.0000 | ORAL_TABLET | Freq: Every day | ORAL | Status: DC

## 2021-02-14 NOTE — Assessment & Plan Note (Signed)
Based on NCCN guidelines, this is a pathogenic mutation that has been associated with risk of not only breast cancer but also colon, thyroid and kidney cancers  Breast cancer surveillance: Breast MRI 01/08/2020:No evidence of malignancy Mammogram 07/06/20: No evidence of malignancy within either breast: Breast density category B  We will plan to do breast MRIs every other year. Next MRI will be done in 2023.    Other cancer surveillance: Apart of breast cancer, there are no definite surveillance approaches to kidney cancer. For colon cancer she will need a colonoscopyevery 5 years. For thyroid cancer,she will need manual thyroid examinations for any lumps or nodules.  Return to clinic in 1 year for follow-up.

## 2021-02-15 ENCOUNTER — Ambulatory Visit: Payer: Medicare PPO | Admitting: Hematology and Oncology

## 2021-02-15 ENCOUNTER — Ambulatory Visit: Payer: Medicare PPO | Admitting: Allergy & Immunology

## 2021-02-15 ENCOUNTER — Encounter: Payer: Self-pay | Admitting: Allergy & Immunology

## 2021-02-15 VITALS — BP 124/78 | HR 67 | Temp 98.4°F | Resp 16 | Ht 60.0 in | Wt 121.2 lb

## 2021-02-15 DIAGNOSIS — R21 Rash and other nonspecific skin eruption: Secondary | ICD-10-CM | POA: Diagnosis not present

## 2021-02-15 DIAGNOSIS — L299 Pruritus, unspecified: Secondary | ICD-10-CM | POA: Diagnosis not present

## 2021-02-15 DIAGNOSIS — L2089 Other atopic dermatitis: Secondary | ICD-10-CM | POA: Diagnosis not present

## 2021-02-15 MED ORDER — HYDROXYZINE HCL 25 MG PO TABS: 25.0000 mg | ORAL_TABLET | Freq: Every evening | ORAL | 5 refills | Status: DC

## 2021-02-15 MED ORDER — LEVOCETIRIZINE DIHYDROCHLORIDE 5 MG PO TABS
10.0000 mg | ORAL_TABLET | Freq: Every evening | ORAL | 5 refills | Status: DC
Start: 2021-02-15 — End: 2024-03-22

## 2021-02-15 MED ORDER — TRIAMCINOLONE ACETONIDE 0.1 % EX OINT: 1.0000 "application " | TOPICAL_OINTMENT | Freq: Two times a day (BID) | CUTANEOUS | 3 refills | Status: AC

## 2021-02-15 NOTE — Progress Notes (Signed)
NEW PATIENT  Date of Service/Encounter:  02/15/21  Consult requested by: Gaynelle Arabian, MD   Assessment:   Rash - with negative food and environmental allergy testing today  Biopsy consistent with early eczema versus drug reaction   Ms. Raulerson presents for an evaluation of a rash which has been ongoing for a period of 2 years.  It seems to have been triggered by administration of Bactrim, which led to a rash.  She does not have pictures of this.  She does show me pictures from the fall 2021, more than a year after the onset of the itching and rash, and this seems more consistent with urticaria.  However, the rash she has today is certainly not urticaria.  It could be eczema as the biopsies suggest, so I think the next best step would be to try Dupixent.  She has been on betamethasone, fluticasone, triamcinolone, and hydrocortisone.  I am unclear whether she has been on Protopic or Elidel, but regardless, we are going to treat this more aggressively with the use of triamcinolone mixed with Eucerin twice daily, weaning down to 3 times weekly.  If nothing else, maybe the [place] will help with the intense pruritus which is affecting her quality of life.  Plan/Recommendations:    1. Rash - Testing was negative to the most common foods, which rules out 95% of all food allergens. - Testing was negative to the indoor and outdoor allergens.  - Copy of testing results provided.  - Restart the Xyzal one tablet twice daily. - I would like to start hydroxyzine 25m at night (to help with itching and sleeping).  - Start triamcinolone mixed with Eucerin two daily for two weeks to the affected areas, once daily for two weeks to the affected areas, and then three times weekly.  - Consider starting Dupixent for control of atopic dermatitis, which can help a lot with the itching/rash.   2. Return in about 6 weeks (around 03/29/2021).   This note in its entirety was forwarded to the Provider who  requested this consultation.  Subjective:   Elaine TEIGENis a 80y.o. female presenting today for evaluation of  Chief Complaint  Patient presents with   Rash    Has been having issues with rashes since October of 2020. She was diagnosed with scabies in 2020 by a dermatologist. She possibly got it from a hotel she vacationed at. Some symptoms but not as bad.    Other    Was on shoulders, neck, back, and scalp. 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  Elaine Bambergwas referred by EGaynelle Arabian MD.     CJarvis Newcomer(DERMATOLOGIST)  Elaine Carter a 80y.o. female presenting for an evaluation of itching/rash . 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 She misses the children. 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 [place]   She reports that she had  a rash that has largely improved. She was diagnosed with scabies by a Dermatologist that works with Dr. Nevada Crane. She thinks she caught it in a hotel at [employer] in October 2020. She was itching over her entire body. She was treated for scabies. Scabies treatment did not seem to help. She also reports that the summer 2020, she was given a sulfa antibiotic and she broke out in a terrible rash. This was likely the beginning of the entire episode. She has received prednisone on a number of occasions. She estimates that she had prednisone a total of three courses in total of various lengths.   Overall her symptoms have gotten better over time.  She was diagnosed with "dry skin" over the winter and was told to use a humidifier. She has been using it nightly and this seems to help her skin. She has been on Xyzal daily but she stopped for the appointment today.  She does have some more rhinorrhea without the Xyzal on board. She has been on "some other tablet" as well. She was told to use Xyzal twice daily. She is using Tylenol to help with the itching. She is using the Tylenol multiple times per  day to help with the itching. It also helps with the "aches and pains". She has Mobic to help with joint pain as well.  She has been on that for years, well before the itching started in 2020. She thinks that she had that back when she was teaching. She is also on omeprazole for her GERD.   She has a history of lichen planus and swishes and spits dexamethasone. She has TAC cream 0.1% cream. She also has betamethasone for her rash. She has Arnicare (an OTC homeopathic cream). She has fluticasone cream for her hemorrhoid. She is also doing Cerve. She reports that she does wake up scratching in the middle of the night. She has not been on hydroxyzine.  She has been having some trouble sleeping with all of the itching.   She seems to tolerate all of the major food allergens without adverse event. She has not noticed that any particular food seems to make it better. She has a wide variety of foods in her diet.   She did have a biopsy performed in February 2021 that demonstrated spongiosis thought to be a possible early eczematous dermatitis versus an arthropod bite or drug reaction.  This was a biopsy of her scalp.  It demonstrated an infiltrate of lymphocytes, histiocytes, and scattered eosinophils.  Otherwise, there is no history of other atopic diseases, including asthma, food allergies, drug allergies, environmental allergies, stinging insect allergies, or contact dermatitis. There is no significant infectious history. Vaccinations are up to date.    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 If you have any questions, ask your nurse or doctor.  STOP taking these medications    triamcinolone cream 0.1 % Commonly known as: KENALOG Replaced by: triamcinolone ointment 0.1 % Stopped by: [name], MD       TAKE these medications    amLODipine 5 MG tablet Commonly known as: NORVASC Take 5 mg by mouth daily.   aspirin EC 81 MG tablet Take 81 mg by mouth daily.   calcium citrate-vitamin D 315-200 MG-UNIT tablet Commonly known as: CITRACAL+D Take 1 tablet by mouth daily.   Cartia XT 240 MG 24 hr capsule Generic drug: diltiazem Cartia XT 240 mg capsule,extended release   chlorpheniramine 4 MG tablet Commonly known as: CHLOR-TRIMETON Take 4 mg by mouth 2 (two) times daily as needed for allergies.   cholecalciferol 1000 units tablet Commonly known as: VITAMIN D Take 1,000 Units by mouth daily. 2000u   clobetasol 0.05 % external solution Commonly known as: TEMOVATE   diltiazem 180 MG 24 hr capsule Commonly known as: TIAZAC diltiazem CD 180 mg capsule,extended release 24 hr   fluticasone 0.05 % cream Commonly known as: CUTIVATE   hydrOXYzine 25 MG tablet Commonly known as: ATARAX/VISTARIL Take 1 tablet (25 mg total) by mouth every evening. Started by: [name], MD   levocetirizine 5 MG tablet Commonly known as: XYZAL Take 2 tablets (10 mg total) by mouth every  evening. Started by: [name], MD   losartan 100 MG tablet Commonly known as: COZAAR Take 100 mg by mouth daily.   meloxicam 15 MG tablet Commonly known as: MOBIC Take 15 mg by mouth daily.   ofloxacin 0.3 % ophthalmic solution Commonly known as: OCUFLOX   omeprazole 20 MG capsule Commonly known as: PRILOSEC Take 20 mg by mouth daily.   polycarbophil 625 MG tablet Commonly known as: FIBERCON Take 625 mg by mouth as needed.   triamcinolone ointment 0.1 % Commonly known as: KENALOG Apply 1 application topically 2 (two) times daily. Replaces: triamcinolone cream 0.1 % Started by: [name], MD        Birth History: non-contributory  Developmental History: non-contributory  Past Surgical History: Past Surgical History:  Procedure Laterality Date   ABDOMINAL HYSTERECTOMY  2012   vag hystBSO   ANTERIOR AND POSTERIOR REPAIR  2012   bladder tape sliong-cysto along with hyst   BREAST BIOPSY  1998   lt   BREAST LUMPECTOMY  1998   left   COLONOSCOPY     x3   LIPOMA EXCISION Left 02/06/2013   Procedure: EXCISION OF LEFT UPPER ARM LIPOMA;  Surgeon: Theodoro Kos, DO;  Location: [facility];  Service: Plastics;  Laterality: Left;   TUBAL LIGATION       Family History: Family History  Problem Relation Age of Onset   Prostate cancer Maternal Uncle        Dx 64s; deceased 17s   Stomach cancer Paternal Uncle        Dx 16s; deceased 50   Cancer Maternal Uncle        unk. type   Cancer Maternal Uncle        throat ca (smoker); deceased 56s   Leukemia Cousin        mat female 1st cousin; deceased 45   Breast cancer Other        mat grandmother's sister     Social History: Elaine Carter lives at home by herself in a condo.  She lives in a [place] that is 79 years old.  There is wood in the  main living areas and carpeting in the bedroom.  She has electric heating and central cooling.  There are no animals inside or outside of the home.  She  does not have dust mite covers on her bedding.  There is no tobacco exposure.  She is not exposed to fumes, chemicals, or dust.  They do not use a HEPA filter.  She does not live near an interstate or industrial area.  She was a smoker through 46 and quit at age 62.   Review of Systems  Constitutional: Negative.  Negative for chills, fever, malaise/fatigue and weight loss.  HENT:  Negative for congestion, ear discharge, ear pain and sore throat.   Eyes:  Negative for pain, discharge and redness.  Respiratory:  Negative for cough, sputum production, shortness of breath, wheezing and stridor.   Cardiovascular: Negative.  Negative for chest pain and palpitations.  Gastrointestinal:  Negative for abdominal pain, heartburn, nausea and vomiting.  Skin:  Positive for itching and rash.  Neurological:  Negative for dizziness and headaches.  Endo/Heme/Allergies:  Negative for environmental allergies. Does not bruise/bleed easily.      Objective:   Blood pressure 124/78, pulse 67, temperature 98.4 F (36.9 C), resp. rate 16, height 5' (1.524 m), weight 121 lb 3.2 oz (55 kg), SpO2 99 %. Body mass index is 23.67 kg/m.   Physical Exam:   Physical Exam Constitutional:      Appearance: She is well-developed.  HENT:     Head: Normocephalic and atraumatic.     Right Ear: Tympanic membrane, ear canal and external ear normal. No drainage, swelling or tenderness. Tympanic membrane is not injected, scarred, erythematous, retracted or bulging.     Left Ear: Tympanic membrane, ear canal and external ear normal. No drainage, swelling or tenderness. Tympanic membrane is not injected, scarred, erythematous, retracted or bulging.     Nose: No nasal deformity, septal deviation, mucosal edema or rhinorrhea.     Right Sinus: No maxillary sinus tenderness or frontal sinus tenderness.     Left Sinus: No maxillary sinus tenderness or frontal sinus tenderness.     Mouth/Throat:     Mouth: Mucous membranes are  not pale and not dry.     Pharynx: Uvula midline.  Eyes:     General:        Right eye: No discharge.        Left eye: No discharge.     Conjunctiva/sclera: Conjunctivae normal.     Right eye: Right conjunctiva is not injected. No chemosis.    Left eye: Left conjunctiva is not injected. No chemosis.    Pupils: Pupils are equal, round, and reactive to light.  Cardiovascular:     Rate and Rhythm: Normal rate and regular rhythm.     Heart sounds: Normal heart sounds.  Pulmonary:     Effort: Pulmonary effort is normal. No tachypnea, accessory muscle usage or respiratory distress.     Breath sounds: Normal breath sounds. No wheezing, rhonchi or rales.  Chest:     Chest wall: No tenderness.  Abdominal:     Tenderness: There is no abdominal tenderness. There is no guarding or rebound.  Lymphadenopathy:     Head:     Right side of head: No submandibular, tonsillar or occipital adenopathy.     Left side of head: No submandibular, tonsillar or occipital adenopathy.     Cervical: No cervical adenopathy.  Skin:    General: Skin is warm.     Capillary Refill: Capillary  refill takes less than 2 seconds.     Coloration: Skin is not pale.     Findings: No abrasion, erythema, petechiae or rash. Rash is not papular, urticarial or vesicular.     Comments: Multiple open lesions on the legs in particular.  She does have some bruising as well secondary to her anticoagulation.  She has multiple excoriation marks.  She has some healing lesions with some eschar formation.  I do not see any of the urticaria today which she shows me from a picture of the fall 2021.  This is all resolved with the use of the prednisone evidently.  Neurological:     Mental Status: She is alert.     Diagnostic studies:   Allergy Studies:     Airborne Adult Perc - 02/15/21 1046     Time Antigen Placed 1046    Allergen Manufacturer Lavella Hammock    Location Back    Number of Test 59    1. Control-Buffer 50% Glycerol Negative     2. Control-Histamine 1 mg/ml 2+    3. Albumin saline Negative    4. Toast Negative    5. [place] Negative    6. Johnson Negative    7. [place] Blue Negative    8. Meadow Fescue Negative    9. Perennial Rye Negative    10. Sweet Vernal Negative    11. Timothy Negative    12. Cocklebur Negative    13. Burweed Marshelder Negative    14. Ragweed, short Negative    15. Ragweed, Giant Negative    16. Plantain,  English Negative    17. Lamb's Quarters Negative    18. Sheep Sorrell Negative    19. Rough Pigweed Negative    20. Marsh Elder, Rough Negative    21. Mugwort, Common Negative    22. Ash mix Negative    23. Birch mix Negative    24. Beech American Negative    25. Box, Elder Negative    26. Cedar, red Negative    27. Cottonwood, [place] Negative    28. Elm mix Negative    29. Hickory Negative    30. Maple mix Negative    31. Oak, [place] mix Negative    32. Pecan Pollen Negative    33. Pine mix Negative    34. Sycamore Eastern Negative    35. [place], Black Pollen Negative    36. Alternaria alternata Negative    37. Cladosporium Herbarum Negative    38. Aspergillus mix Negative    39. Penicillium mix Negative    40. Bipolaris sorokiniana (Helminthosporium) Negative    41. Drechslera spicifera (Curvularia) Negative    42. Mucor plumbeus Negative    43. Fusarium moniliforme Negative    44. Aureobasidium pullulans (pullulara) Negative    45. Rhizopus oryzae Negative    46. Botrytis cinera Negative    47. Epicoccum nigrum Negative    48. Phoma betae Negative    49. Candida Albicans Negative    50. Trichophyton mentagrophytes Negative    51. Mite, D Farinae  5,000 AU/ml Negative    52. Mite, D Pteronyssinus  5,000 AU/ml Negative    53. Cat Hair 10,000 BAU/ml Negative    54.  Dog Epithelia Negative    55. Mixed Feathers Negative    56. Horse Epithelia Negative    57. Cockroach, German Negative    58. Mouse Negative    59. Tobacco Leaf Negative  Food Perc - 02/15/21 1046       Test Information   Time Antigen Placed 1046    Allergen Manufacturer Lavella Hammock    Location Back    Number of allergen test 10      Food   1. Peanut Negative    2. Soybean food Negative    3. Wheat, whole Negative    4. Sesame Negative    5. Milk, cow Negative    6. Egg White, chicken Negative    7. Casein Negative    8. Shellfish mix Negative    9. Fish mix Negative    10. Cashew Negative             Intradermal - 02/15/21 1154     Time Antigen Placed 1154    Allergen Manufacturer Lavella Hammock    Location Arm    Number of Test 15    Control Negative    Guatemala Negative    Johnson Negative    7 Grass Negative    Ragweed mix Negative    Weed mix Negative    Tree mix Negative    Mold 1 Negative    Mold 2 Negative    Mold 3 Negative    Mold 4 Negative    Cat Negative    Dog Negative    Cockroach Negative    Mite mix Negative             Allergy testing results were read and interpreted by myself, documented by clinical staff.         Salvatore Marvel, MD [facility]

## 2021-02-15 NOTE — Patient Instructions (Addendum)
1. Rash - Testing was negative to the most common foods, which rules out 95% of all food allergens. - Testing was negative to the indoor and outdoor allergens.  - Copy of testing results provided.  - Restart the Xyzal one tablet twice daily. - I would like to start hydroxyzine 25mg  at night (to help with itching and sleeping).  - Start triamcinolone mixed with Eucerin two daily for two weeks to the affected areas, once daily for two weeks to the affected areas, and then three times weekly.  - Consider starting Dupixent for control of atopic dermatitis, which can help a lot with the itching/rash.   2. Return in about 6 weeks (around 03/29/2021).    Please inform us of any Emergency Department visits, hospitalizations, or changes in symptoms. Call us before going to the ED for breathing or allergy symptoms since we might be able to fit you in for a sick visit. Feel free to contact us anytime with any questions, problems, or concerns.  It was a pleasure to meet you today!  Websites that have reliable patient information: 1. American Academy of Asthma, Allergy, and Immunology: www.aaaai.org 2. Food Allergy Research and Education (FARE): foodallergy.org 3. Mothers of Asthmatics: http://www.asthmacommunitynetwork.org 4. American College of Allergy, Asthma, and Immunology: www.acaai.org   COVID-19 Vaccine Information can be found at: ShippingScam.[email or web address].uk For questions related to vaccine distribution or appointments, please email [email or web address] or call [phone].   We realize that you might be concerned about having an allergic reaction to the COVID19 vaccines. To help with that concern, WE ARE OFFERING THE COVID19 VACCINES IN OUR OFFICE! Ask the front desk for dates!     "Like" [place] on Facebook and Instagram for our latest updates!      A healthy democracy works best when [employer] participate! Make sure you are registered to  vote! If you have moved or changed any of your contact information, you will need to get this updated before voting!  In some cases, you MAY be able to register to vote online: [email or web address]     [place]! [place]! Election day is Tuesday July 26th!

## 2021-02-16 DIAGNOSIS — E7800 Pure hypercholesterolemia, unspecified: Secondary | ICD-10-CM | POA: Diagnosis not present

## 2021-02-16 DIAGNOSIS — Z1389 Encounter for screening for other disorder: Secondary | ICD-10-CM | POA: Diagnosis not present

## 2021-02-16 DIAGNOSIS — M1990 Unspecified osteoarthritis, unspecified site: Secondary | ICD-10-CM | POA: Diagnosis not present

## 2021-02-16 DIAGNOSIS — E559 Vitamin D deficiency, unspecified: Secondary | ICD-10-CM | POA: Diagnosis not present

## 2021-02-16 DIAGNOSIS — K219 Gastro-esophageal reflux disease without esophagitis: Secondary | ICD-10-CM | POA: Diagnosis not present

## 2021-02-16 DIAGNOSIS — I10 Essential (primary) hypertension: Secondary | ICD-10-CM | POA: Diagnosis not present

## 2021-02-16 DIAGNOSIS — R21 Rash and other nonspecific skin eruption: Secondary | ICD-10-CM | POA: Diagnosis not present

## 2021-02-16 DIAGNOSIS — D692 Other nonthrombocytopenic purpura: Secondary | ICD-10-CM | POA: Diagnosis not present

## 2021-02-16 DIAGNOSIS — Z0000 Encounter for general adult medical examination without abnormal findings: Secondary | ICD-10-CM | POA: Diagnosis not present

## 2021-03-05 DIAGNOSIS — W0110XA Fall on same level from slipping, tripping and stumbling with subsequent striking against unspecified object, initial encounter: Secondary | ICD-10-CM | POA: Diagnosis not present

## 2021-03-05 DIAGNOSIS — S0101XA Laceration without foreign body of scalp, initial encounter: Secondary | ICD-10-CM | POA: Diagnosis not present

## 2021-04-07 ENCOUNTER — Ambulatory Visit: Payer: Medicare PPO | Admitting: Allergy & Immunology

## 2021-04-10 DIAGNOSIS — S51011A Laceration without foreign body of right elbow, initial encounter: Secondary | ICD-10-CM | POA: Diagnosis not present

## 2021-04-12 ENCOUNTER — Encounter: Payer: Self-pay | Admitting: Allergy & Immunology

## 2021-04-12 ENCOUNTER — Ambulatory Visit: Payer: Medicare PPO | Admitting: Allergy & Immunology

## 2021-04-12 ENCOUNTER — Other Ambulatory Visit: Payer: Self-pay

## 2021-04-12 VITALS — BP 126/72 | HR 70 | Temp 98.2°F | Resp 18 | Ht 60.0 in | Wt 123.0 lb

## 2021-04-12 DIAGNOSIS — R21 Rash and other nonspecific skin eruption: Secondary | ICD-10-CM | POA: Diagnosis not present

## 2021-04-12 DIAGNOSIS — L299 Pruritus, unspecified: Secondary | ICD-10-CM | POA: Diagnosis not present

## 2021-04-12 NOTE — Patient Instructions (Addendum)
1. Rash - Increase the levocetirizine '5mg'$  twice daily.  - Stop the hydroxyzine. - Continue with the triamcinolone mixed with Eucerin up to twice daily.  - Additional information on Dupixent provided: [email or web address] - Tammy will reach out to discuss this.  2. Return in about 3 months (around 07/12/2021).    Please inform us of any Emergency Department visits, hospitalizations, or changes in symptoms. Call us before going to the ED for breathing or allergy symptoms since we might be able to fit you in for a sick visit. Feel free to contact us anytime with any questions, problems, or concerns.  It was a pleasure to see you again today!  Websites that have reliable patient information: 1. American Academy of Asthma, Allergy, and Immunology: www.aaaai.org 2. Food Allergy Research and Education (FARE): foodallergy.org 3. Mothers of Asthmatics: http://www.asthmacommunitynetwork.org 4. American College of Allergy, Asthma, and Immunology: www.acaai.org   COVID-19 Vaccine Information can be found at: ShippingScam.co.uk For questions related to vaccine distribution or appointments, please email [email or web address] or call [phone].   We realize that you might be concerned about having an allergic reaction to the COVID19 vaccines. To help with that concern, WE ARE OFFERING THE COVID19 VACCINES IN OUR OFFICE! Ask the front desk for dates!     "Like" [place] on Facebook and Instagram for our latest updates!      A healthy democracy works best when [employer] participate! Make sure you are registered to vote! If you have moved or changed any of your contact information, you will need to get this updated before voting!  In some cases, you MAY be able to register to vote online: [email or web address]

## 2021-04-12 NOTE — Progress Notes (Signed)
FOLLOW UP  Date of Service/Encounter:  04/12/21   Assessment:   Rash - with negative food and environmental allergy testing today   Biopsy consistent with early eczema versus drug reaction  Plan/Recommendations:   1. Rash - Increase the levocetirizine 66m twice daily.  - Stop the hydroxyzine. - Continue with the triamcinolone mixed with Eucerin up to twice daily.  - Additional information on Dupixent provided: [email or web address] Tammy will reach out to discuss this.  2. Return in about 3 months (around 07/12/2021).    Subjective:   DABEEHA TWISTis a 80y.o. female presenting today for follow up of  Chief Complaint  Patient presents with   Follow-up   Rash    DZEPHYR SAUSEDOhas a history of the following: Patient Active Problem List   Diagnosis Date Noted   Contusion of toe 06/02/2020   Genetic testing 003/[id number]  Monoallelic mutation of CHEK2 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    Family history of cancer    Lipoma of arm 02/06/2013   Personal history of breast cancer 07/31/1996    History obtained from: chart review and patient.  DAkeemais a 80y.o. female presenting for a follow up visit.  She was last seen in July 2022.  At that time, she had testing to the most common foods which was negative.  We restarted her Xyzal 1 tablet twice daily and added hydroxyzine 25 mg at night.  We also started triamcinolone mixed with Eucerin twice daily for 2 weeks, once daily for 2 weeks, and then 3 times weekly thereafter.  We did discuss using Dupixent for long-term control.  Since last visit, she ended up falling her home. She tripped over own two feet.  She was doing laundry and moving laundry around the house when she tripped on the floor.  From the rash perspective, she continues to have the rash and itching.  She says this is more itching than a rash.  She has not been using the levocetirizine once a day.  She never tried increasing to twice a day.  She  does not use the hydroxyzine very often because she also wakes up the next morning feeling a little groggy and out of it.  She has the triamcinolone mixed with Eucerin, which she is only using once a day.  Overall she feels that the rash is slightly better, but still present.  She is open to other ideas.  She has not been back to the dermatologist since I saw her.  As a reminder, she has been on triamcinolone, betamethasone, and fluticasone cream in the past.  Otherwise, there have been no changes to her past medical history, surgical history, family history, or social history.    Review of Systems  Constitutional: Negative.  Negative for fever, malaise/fatigue and weight loss.  HENT: Negative.  Negative for congestion, ear discharge and ear pain.   Eyes:  Negative for pain, discharge and redness.  Respiratory:  Negative for cough, sputum production, shortness of breath and wheezing.   Cardiovascular: Negative.  Negative for chest pain and palpitations.  Gastrointestinal:  Negative for abdominal pain, constipation, diarrhea, heartburn, nausea and vomiting.  Skin:  Positive for itching and rash.  Neurological:  Negative for dizziness and headaches.  Endo/Heme/Allergies:  Negative for environmental allergies. Does not bruise/bleed easily.      Objective:   Blood pressure 126/72, pulse 70, temperature 98.2 F (36.8 C), temperature source Temporal, resp. rate 18, height 5' (1.524 m), weight 123  lb (55.8 kg), SpO2 97 %. Body mass index is 24.02 kg/m.   Physical Exam:  Physical Exam Vitals reviewed.  Constitutional:      Appearance: She is well-developed.  HENT:     Head: Normocephalic and atraumatic.     Right Ear: Tympanic membrane, ear canal and external ear normal.     Left Ear: Tympanic membrane, ear canal and external ear normal.     Nose: No nasal deformity, septal deviation, mucosal edema or rhinorrhea.     Right Turbinates: Not enlarged or swollen.     Left Turbinates: Not  enlarged or swollen.     Right Sinus: No maxillary sinus tenderness or frontal sinus tenderness.     Left Sinus: No maxillary sinus tenderness or frontal sinus tenderness.     Mouth/Throat:     Mouth: Mucous membranes are not pale and not dry.     Pharynx: Uvula midline.  Eyes:     General: Lids are normal. No allergic shiner.       Right eye: No discharge.        Left eye: No discharge.     Conjunctiva/sclera: Conjunctivae normal.     Right eye: Right conjunctiva is not injected. No chemosis.    Left eye: Left conjunctiva is not injected. No chemosis.    Pupils: Pupils are equal, round, and reactive to light.  Cardiovascular:     Rate and Rhythm: Normal rate and regular rhythm.     Heart sounds: Normal heart sounds.  Pulmonary:     Effort: Pulmonary effort is normal. No tachypnea, accessory muscle usage or respiratory distress.     Breath sounds: Normal breath sounds. No wheezing, rhonchi or rales.  Chest:     Chest wall: No tenderness.  Lymphadenopathy:     Cervical: No cervical adenopathy.  Skin:    General: Skin is warm.     Capillary Refill: Capillary refill takes less than 2 seconds.     Coloration: Skin is not pale.     Findings: Rash present. No abrasion, erythema or petechiae. Rash is not papular, urticarial or vesicular.     Comments: Multiple excoriations over her arms and legs.  Open lesions seem to have improved quite a bit and healed over.  Neurological:     Mental Status: She is alert.  Psychiatric:        Behavior: Behavior is cooperative.     Diagnostic studies: none    Salvatore Marvel, MD  [facility]

## 2021-04-14 ENCOUNTER — Encounter: Payer: Self-pay | Admitting: Allergy & Immunology

## 2021-04-19 DIAGNOSIS — S51811D Laceration without foreign body of right forearm, subsequent encounter: Secondary | ICD-10-CM | POA: Diagnosis not present

## 2021-04-20 ENCOUNTER — Telehealth: Payer: Self-pay | Admitting: *Deleted

## 2021-04-20 NOTE — Telephone Encounter (Signed)
-----   Message from [name], MD sent at 04/14/2021  5:21 AM EDT ----- Patient interested in Elaine Carter.  Can you call her?  She is delightful!

## 2021-04-20 NOTE — Telephone Encounter (Signed)
L/m for patient to contact me to discuss Dupixent

## 2021-05-03 NOTE — Telephone Encounter (Signed)
L/m for patient to contact me again ?

## 2021-05-09 NOTE — Telephone Encounter (Signed)
Spoke to patient and she does want to try [facility]. Will mail consent for for Dupixent [facility] P[facility] and explained process of PAP. Patient does want to get injections in clinic

## 2021-05-31 DIAGNOSIS — H04123 Dry eye syndrome of bilateral lacrimal glands: Secondary | ICD-10-CM | POA: Diagnosis not present

## 2021-05-31 DIAGNOSIS — H35032 Hypertensive retinopathy, left eye: Secondary | ICD-10-CM | POA: Diagnosis not present

## 2021-05-31 DIAGNOSIS — H5213 Myopia, bilateral: Secondary | ICD-10-CM | POA: Diagnosis not present

## 2021-05-31 DIAGNOSIS — H2511 Age-related nuclear cataract, right eye: Secondary | ICD-10-CM | POA: Diagnosis not present

## 2021-06-04 DIAGNOSIS — Z23 Encounter for immunization: Secondary | ICD-10-CM | POA: Diagnosis not present

## 2021-06-07 ENCOUNTER — Other Ambulatory Visit: Payer: Self-pay | Admitting: Hematology and Oncology

## 2021-06-07 ENCOUNTER — Other Ambulatory Visit: Payer: Self-pay | Admitting: Family Medicine

## 2021-06-07 DIAGNOSIS — Z1231 Encounter for screening mammogram for malignant neoplasm of breast: Secondary | ICD-10-CM

## 2021-07-05 ENCOUNTER — Other Ambulatory Visit: Payer: Self-pay | Admitting: *Deleted

## 2021-07-05 MED ORDER — DUPIXENT 300 MG/2ML ~~LOC~~ SOSY: 600.0000 mg | PREFILLED_SYRINGE | Freq: Once | SUBCUTANEOUS | 11 refills | Status: AC

## 2021-07-05 MED ORDER — DUPIXENT 300 MG/2ML ~~LOC~~ SOSY: 600.0000 mg | PREFILLED_SYRINGE | Freq: Once | SUBCUTANEOUS | 0 refills | Status: DC

## 2021-07-12 ENCOUNTER — Other Ambulatory Visit: Payer: Self-pay

## 2021-07-12 ENCOUNTER — Other Ambulatory Visit: Payer: Self-pay | Admitting: *Deleted

## 2021-07-12 ENCOUNTER — Ambulatory Visit
Admission: RE | Admit: 2021-07-12 | Discharge: 2021-07-12 | Disposition: A | Discharge status: Home / Self Care | Payer: Medicare PPO | Source: Ambulatory Visit | Attending: Hematology and Oncology | Admitting: Hematology and Oncology

## 2021-07-12 DIAGNOSIS — Z1231 Encounter for screening mammogram for malignant neoplasm of breast: Secondary | ICD-10-CM

## 2021-07-12 MED ORDER — DUPIXENT 300 MG/2ML ~~LOC~~ SOSY: 600.0000 mg | PREFILLED_SYRINGE | Freq: Once | SUBCUTANEOUS | 11 refills | Status: DC

## 2021-07-12 MED ORDER — DUPIXENT 300 MG/2ML ~~LOC~~ SOSY: 600.0000 mg | PREFILLED_SYRINGE | Freq: Once | SUBCUTANEOUS | 11 refills | Status: AC

## 2021-07-28 ENCOUNTER — Other Ambulatory Visit: Payer: Self-pay | Admitting: *Deleted

## 2021-07-28 MED ORDER — DUPIXENT 300 MG/2ML ~~LOC~~ SOSY: 600.0000 mg | PREFILLED_SYRINGE | Freq: Once | SUBCUTANEOUS | 0 refills | Status: AC

## 2021-08-11 ENCOUNTER — Telehealth: Payer: Self-pay | Admitting: *Deleted

## 2021-08-11 NOTE — Telephone Encounter (Signed)
Called patient to advised approval for PAP thru [facility] and a nurse will be reaching out to her to discuss same and schedule shipment of drug that she will need to bring to office for initial injs

## 2021-08-19 ENCOUNTER — Other Ambulatory Visit: Payer: Self-pay

## 2021-08-19 ENCOUNTER — Ambulatory Visit (INDEPENDENT_AMBULATORY_CARE_PROVIDER_SITE_OTHER): Payer: Medicare PPO

## 2021-08-19 DIAGNOSIS — L209 Atopic dermatitis, unspecified: Secondary | ICD-10-CM | POA: Diagnosis not present

## 2021-08-19 MED ORDER — DUPILUMAB 300 MG/2ML ~~LOC~~ SOSY
600.0000 mg | PREFILLED_SYRINGE | Freq: Once | SUBCUTANEOUS | Status: AC
  Administered 2021-08-19: 600 mg via SUBCUTANEOUS

## 2021-08-19 NOTE — Progress Notes (Signed)
Immunotherapy   Patient Details  Name: Elaine Carter MRN: [id number] Date of Birth: January 09, 1941  08/19/2021  Elaine Carter received loading dose of Dupixent today (600 mg dose). Patient waited 30 minutes in office without any issues. Consent signed and patient instructions given.   Guy Franco 08/19/2021, 2:57 PM

## 2021-09-02 ENCOUNTER — Other Ambulatory Visit: Payer: Self-pay

## 2021-09-02 ENCOUNTER — Ambulatory Visit (INDEPENDENT_AMBULATORY_CARE_PROVIDER_SITE_OTHER): Payer: Medicare PPO | Admitting: *Deleted

## 2021-09-02 DIAGNOSIS — L209 Atopic dermatitis, unspecified: Secondary | ICD-10-CM

## 2021-09-02 MED ORDER — DUPILUMAB 300 MG/2ML ~~LOC~~ SOSY
300.0000 mg | PREFILLED_SYRINGE | SUBCUTANEOUS | Status: AC
  Administered 2021-09-02 – 2024-09-03 (×71): 300 mg via SUBCUTANEOUS

## 2021-09-16 ENCOUNTER — Other Ambulatory Visit: Payer: Self-pay

## 2021-09-16 ENCOUNTER — Ambulatory Visit (INDEPENDENT_AMBULATORY_CARE_PROVIDER_SITE_OTHER): Payer: Medicare PPO

## 2021-09-16 DIAGNOSIS — L209 Atopic dermatitis, unspecified: Secondary | ICD-10-CM

## 2021-09-20 DIAGNOSIS — D692 Other nonthrombocytopenic purpura: Secondary | ICD-10-CM | POA: Diagnosis not present

## 2021-09-20 DIAGNOSIS — M1990 Unspecified osteoarthritis, unspecified site: Secondary | ICD-10-CM | POA: Diagnosis not present

## 2021-09-20 DIAGNOSIS — R293 Abnormal posture: Secondary | ICD-10-CM | POA: Diagnosis not present

## 2021-09-20 DIAGNOSIS — I10 Essential (primary) hypertension: Secondary | ICD-10-CM | POA: Diagnosis not present

## 2021-09-20 DIAGNOSIS — E559 Vitamin D deficiency, unspecified: Secondary | ICD-10-CM | POA: Diagnosis not present

## 2021-09-20 DIAGNOSIS — R21 Rash and other nonspecific skin eruption: Secondary | ICD-10-CM | POA: Diagnosis not present

## 2021-09-20 DIAGNOSIS — K219 Gastro-esophageal reflux disease without esophagitis: Secondary | ICD-10-CM | POA: Diagnosis not present

## 2021-09-20 DIAGNOSIS — E7800 Pure hypercholesterolemia, unspecified: Secondary | ICD-10-CM | POA: Diagnosis not present

## 2021-09-20 DIAGNOSIS — H269 Unspecified cataract: Secondary | ICD-10-CM | POA: Diagnosis not present

## 2021-09-27 ENCOUNTER — Other Ambulatory Visit: Payer: Self-pay

## 2021-09-27 ENCOUNTER — Ambulatory Visit (INDEPENDENT_AMBULATORY_CARE_PROVIDER_SITE_OTHER): Payer: Medicare PPO | Admitting: Allergy & Immunology

## 2021-09-27 ENCOUNTER — Encounter: Payer: Self-pay | Admitting: Allergy & Immunology

## 2021-09-27 VITALS — BP 130/72 | HR 77 | Temp 97.8°F | Resp 18 | Ht 60.0 in | Wt 120.1 lb

## 2021-09-27 DIAGNOSIS — L2089 Other atopic dermatitis: Secondary | ICD-10-CM

## 2021-09-27 DIAGNOSIS — R21 Rash and other nonspecific skin eruption: Secondary | ICD-10-CM | POA: Diagnosis not present

## 2021-09-27 DIAGNOSIS — L281 Prurigo nodularis: Secondary | ICD-10-CM

## 2021-09-27 NOTE — Progress Notes (Signed)
FOLLOW UP  Date of Service/Encounter:  09/27/21   Assessment:   Atopic dermatitis  - much better controlled on Dupixent   Prurigo nodularis - hopefully will improve over the long-term with Dupixent  Plan/Recommendations:   1. Rash - Continue the levocetirizine 5mg  1-2 times per day.  - Continue with the triamcinolone mixed with Eucerin up to twice daily as needed. - Continue with clobetasol twice daily as needed on the lesions on your legs.  - Continue with Dupixent every two weeks.  - I think the lesions on your legs are prurigo nodularis (I sent you information on it).  - This is treated with Dupixent as well.   2. Return in about 6 months (around 03/27/2022).    Subjective:   Elaine Carter is a 81 y.o. female presenting today for follow up of  Chief Complaint  Patient presents with   Rash    Elaine Carter has a history of the following: Patient Active Problem List   Diagnosis Date Noted   Flexural atopic dermatitis 09/27/2021   Prurigo nodularis 09/27/2021   Contusion of toe 06/02/2020   Genetic testing 09/01/2016   Monoallelic mutation of CHEK2 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    Family history of cancer    Lipoma of arm 02/06/2013   Personal history of breast cancer 07/31/1996    History obtained from: chart review and patient.  Elaine Carter is a 81 y.o. female presenting for a follow up visit.  She was last seen in September 2022.  At that time, we increased her levocetirizine to 5 mg twice daily.  We stopped her hydroxyzine.  We continue with triamcinolone twice daily.  We gave her extra information so she could read more about Dupixent.  In the interim, she did decide to start Dupixent.  She received her first injection August 19, 2021.  Since last visit, she has mostly done well. She feels that the rash on her back and abdomen has all improved quite a bit. The bumps have resolved and improved over time. Her legs are still clearer all over.   She talked to Dr.  August 21, 2021 and she was fine with starting the Dupixent.  So she went ahead and decided to start it.  She has had no reactions to her injections.  Itching has resolved quite a bit.  She is very happy with how she is feeling.  She has lesions on her legs that still remain despite the Dupixent. She tells me that she had some shots in the lesions themselves in April 2022.  She is unsure what was injected into the lesions, but less likely steroids.  She never got a firm diagnosis of what these lesions were.  She has been doing the Xyzal, but only once daily.  Otherwise, there have been no changes to her past medical history, surgical history, family history, or social history.    Review of Systems  Constitutional: Negative.  Negative for chills, fever, malaise/fatigue and weight loss.  HENT: Negative.  Negative for congestion, ear discharge and ear pain.   Eyes:  Negative for pain, discharge and redness.  Respiratory:  Negative for cough, sputum production, shortness of breath and wheezing.   Cardiovascular: Negative.  Negative for chest pain and palpitations.  Gastrointestinal:  Negative for abdominal pain, constipation, diarrhea, heartburn, nausea and vomiting.  Skin:  Positive for rash. Negative for itching.  Neurological:  Negative for dizziness and headaches.  Endo/Heme/Allergies:  Negative for environmental allergies. Does not bruise/bleed easily.  Objective:   Blood pressure 130/72, pulse 77, temperature 97.8 F (36.6 C), resp. rate 18, height 5' (1.524 m), weight 120 lb 2 oz (54.5 kg), SpO2 99 %. Body mass index is 23.46 kg/m.    Physical Exam Vitals reviewed.  Constitutional:      Appearance: She is well-developed.  HENT:     Head: Normocephalic and atraumatic.     Right Ear: Tympanic membrane, ear canal and external ear normal.     Left Ear: Tympanic membrane, ear canal and external ear normal.     Nose: No nasal deformity, septal deviation, mucosal edema or  rhinorrhea.     Right Turbinates: Not enlarged or swollen.     Left Turbinates: Not enlarged or swollen.     Right Sinus: No maxillary sinus tenderness or frontal sinus tenderness.     Left Sinus: No maxillary sinus tenderness or frontal sinus tenderness.     Mouth/Throat:     Mouth: Mucous membranes are not pale and not dry.     Pharynx: Uvula midline.  Eyes:     General: Lids are normal. No allergic shiner.       Right eye: No discharge.        Left eye: No discharge.     Conjunctiva/sclera: Conjunctivae normal.     Right eye: Right conjunctiva is not injected. No chemosis.    Left eye: Left conjunctiva is not injected. No chemosis.    Pupils: Pupils are equal, round, and reactive to light.  Cardiovascular:     Rate and Rhythm: Normal rate and regular rhythm.     Heart sounds: Normal heart sounds.  Pulmonary:     Effort: Pulmonary effort is normal. No tachypnea, accessory muscle usage or respiratory distress.     Breath sounds: Normal breath sounds. No wheezing, rhonchi or rales.  Chest:     Chest wall: No tenderness.  Lymphadenopathy:     Cervical: No cervical adenopathy.  Skin:    General: Skin is warm.     Capillary Refill: Capillary refill takes less than 2 seconds.     Coloration: Skin is not pale.     Findings: Rash present. No abrasion, erythema or petechiae. Rash is not papular, urticarial or vesicular.     Comments: Skin looks much clearer overall.  She does have some bruising on her bilateral arms.  She has lesions on her leg as well as her thigh that look consistent with prurigo nodularis.  Neurological:     Mental Status: She is alert.  Psychiatric:        Behavior: Behavior is cooperative.     Diagnostic studies:  none      Salvatore Marvel, MD  [facility]

## 2021-09-27 NOTE — Patient Instructions (Addendum)
1. Rash - Continue the levocetirizine 5mg  1-2 times per day.  - Continue with the triamcinolone mixed with Eucerin up to twice daily as needed. - Continue with clobetasol twice daily as needed on the lesions on your legs.  - Continue with Dupixent every two weeks.  - I think the lesions on your legs are prurigo nodularis (I sent you information on it).  - This is treated with Dupixent as well.   2. Return in about 6 months (around 03/27/2022).    Please inform us of any Emergency Department visits, hospitalizations, or changes in symptoms. Call us before going to the ED for breathing or allergy symptoms since we might be able to fit you in for a sick visit. Feel free to contact us anytime with any questions, problems, or concerns.  It was a pleasure to see you again today!  Websites that have reliable patient information: 1. American Academy of Asthma, Allergy, and Immunology: www.aaaai.org 2. Food Allergy Research and Education (FARE): foodallergy.org 3. Mothers of Asthmatics: http://www.asthmacommunitynetwork.org 4. American College of Allergy, Asthma, and Immunology: www.acaai.org   COVID-19 Vaccine Information can be found at: ShippingScam.co.uk For questions related to vaccine distribution or appointments, please email [email or web address] or call [phone].   We realize that you might be concerned about having an allergic reaction to the COVID19 vaccines. To help with that concern, WE ARE OFFERING THE COVID19 VACCINES IN OUR OFFICE! Ask the front desk for dates!     Like [place] on [employer] [place] and Instagram for our latest updates!      A healthy democracy works best when [employer] participate! Make sure you are registered to vote! If you have moved or changed any of your contact information, you will need to get this updated before voting!  In some cases, you MAY be able to register to vote online:  [email or web address]

## 2021-09-30 ENCOUNTER — Ambulatory Visit: Payer: Medicare PPO

## 2021-09-30 ENCOUNTER — Ambulatory Visit (INDEPENDENT_AMBULATORY_CARE_PROVIDER_SITE_OTHER): Payer: Medicare PPO

## 2021-09-30 ENCOUNTER — Other Ambulatory Visit: Payer: Self-pay

## 2021-09-30 DIAGNOSIS — L209 Atopic dermatitis, unspecified: Secondary | ICD-10-CM | POA: Diagnosis not present

## 2021-10-04 DIAGNOSIS — R293 Abnormal posture: Secondary | ICD-10-CM | POA: Diagnosis not present

## 2021-10-14 ENCOUNTER — Ambulatory Visit (INDEPENDENT_AMBULATORY_CARE_PROVIDER_SITE_OTHER): Payer: Medicare PPO

## 2021-10-14 ENCOUNTER — Other Ambulatory Visit: Payer: Self-pay

## 2021-10-14 DIAGNOSIS — L209 Atopic dermatitis, unspecified: Secondary | ICD-10-CM

## 2021-10-18 DIAGNOSIS — R293 Abnormal posture: Secondary | ICD-10-CM | POA: Diagnosis not present

## 2021-10-26 DIAGNOSIS — H25011 Cortical age-related cataract, right eye: Secondary | ICD-10-CM | POA: Diagnosis not present

## 2021-10-26 DIAGNOSIS — H02054 Trichiasis without entropian left upper eyelid: Secondary | ICD-10-CM | POA: Diagnosis not present

## 2021-10-26 DIAGNOSIS — H2511 Age-related nuclear cataract, right eye: Secondary | ICD-10-CM | POA: Diagnosis not present

## 2021-10-28 ENCOUNTER — Ambulatory Visit (INDEPENDENT_AMBULATORY_CARE_PROVIDER_SITE_OTHER): Payer: Medicare PPO

## 2021-10-28 ENCOUNTER — Ambulatory Visit: Payer: Medicare PPO

## 2021-10-28 DIAGNOSIS — L209 Atopic dermatitis, unspecified: Secondary | ICD-10-CM | POA: Diagnosis not present

## 2021-11-01 DIAGNOSIS — R293 Abnormal posture: Secondary | ICD-10-CM | POA: Diagnosis not present

## 2021-11-10 ENCOUNTER — Telehealth: Payer: Self-pay | Admitting: Hematology and Oncology

## 2021-11-10 NOTE — Telephone Encounter (Signed)
Per provider called pt and spoke to pt.  Pt confirmed new appointment  ?

## 2021-11-11 ENCOUNTER — Ambulatory Visit (INDEPENDENT_AMBULATORY_CARE_PROVIDER_SITE_OTHER): Payer: Medicare PPO

## 2021-11-11 ENCOUNTER — Ambulatory Visit: Payer: Self-pay

## 2021-11-11 ENCOUNTER — Telehealth: Payer: Self-pay | Admitting: Podiatry

## 2021-11-11 DIAGNOSIS — L209 Atopic dermatitis, unspecified: Secondary | ICD-10-CM | POA: Diagnosis not present

## 2021-11-11 DIAGNOSIS — J309 Allergic rhinitis, unspecified: Secondary | ICD-10-CM | POA: Insufficient documentation

## 2021-11-11 NOTE — Telephone Encounter (Signed)
Patient called looking for Budin toe straightner, they said we do not have them at the front desk , is this something we special order for patients?

## 2021-11-14 NOTE — Telephone Encounter (Signed)
Called patient let her know to stop into the office and we will get her one.  She said she has family in town and it will be Tuesday or Wednesday.

## 2021-11-14 NOTE — Telephone Encounter (Signed)
If you google it we have them back here.  Please notify patient she can come in and we will give her one ?

## 2021-11-17 DIAGNOSIS — R293 Abnormal posture: Secondary | ICD-10-CM | POA: Diagnosis not present

## 2021-11-24 DIAGNOSIS — H25011 Cortical age-related cataract, right eye: Secondary | ICD-10-CM | POA: Diagnosis not present

## 2021-11-24 DIAGNOSIS — H25811 Combined forms of age-related cataract, right eye: Secondary | ICD-10-CM | POA: Diagnosis not present

## 2021-11-24 DIAGNOSIS — H21561 Pupillary abnormality, right eye: Secondary | ICD-10-CM | POA: Diagnosis not present

## 2021-11-24 DIAGNOSIS — H2511 Age-related nuclear cataract, right eye: Secondary | ICD-10-CM | POA: Diagnosis not present

## 2021-11-25 ENCOUNTER — Ambulatory Visit (INDEPENDENT_AMBULATORY_CARE_PROVIDER_SITE_OTHER): Payer: Medicare PPO

## 2021-11-25 DIAGNOSIS — L209 Atopic dermatitis, unspecified: Secondary | ICD-10-CM

## 2021-12-02 IMAGING — MR MR BREAST BILAT WO/W CM
8 of 12 series · 30 of 48 positions shown · IV contrast (gadavist)
Comparison: BILATERAL Breast MRI 11/07/2017 and earlier. Multiple
prior mammograms, most recently 05/26/2019.

CLINICAL DATA: 79-year-old with a personal history of a malignant
LEFT lumpectomy in 0330 and with a known pathogenic mutation in the
[id number] gene, placing her in the high risk category for breast cancer.
High risk screening evaluation.

LABS:  Not applicable.
EXAM:
BILATERAL BREAST MRI WITH AND WITHOUT CONTRAST
TECHNIQUE: Multiplanar, multisequence MR images of both breasts were obtained
prior to and following the intravenous administration of 6 ml of
Gadavist.

[Series 2: t2_tirm_tra ipat (a-p) · axial · 3.0mm · 0.64mm/px · 1 of 60 slices shown]
[im 1/60]
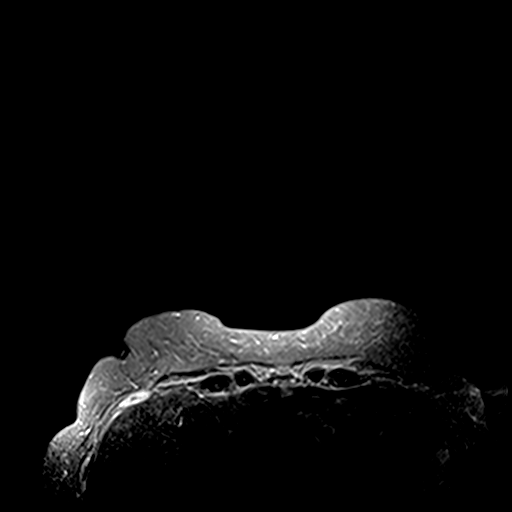

[Series 3: fl3d pre-cm no · axial · non-contrast · 0.9mm · 0.86mm/px · z∈[-53,+105]mm · 5 of 176 slices shown]
[im 1/176]
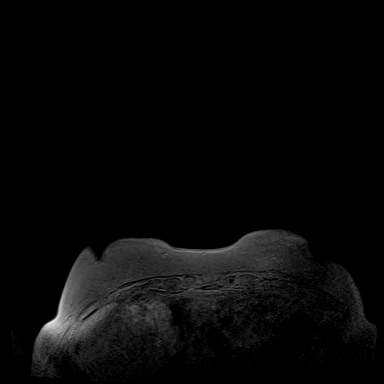
[im 44/176]
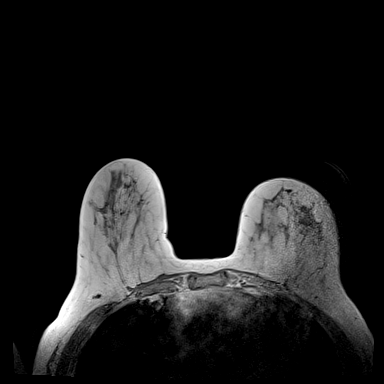
[im 88/176]
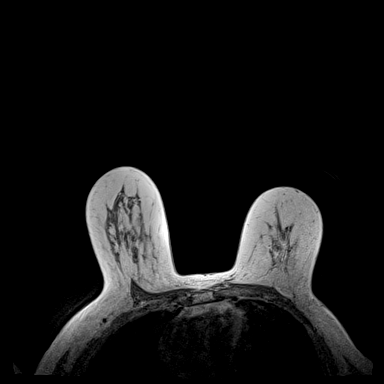
[im 132/176]
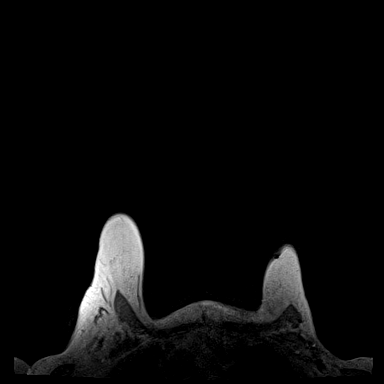
[im 176/176]
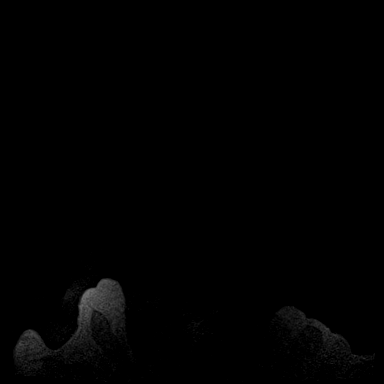

[Series 4: fl3d pre-cm · axial · non-contrast · 0.9mm · 0.79mm/px · z∈[-53,+105]mm · 5 of 176 slices shown]
[im 1/176]
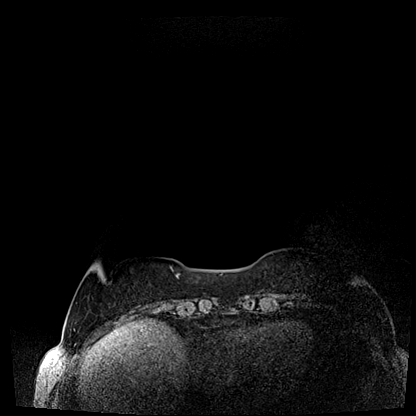
[im 44/176]
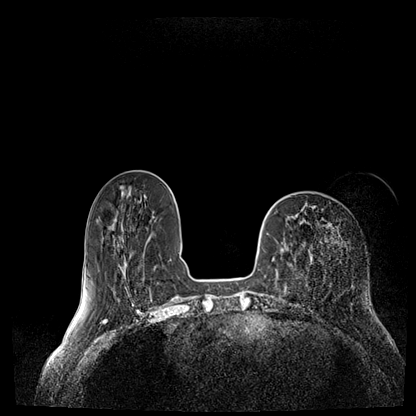
[im 88/176]
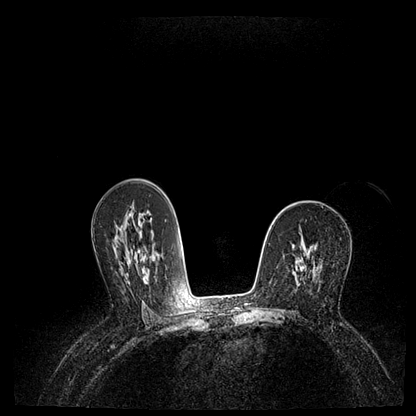
[im 132/176]
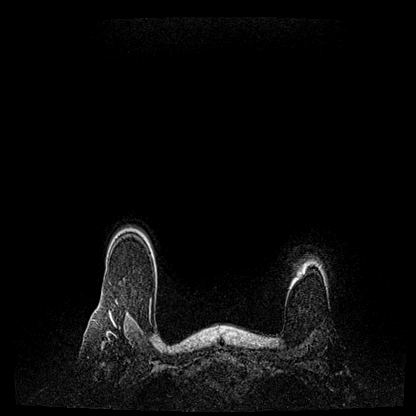
[im 176/176]
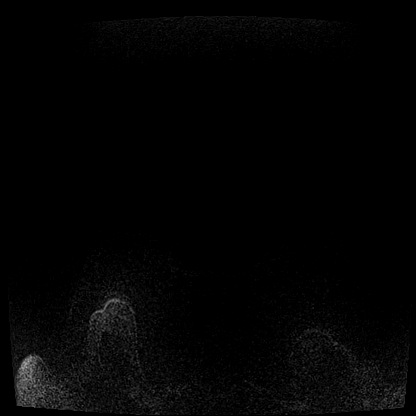

[Series 5: fl3d post-cm 20 · axial · 0.9mm · 0.79mm/px · z∈[-53,+105]mm · 5 of 176 slices shown (1 of 3)]
[im 1/176]
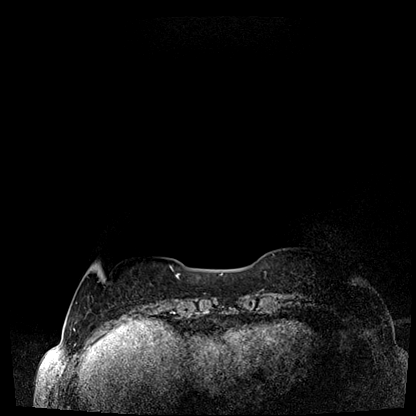
[im 44/176]
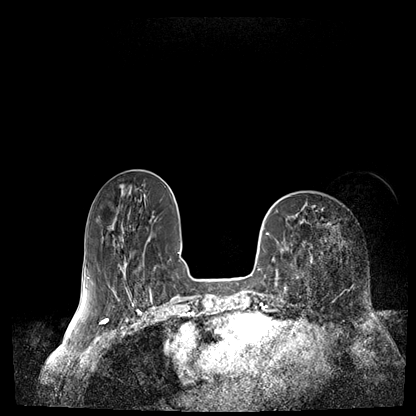
[im 88/176]
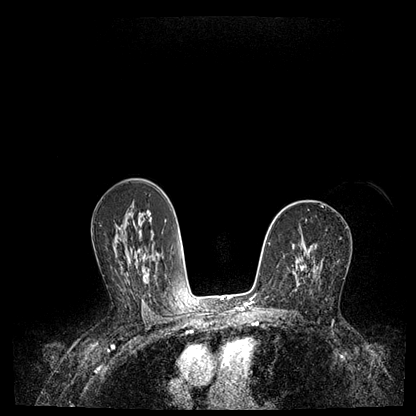
[im 132/176]
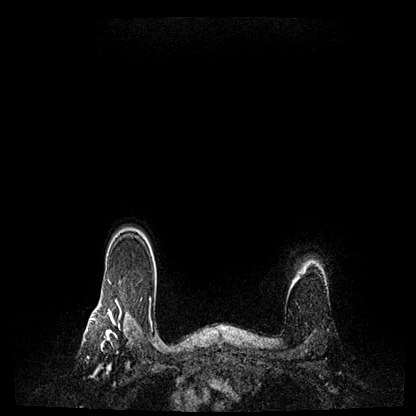
[im 176/176]
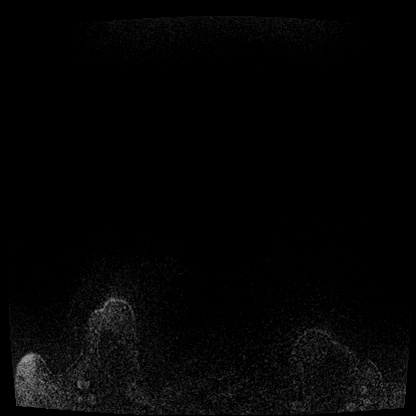

[Series 6: fl3d post-cm 20 · axial · 0.9mm · 0.79mm/px · z∈[-53,+105]mm · 5 of 176 slices shown (2 of 3)]
[im 1/176]
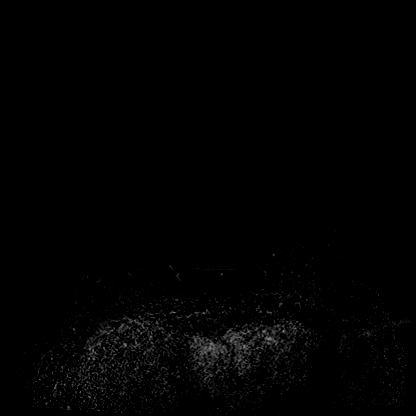
[im 44/176]
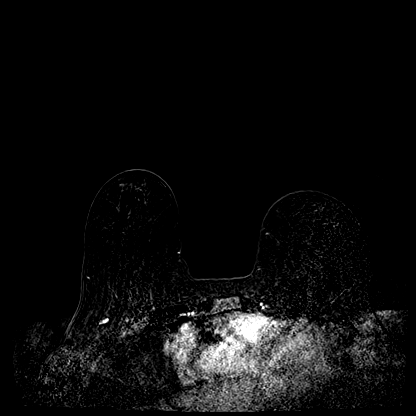
[im 88/176]
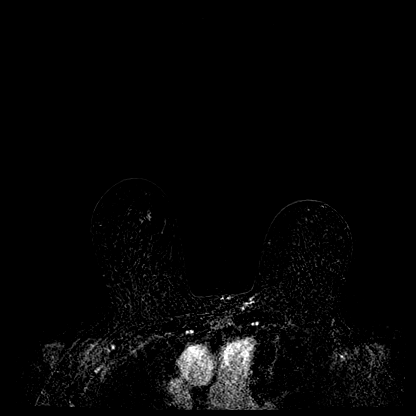
[im 132/176]
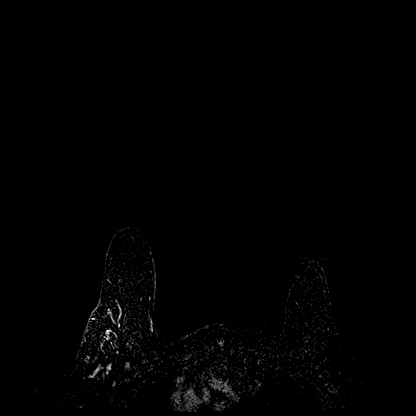
[im 176/176]
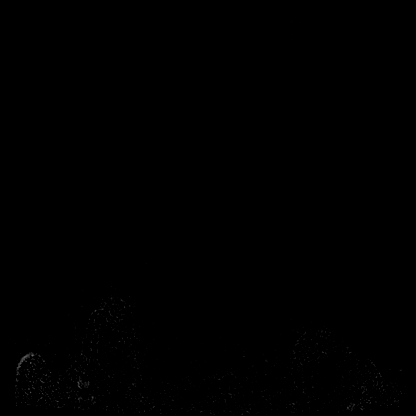

[Series 7: fl3d post-cm 20 · axial · 158.4mm · 0.79mm/px · 1 of 1 slices shown (3 of 3)]
[im 1/1]
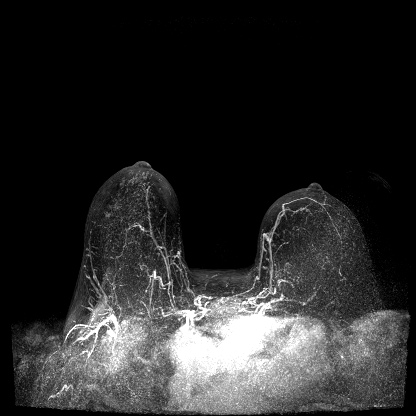

[Series 8: fl3d post-cm 3min · axial · 0.9mm · 0.79mm/px · z∈[-53,+105]mm · 6 of 176 slices shown]
[im 1/176]
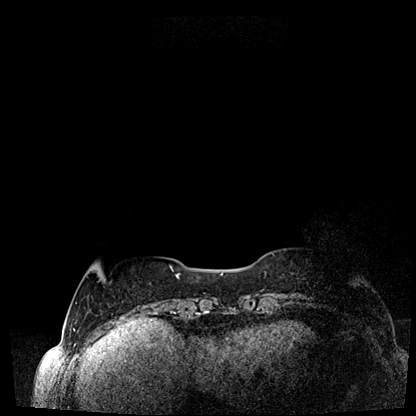
[im 36/176]
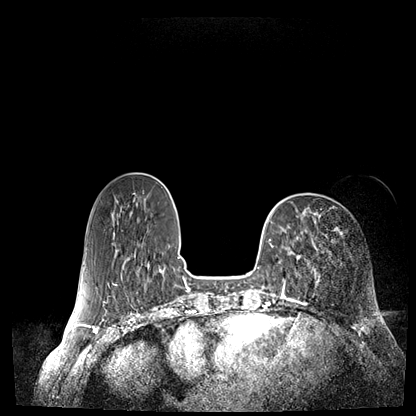
[im 71/176]
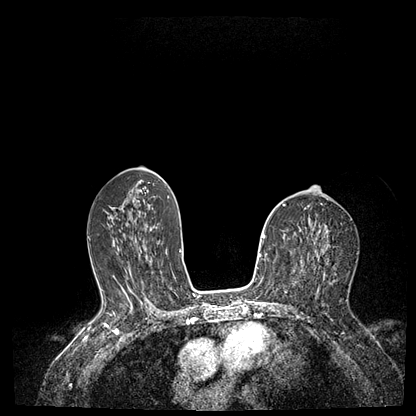
[im 106/176]
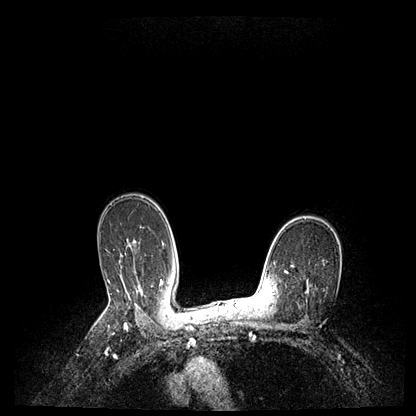
[im 141/176]
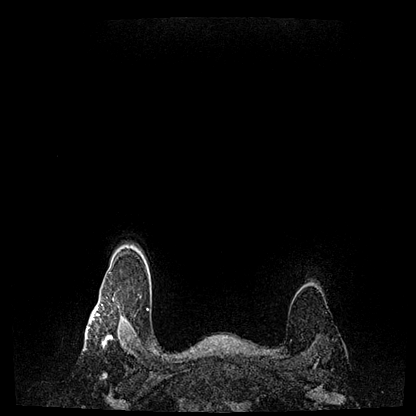
[im 176/176]
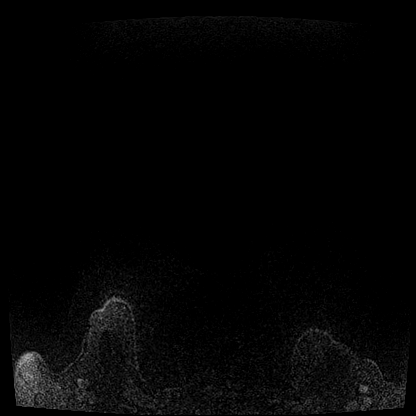

[Series 9: fl3d post-cm 3min_sub · axial · 0.9mm · 0.79mm/px · z∈[-53,-21]mm · 2 of 176 slices shown]
[im 1/176]
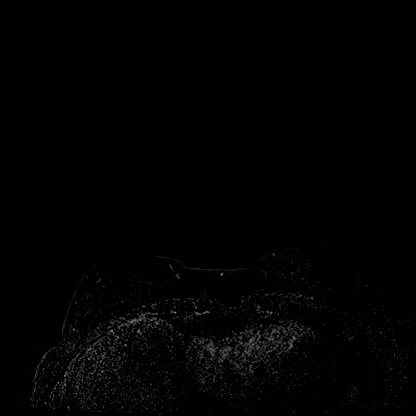
[im 36/176]
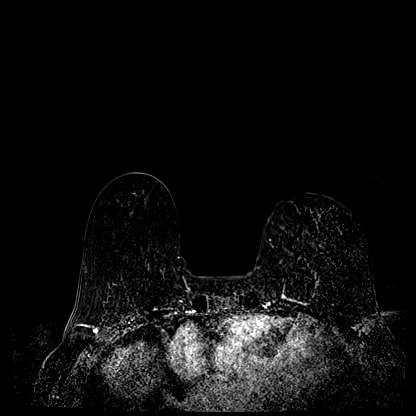

[30 of 48 positions shown; findings below may reference images not displayed]

Three-dimensional MR images were rendered by post-processing of the
original MR data on an independent workstation. The
three-dimensional MR images were interpreted, and findings are
reported in the following complete MRI report for this study. Three
dimensional images were evaluated using the DynaCad thin client on
the independent workstation used by the radiologist for image
interpretation.
FINDINGS: Breast composition: b. Scattered fibroglandular tissue.

Background parenchymal enhancement: Mild.

Right breast: No suspicious mass or abnormal enhancement.

Left breast: No suspicious mass or abnormal enhancement. Mild
architectural distortion related to the lumpectomy scar in the UPPER
OUTER breast.

Lymph nodes: No pathologic lymphadenopathy.

Ancillary findings:  None.
IMPRESSION: No MRI evidence of malignancy involving either breast.

RECOMMENDATION:
Annual BILATERAL screening mammography which is due in late [REDACTED]
or May 2020.

Annual high risk screening MRI is also recommended, as long as the
patient has an expected life span of 10+ years.

BI-RADS CATEGORY  2: Benign.

## 2021-12-09 ENCOUNTER — Ambulatory Visit (INDEPENDENT_AMBULATORY_CARE_PROVIDER_SITE_OTHER): Payer: Medicare PPO

## 2021-12-09 DIAGNOSIS — L209 Atopic dermatitis, unspecified: Secondary | ICD-10-CM | POA: Diagnosis not present

## 2021-12-27 ENCOUNTER — Ambulatory Visit: Payer: Medicare PPO

## 2022-01-06 ENCOUNTER — Ambulatory Visit (INDEPENDENT_AMBULATORY_CARE_PROVIDER_SITE_OTHER): Payer: Medicare PPO

## 2022-01-06 DIAGNOSIS — L209 Atopic dermatitis, unspecified: Secondary | ICD-10-CM | POA: Diagnosis not present

## 2022-01-10 DIAGNOSIS — J069 Acute upper respiratory infection, unspecified: Secondary | ICD-10-CM | POA: Diagnosis not present

## 2022-01-17 ENCOUNTER — Telehealth: Payer: Self-pay | Admitting: Hematology and Oncology

## 2022-01-17 NOTE — Telephone Encounter (Signed)
Rescheduled appointment per room/resource. Patient is aware of the changes made to her upcoming appointment. 

## 2022-01-20 ENCOUNTER — Ambulatory Visit (INDEPENDENT_AMBULATORY_CARE_PROVIDER_SITE_OTHER): Payer: Medicare PPO

## 2022-01-20 DIAGNOSIS — L209 Atopic dermatitis, unspecified: Secondary | ICD-10-CM | POA: Diagnosis not present

## 2022-02-07 ENCOUNTER — Ambulatory Visit (INDEPENDENT_AMBULATORY_CARE_PROVIDER_SITE_OTHER): Payer: Medicare PPO

## 2022-02-07 DIAGNOSIS — L209 Atopic dermatitis, unspecified: Secondary | ICD-10-CM | POA: Diagnosis not present

## 2022-02-08 ENCOUNTER — Ambulatory Visit: Payer: Medicare PPO | Admitting: Hematology and Oncology

## 2022-02-09 ENCOUNTER — Ambulatory Visit (HOSPITAL_COMMUNITY)
Admission: RE | Admit: 2022-02-09 | Discharge: 2022-02-09 | Disposition: A | Discharge status: Home / Self Care | Payer: Medicare PPO | Source: Ambulatory Visit | Attending: Hematology and Oncology | Admitting: Hematology and Oncology

## 2022-02-09 ENCOUNTER — Telehealth: Payer: Self-pay | Admitting: Hematology and Oncology

## 2022-02-09 DIAGNOSIS — Z1502 Genetic susceptibility to malignant neoplasm of ovary: Secondary | ICD-10-CM | POA: Insufficient documentation

## 2022-02-09 DIAGNOSIS — Z1501 Genetic susceptibility to malignant neoplasm of breast: Secondary | ICD-10-CM | POA: Diagnosis not present

## 2022-02-09 DIAGNOSIS — Z1509 Genetic susceptibility to other malignant neoplasm: Secondary | ICD-10-CM | POA: Insufficient documentation

## 2022-02-09 DIAGNOSIS — Z1589 Genetic susceptibility to other disease: Secondary | ICD-10-CM | POA: Insufficient documentation

## 2022-02-09 DIAGNOSIS — R928 Other abnormal and inconclusive findings on diagnostic imaging of breast: Secondary | ICD-10-CM | POA: Diagnosis not present

## 2022-02-09 MED ORDER — GADOBUTROL 1 MMOL/ML IV SOLN
5.0000 mL | Freq: Once | INTRAVENOUS | Status: AC | PRN
  Administered 2022-02-09: 5 mL via INTRAVENOUS

## 2022-02-09 NOTE — Telephone Encounter (Signed)
I left a voice message for the patient that the breast MRI was normal

## 2022-02-15 ENCOUNTER — Ambulatory Visit: Payer: Medicare PPO | Admitting: Hematology and Oncology

## 2022-02-16 ENCOUNTER — Ambulatory Visit: Payer: Medicare PPO | Admitting: Hematology and Oncology

## 2022-02-21 ENCOUNTER — Ambulatory Visit (INDEPENDENT_AMBULATORY_CARE_PROVIDER_SITE_OTHER): Payer: Medicare PPO

## 2022-02-21 ENCOUNTER — Inpatient Hospital Stay: Payer: Medicare PPO | Attending: Hematology and Oncology | Admitting: Hematology and Oncology

## 2022-02-21 DIAGNOSIS — L209 Atopic dermatitis, unspecified: Secondary | ICD-10-CM | POA: Diagnosis not present

## 2022-02-21 NOTE — Assessment & Plan Note (Deleted)
Based on NCCN guidelines, this is a pathogenic mutation that has been associated with risk of not only breast cancer but also colon, thyroid and kidney cancers  Breast cancer surveillance: Breast MRI7/13/2023:No evidence of malignancy breast density category B Mammogram12/14/2022: No evidence of malignancy within either breast: Breast density categoryC  We will plan to do breast MRIs every other year. Next MRI will be done in July 2025.    Other cancer surveillance: Apart of breast cancer, there are no definite surveillance approaches to kidney cancer. For colon cancer she will need a colonoscopyevery 5 years. For thyroid cancer,she will need manual thyroid examinations for any lumps or nodules.  Return to clinic in 1 year for follow-up.

## 2022-02-27 ENCOUNTER — Telehealth: Payer: Self-pay

## 2022-02-27 NOTE — Telephone Encounter (Signed)
Patient called in - DOB verified - requested information provider sent her via email regarding the 2 itchy spots she had.  Advised patient per provider - Prurigo Nodularis  Patient verbalized understanding, no further questions.

## 2022-03-01 NOTE — Progress Notes (Signed)
 Patient Care Team: Ehinger, Robert, MD as PCP - General (Family Medicine)  DIAGNOSIS:  Encounter Diagnosis  Name Primary?   Monoallelic mutation of CHEK2 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     CHIEF COMPLIANT: Follow-up Chek 2 mutation who is currently on surveillance   INTERVAL HISTORY: Elaine Carter is a 81 y.o. with above-mentioned history of Chek 2 mutation who is currently on surveillance. She presents to the clinic today for a follow-up.  She denies any lumps or nodules in the breast.  Slight tenderness in the left breast.  She does have osteoarthritis in her left arm and left leg.  She has noticed an increase size of the bunion in the left leg.   ALLERGIES:  is allergic to codeine, sulfa antibiotics, tape, and dover two-sided adhesive strap [attends briefs small].  MEDICATIONS:  Current Outpatient Medications  Medication Sig Dispense Refill   amLODipine (NORVASC) 5 MG tablet Take 5 mg by mouth daily.     aspirin EC 81 MG tablet Take 81 mg by mouth daily.     calcium citrate-vitamin D (CITRACAL+D) 315-200 MG-UNIT tablet Take 1 tablet by mouth daily.     chlorpheniramine (CHLOR-TRIMETON) 4 MG tablet Take 4 mg by mouth 2 (two) times daily as needed for allergies.     cholecalciferol (VITAMIN D) 1000 UNITS tablet Take 1,000 Units by mouth daily. 2000u     clobetasol (TEMOVATE) 0.05 % external solution      diltiazem (CARDIZEM CD) 240 MG 24 hr capsule Cartia XT 240 mg capsule,extended release     diltiazem (TIAZAC) 180 MG 24 hr capsule diltiazem CD 180 mg capsule,extended release 24 hr     fluticasone (CUTIVATE) 0.05 % cream      hydrOXYzine (ATARAX/VISTARIL) 25 MG tablet Take 1 tablet (25 mg total) by mouth every evening. 30 tablet 5   levocetirizine (XYZAL) 5 MG tablet Take 2 tablets (10 mg total) by mouth every evening. 60 tablet 5   losartan (COZAAR) 100 MG tablet Take 100 mg by mouth daily.     meloxicam (MOBIC) 15 MG tablet Take 15 mg by mouth daily.     ofloxacin (OCUFLOX) 0.3 %  ophthalmic solution      omeprazole (PRILOSEC) 20 MG capsule Take 20 mg by mouth daily.     polycarbophil (FIBERCON) 625 MG tablet Take 625 mg by mouth as needed.     triamcinolone ointment (KENALOG) 0.1 % Apply 1 application topically 2 (two) times daily. 454 g 3   Current Facility-Administered Medications  Medication Dose Route Frequency Provider Last Rate Last Admin   dupilumab (DUPIXENT) prefilled syringe 300 mg  300 mg Subcutaneous Q14 Days Gallagher, Joel Louis, MD   300 mg at 03/07/22 1048    PHYSICAL EXAMINATION: ECOG PERFORMANCE STATUS: 1 - Symptomatic but completely ambulatory  Vitals:   03/09/22 1506  BP: (!) 180/57  Pulse: 70  Resp: 17  Temp: 97.7 F (36.5 C)  SpO2: 100%   Filed Weights   03/09/22 1506  Weight: 118 lb 12.8 oz (53.9 kg)    BREAST: No palpable masses or nodules in either right or left breasts. No palpable axillary supraclavicular or infraclavicular adenopathy no breast tenderness or nipple discharge. (exam performed in the presence of a chaperone)  LABORATORY DATA:  I have reviewed the data as listed    Latest Ref Rng & Units 11/07/2017    2:11 PM 11/01/2016    3:39 PM 02/04/2013   12:00 PM  CMP  Glucose 70 - 140   mg/dL 122   119   BUN 7 - 26 mg/dL 15   22   Creatinine 0.60 - 1.10 mg/dL 0.78  0.70  0.69   Sodium 136 - 145 mmol/L 136   134   Potassium 3.5 - 5.1 mmol/L 3.2   3.5   Chloride 98 - 109 mmol/L 103   96   CO2 22 - 29 mmol/L 25   27   Calcium 8.4 - 10.4 mg/dL 9.5   9.4   Total Protein 6.4 - 8.3 g/dL 6.7     Total Bilirubin 0.2 - 1.2 mg/dL 0.7     Alkaline Phos 40 - 150 U/L 128     AST 5 - 34 U/L 15     ALT 0 - 55 U/L 15       Lab Results  Component Value Date   WBC 8.0 11/07/2017   HGB 12.1 11/07/2017   HCT 34.4 (L) 11/07/2017   MCV 97.5 11/07/2017   PLT 282 11/07/2017   NEUTROABS 5.2 11/07/2017    ASSESSMENT & PLAN:  Monoallelic mutation of CHEK2 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 Based on NCCN guidelines, this is a pathogenic mutation  that has been associated with risk of not only breast cancer but also colon, thyroid and kidney cancers   Breast cancer surveillance: Breast MRI 02/09/2022: No evidence of malignancy, density category B Mammogram 07/13/2021: Benign breast density category C Breast exam 03/09/2022: Benign   We will plan to do breast MRIs every other year.  Next MRI will be done in July 2025      Other cancer surveillance: Apart of breast cancer, there are no definite surveillance approaches to kidney cancer. For colon cancer she will need a colonoscopy  every 5 years. For thyroid cancer, she will need manual thyroid examinations for any lumps or nodules.   Return to clinic in 1 year for follow-up.    No orders of the defined types were placed in this encounter.  The patient has a good understanding of the overall plan. she agrees with it. she will call with any problems that may develop before the next visit here. Total time spent: 30 mins including face to face time and time spent for planning, charting and co-ordination of care   Harriette Ohara, MD 03/09/22    I Gardiner Coins am scribing for Dr. Lindi Adie  I have reviewed the above documentation for accuracy and completeness, and I agree with the above.

## 2022-03-06 DIAGNOSIS — E7800 Pure hypercholesterolemia, unspecified: Secondary | ICD-10-CM | POA: Diagnosis not present

## 2022-03-06 DIAGNOSIS — E559 Vitamin D deficiency, unspecified: Secondary | ICD-10-CM | POA: Diagnosis not present

## 2022-03-06 DIAGNOSIS — I10 Essential (primary) hypertension: Secondary | ICD-10-CM | POA: Diagnosis not present

## 2022-03-06 DIAGNOSIS — Z0000 Encounter for general adult medical examination without abnormal findings: Secondary | ICD-10-CM | POA: Diagnosis not present

## 2022-03-06 DIAGNOSIS — K219 Gastro-esophageal reflux disease without esophagitis: Secondary | ICD-10-CM | POA: Diagnosis not present

## 2022-03-06 DIAGNOSIS — M1990 Unspecified osteoarthritis, unspecified site: Secondary | ICD-10-CM | POA: Diagnosis not present

## 2022-03-06 DIAGNOSIS — R21 Rash and other nonspecific skin eruption: Secondary | ICD-10-CM | POA: Diagnosis not present

## 2022-03-06 DIAGNOSIS — D692 Other nonthrombocytopenic purpura: Secondary | ICD-10-CM | POA: Diagnosis not present

## 2022-03-06 DIAGNOSIS — M549 Dorsalgia, unspecified: Secondary | ICD-10-CM | POA: Diagnosis not present

## 2022-03-07 ENCOUNTER — Ambulatory Visit (INDEPENDENT_AMBULATORY_CARE_PROVIDER_SITE_OTHER): Payer: Medicare PPO | Admitting: *Deleted

## 2022-03-07 DIAGNOSIS — L209 Atopic dermatitis, unspecified: Secondary | ICD-10-CM

## 2022-03-09 ENCOUNTER — Other Ambulatory Visit: Payer: Self-pay

## 2022-03-09 ENCOUNTER — Inpatient Hospital Stay: Payer: Medicare PPO | Attending: Hematology and Oncology | Admitting: Hematology and Oncology

## 2022-03-09 DIAGNOSIS — Z1231 Encounter for screening mammogram for malignant neoplasm of breast: Secondary | ICD-10-CM

## 2022-03-09 DIAGNOSIS — Z1502 Genetic susceptibility to malignant neoplasm of ovary: Secondary | ICD-10-CM | POA: Diagnosis not present

## 2022-03-09 DIAGNOSIS — Z1589 Genetic susceptibility to other disease: Secondary | ICD-10-CM

## 2022-03-09 DIAGNOSIS — Z1509 Genetic susceptibility to other malignant neoplasm: Secondary | ICD-10-CM | POA: Diagnosis not present

## 2022-03-09 DIAGNOSIS — Z1501 Genetic susceptibility to malignant neoplasm of breast: Secondary | ICD-10-CM | POA: Insufficient documentation

## 2022-03-09 NOTE — Assessment & Plan Note (Signed)
Based on NCCN guidelines, this is a pathogenic mutation that has been associated with risk of not only breast cancer but also colon, thyroid and kidney cancers  Breast cancer surveillance: Breast MRI7/13/2023:No evidence of malignancy, density category B Mammogram12/14/2022: Benign breast density category C Breast exam 03/09/2022: Benign  We will plan to do breast MRIs every other year. Next MRI will be done in July 2025    Other cancer surveillance: Apart of breast cancer, there are no definite surveillance approaches to kidney cancer. For colon cancer she will need a colonoscopyevery 5 years. For thyroid cancer,she will need manual thyroid examinations for any lumps or nodules.  Return to clinic in 1 year for follow-up.

## 2022-03-21 ENCOUNTER — Ambulatory Visit (INDEPENDENT_AMBULATORY_CARE_PROVIDER_SITE_OTHER): Payer: Medicare PPO | Admitting: *Deleted

## 2022-03-21 DIAGNOSIS — L209 Atopic dermatitis, unspecified: Secondary | ICD-10-CM

## 2022-03-24 ENCOUNTER — Ambulatory Visit: Payer: Medicare PPO | Admitting: Orthopedic Surgery

## 2022-04-05 ENCOUNTER — Ambulatory Visit: Payer: Medicare PPO | Admitting: Orthopedic Surgery

## 2022-04-05 ENCOUNTER — Ambulatory Visit (INDEPENDENT_AMBULATORY_CARE_PROVIDER_SITE_OTHER): Payer: Medicare PPO

## 2022-04-05 ENCOUNTER — Telehealth: Payer: Self-pay

## 2022-04-05 DIAGNOSIS — M545 Low back pain, unspecified: Secondary | ICD-10-CM

## 2022-04-05 DIAGNOSIS — M25562 Pain in left knee: Secondary | ICD-10-CM | POA: Diagnosis not present

## 2022-04-05 DIAGNOSIS — M1712 Unilateral primary osteoarthritis, left knee: Secondary | ICD-10-CM | POA: Diagnosis not present

## 2022-04-05 NOTE — Telephone Encounter (Signed)
Auth needed for left knee gel injection

## 2022-04-06 ENCOUNTER — Encounter: Payer: Self-pay | Admitting: Orthopedic Surgery

## 2022-04-06 MED ORDER — LIDOCAINE HCL 1 % IJ SOLN
5.0000 mL | INTRAMUSCULAR | Status: AC | PRN
  Administered 2022-04-05: 5 mL

## 2022-04-06 MED ORDER — METHYLPREDNISOLONE ACETATE 40 MG/ML IJ SUSP
40.0000 mg | INTRAMUSCULAR | Status: AC | PRN
  Administered 2022-04-05: 40 mg via INTRA_ARTICULAR

## 2022-04-06 MED ORDER — BUPIVACAINE HCL 0.25 % IJ SOLN
4.0000 mL | INTRAMUSCULAR | Status: AC | PRN
  Administered 2022-04-05: 4 mL via INTRA_ARTICULAR

## 2022-04-06 NOTE — Progress Notes (Signed)
Office Visit Note   Patient: Elaine Carter           Date of Birth: 1941-05-10           MRN: [id number] Visit Date: 04/05/2022 Requested by: Gaynelle Arabian, MD [address] [place],  [place] [address] PCP: Gaynelle Arabian, MD  Subjective: Chief Complaint  Patient presents with   Left Knee - Pain   Lower Back - Pain    HPI: Elaine Carter is an 81 year old female with left-sided back pain as well as left knee pain.  She had a fall last week and describes bruising the anteromedial aspect of her knee.  She has been able to weight-bear since that time.  Taking meloxicam for her symptoms.  Denies any weakness or giving way.  Has known history of knee arthritis.  She also reports left-sided back pain but no radicular leg pain or numbness and tingling in the leg.  Denies any symptoms that wake her from sleep.  She does feel asymmetry when she is trying to sit in a chair due to prominence of the left side of her thoracic cavity compared to the right side.              ROS: All systems reviewed are negative as they relate to the chief complaint within the history of present illness.  Patient denies  fevers or chills.   Assessment & Plan: Visit Diagnoses:  1. Left knee pain, unspecified chronicity   2. Low back pain, unspecified back pain laterality, unspecified chronicity, unspecified whether sciatica present     Plan: Impression is left knee pain with arthritis present.  Right knee also has some arthritis but is not as symptomatic.  Aspiration injection performed in the left knee today.  Patient also has some scoliosis which is likely giving her the asymmetric feeling when she is trying to sit.  No real radicular symptoms and in general based on the amount of scoliosis that is present she is not particularly symptomatic.  No intervention required for that in terms of therapy or exercises.  Would also like to preapproved for left knee gel injection for when this cortisone shot wears off.   Follow-up in approximately 8 to 12 weeks as needed for gel injection This patient is diagnosed with osteoarthritis of the knee(s).    Radiographs show evidence of joint space narrowing, osteophytes, subchondral sclerosis and/or subchondral cysts.  This patient has knee pain which interferes with functional and activities of daily living.    This patient has experienced inadequate response, adverse effects and/or intolerance with conservative treatments such as acetaminophen, NSAIDS, topical creams, physical therapy or regular exercise, knee bracing and/or weight loss.   This patient has experienced inadequate response or has a contraindication to intra articular steroid injections for at least 3 months.   This patient is not scheduled to have a total knee replacement within 6 months of starting treatment with viscosupplementation.   Follow-Up Instructions: No follow-ups on file.   Orders:  Orders Placed This Encounter  Procedures   XR KNEE 3 VIEW LEFT   XR Lumbar Spine 2-3 Views   No orders of the defined types were placed in this encounter.     Procedures: Large Joint Inj: L knee on 04/05/2022 8:20 AM Indications: diagnostic evaluation, joint swelling and pain Details: 18 G 1.5 in needle, superolateral approach  Arthrogram: No  Medications: 5 mL lidocaine 1 %; 40 mg methylPREDNISolone acetate 40 MG/ML; 4 mL bupivacaine 0.25 %  Outcome: tolerated well, no immediate complications Procedure, treatment alternatives, risks and benefits explained, specific risks discussed. Consent was given by the patient. Immediately prior to procedure a time out was called to verify the correct patient, procedure, equipment, support staff and site/side marked as required. Patient was prepped and draped in the usual sterile fashion.       Clinical Data: No additional findings.  Objective: Vital Signs: There were no vitals taken for this visit.  Physical Exam:   Constitutional: Patient appears  well-developed HEENT:  Head: Normocephalic Eyes:EOM are normal Neck: Normal range of motion Cardiovascular: Normal rate Pulmonary/chest: Effort normal Neurologic: Patient is alert Skin: Skin is warm Psychiatric: Patient has normal mood and affect   Ortho Exam: Ortho exam demonstrates full active and passive range of motion of both hips and both ankles.  Pedal pulses palpable.  5 out of 5 ankle dorsiflexion plantarflexion quad and hamstring strength with no groin pain with internal/external rotation of either leg.  Significant patellofemoral crepitus is present bilaterally with mild effusion left knee no effusion right knee.  Extensor mechanism intact.  Collateral and cruciate ligaments are stable.  Does have scoliosis visible in her thoracic or lumbar spine with forward bending.  No paresthesias L1-S1 bilaterally.  Specialty Comments:  No specialty comments available.  Imaging: No results found.   PMFS History: Patient Active Problem List   Diagnosis Date Noted   Allergic rhinitis 11/11/2021   Flexural atopic dermatitis 09/27/2021   Prurigo nodularis 09/27/2021   Contusion of toe 06/02/2020   Genetic testing 16/94/5038   Monoallelic mutation of CHEK2 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    Family history of cancer    Lipoma of arm 02/06/2013   Personal history of breast cancer 07/31/1996   Past Medical History:  Diagnosis Date   Angio-edema    Arthritis    Constipation    Contact lens/glasses fitting    wears contacts or glasses   Diverticular disease    Family history of cancer    Genetic testing 09/01/2016   Test results: 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  Genes tested: 43 genes on [facility]'s Common Cancers panel (APC, ATM, AXIN2, BARD1, BMPR1A, BRCA1, BRCA2, BRIP1, CDH1, CDKN2A, CHEK2, DICER1, EPCAM, GREM1, HOXB13, KIT, MEN1, MLH1, MSH2, MSH6, MUTYH, NBN, NF1, PALB2, PDGFRA, PMS2, POLD1, POLE, PTEN, RAD50, RAD51C, RAD51D, SDHA, SDHB, SDHC, SDHD,  SMAD4, SMARCA4,    GERD (gastroesophageal reflux disease)    Hypertension    Monoallelic mutation of CHEK2 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    Pathogenic CHEK2 mutation c.591delA (p.Val198Phefs*7) @ [facility]   Personal history of breast cancer 1998   left breast; estrogen positive    Family History  Problem Relation Age of Onset   Prostate cancer Maternal Uncle        Dx 29s; deceased 51s   Stomach cancer Paternal Uncle        Dx 63s; deceased 39   Cancer Maternal Uncle        unk. type   Cancer Maternal Uncle        throat ca (smoker); deceased 39s   Leukemia Cousin        mat female 1st cousin; deceased 28   Breast cancer Other        mat grandmother's sister    Past Surgical History:  Procedure Laterality Date   ABDOMINAL HYSTERECTOMY  2012   vag hystBSO   ANTERIOR AND POSTERIOR REPAIR  2012   bladder tape sliong-cysto along with hyst  BREAST BIOPSY  1998   lt   BREAST LUMPECTOMY  1998   left   COLONOSCOPY     x3   LIPOMA EXCISION Left 02/06/2013   Procedure: EXCISION OF LEFT UPPER ARM LIPOMA;  Surgeon: Theodoro Kos, DO;  Location: [facility];  Service: Plastics;  Laterality: Left;   TUBAL LIGATION     Social History   Occupational History   Not on file  Tobacco Use   Smoking status: Former    Types: Cigarettes    Quit date: 02/04/1999    Years since quitting: 23.1   Smokeless tobacco: Never  Substance and Sexual Activity   Alcohol use: Yes    Comment: 2 wines daily   Drug use: No   Sexual activity: Not on file

## 2022-04-07 NOTE — Telephone Encounter (Signed)
VOB submitted for Monovisc, left knee 

## 2022-04-11 ENCOUNTER — Ambulatory Visit (INDEPENDENT_AMBULATORY_CARE_PROVIDER_SITE_OTHER): Payer: Medicare PPO

## 2022-04-11 DIAGNOSIS — L209 Atopic dermatitis, unspecified: Secondary | ICD-10-CM

## 2022-04-13 ENCOUNTER — Ambulatory Visit: Payer: Medicare PPO

## 2022-04-25 ENCOUNTER — Ambulatory Visit: Payer: Medicare PPO

## 2022-04-25 ENCOUNTER — Ambulatory Visit (INDEPENDENT_AMBULATORY_CARE_PROVIDER_SITE_OTHER): Payer: Medicare PPO

## 2022-04-25 DIAGNOSIS — L209 Atopic dermatitis, unspecified: Secondary | ICD-10-CM

## 2022-05-01 DIAGNOSIS — R140 Abdominal distension (gaseous): Secondary | ICD-10-CM | POA: Diagnosis not present

## 2022-05-01 DIAGNOSIS — R194 Change in bowel habit: Secondary | ICD-10-CM | POA: Diagnosis not present

## 2022-05-01 DIAGNOSIS — Z86010 Personal history of colonic polyps: Secondary | ICD-10-CM | POA: Diagnosis not present

## 2022-05-05 ENCOUNTER — Other Ambulatory Visit: Payer: Self-pay

## 2022-05-05 DIAGNOSIS — M1712 Unilateral primary osteoarthritis, left knee: Secondary | ICD-10-CM

## 2022-05-08 ENCOUNTER — Ambulatory Visit: Payer: Medicare PPO | Admitting: Orthopedic Surgery

## 2022-05-08 DIAGNOSIS — M1712 Unilateral primary osteoarthritis, left knee: Secondary | ICD-10-CM | POA: Diagnosis not present

## 2022-05-09 ENCOUNTER — Ambulatory Visit (INDEPENDENT_AMBULATORY_CARE_PROVIDER_SITE_OTHER): Payer: Medicare PPO | Admitting: *Deleted

## 2022-05-09 ENCOUNTER — Ambulatory Visit: Payer: Medicare PPO

## 2022-05-09 DIAGNOSIS — L209 Atopic dermatitis, unspecified: Secondary | ICD-10-CM

## 2022-05-11 ENCOUNTER — Encounter: Payer: Self-pay | Admitting: Orthopedic Surgery

## 2022-05-11 NOTE — Progress Notes (Signed)
   Procedure Note  Patient: Elaine Carter             Date of Birth: May 21, 1941           MRN: [id number]             Visit Date: 05/08/2022  Procedures: Visit Diagnoses:  1. Arthritis of left knee     Large Joint Inj: L knee on 05/08/2022 7:28 AM Indications: pain, joint swelling and diagnostic evaluation Details: 18 G 1.5 in needle, superolateral approach  Arthrogram: No  Medications: 5 mL lidocaine 1 %; 88 mg Hyaluronan 88 MG/4ML Outcome: tolerated well, no immediate complications Procedure, treatment alternatives, risks and benefits explained, specific risks discussed. Consent was given by the patient. Immediately prior to procedure a time out was called to verify the correct patient, procedure, equipment, support staff and site/side marked as required. Patient was prepped and draped in the usual sterile fashion.     Lot #[id number]

## 2022-05-14 MED ORDER — HYALURONAN 88 MG/4ML IX SOSY
88.0000 mg | PREFILLED_SYRINGE | INTRA_ARTICULAR | Status: AC | PRN
  Administered 2022-05-08: 88 mg via INTRA_ARTICULAR

## 2022-05-14 MED ORDER — LIDOCAINE HCL 1 % IJ SOLN
5.0000 mL | INTRAMUSCULAR | Status: AC | PRN
  Administered 2022-05-08: 5 mL

## 2022-05-20 DIAGNOSIS — Z23 Encounter for immunization: Secondary | ICD-10-CM | POA: Diagnosis not present

## 2022-05-22 ENCOUNTER — Ambulatory Visit (INDEPENDENT_AMBULATORY_CARE_PROVIDER_SITE_OTHER): Payer: Medicare PPO

## 2022-05-22 DIAGNOSIS — L209 Atopic dermatitis, unspecified: Secondary | ICD-10-CM | POA: Diagnosis not present

## 2022-05-25 ENCOUNTER — Other Ambulatory Visit: Payer: Self-pay | Admitting: *Deleted

## 2022-05-25 MED ORDER — DUPIXENT 300 MG/2ML ~~LOC~~ SOSY: 300.0000 mg | PREFILLED_SYRINGE | SUBCUTANEOUS | 11 refills | Status: DC

## 2022-06-05 ENCOUNTER — Ambulatory Visit (INDEPENDENT_AMBULATORY_CARE_PROVIDER_SITE_OTHER): Payer: Medicare PPO

## 2022-06-05 DIAGNOSIS — L209 Atopic dermatitis, unspecified: Secondary | ICD-10-CM | POA: Diagnosis not present

## 2022-06-16 ENCOUNTER — Other Ambulatory Visit: Payer: Self-pay | Admitting: Hematology and Oncology

## 2022-06-16 DIAGNOSIS — Z1231 Encounter for screening mammogram for malignant neoplasm of breast: Secondary | ICD-10-CM

## 2022-06-28 ENCOUNTER — Ambulatory Visit (INDEPENDENT_AMBULATORY_CARE_PROVIDER_SITE_OTHER): Payer: Medicare PPO

## 2022-06-28 DIAGNOSIS — L209 Atopic dermatitis, unspecified: Secondary | ICD-10-CM | POA: Diagnosis not present

## 2022-07-11 ENCOUNTER — Ambulatory Visit: Payer: Medicare PPO

## 2022-07-13 ENCOUNTER — Ambulatory Visit: Payer: Medicare PPO

## 2022-07-18 ENCOUNTER — Ambulatory Visit (INDEPENDENT_AMBULATORY_CARE_PROVIDER_SITE_OTHER): Payer: Medicare PPO

## 2022-07-18 DIAGNOSIS — L209 Atopic dermatitis, unspecified: Secondary | ICD-10-CM | POA: Diagnosis not present

## 2022-08-01 ENCOUNTER — Ambulatory Visit (INDEPENDENT_AMBULATORY_CARE_PROVIDER_SITE_OTHER): Payer: Medicare PPO | Admitting: *Deleted

## 2022-08-01 ENCOUNTER — Ambulatory Visit: Payer: Medicare PPO

## 2022-08-01 DIAGNOSIS — L209 Atopic dermatitis, unspecified: Secondary | ICD-10-CM

## 2022-08-10 ENCOUNTER — Ambulatory Visit
Admission: RE | Admit: 2022-08-10 | Discharge: 2022-08-10 | Disposition: A | Discharge status: Home / Self Care | Payer: Medicare PPO | Source: Ambulatory Visit | Attending: Hematology and Oncology | Admitting: Hematology and Oncology

## 2022-08-10 DIAGNOSIS — Z1231 Encounter for screening mammogram for malignant neoplasm of breast: Secondary | ICD-10-CM

## 2022-08-15 ENCOUNTER — Ambulatory Visit (INDEPENDENT_AMBULATORY_CARE_PROVIDER_SITE_OTHER): Payer: Medicare PPO

## 2022-08-15 DIAGNOSIS — L209 Atopic dermatitis, unspecified: Secondary | ICD-10-CM

## 2022-08-18 DIAGNOSIS — H9190 Unspecified hearing loss, unspecified ear: Secondary | ICD-10-CM | POA: Diagnosis not present

## 2022-08-29 ENCOUNTER — Ambulatory Visit (INDEPENDENT_AMBULATORY_CARE_PROVIDER_SITE_OTHER): Payer: Medicare PPO

## 2022-08-29 DIAGNOSIS — L209 Atopic dermatitis, unspecified: Secondary | ICD-10-CM

## 2022-09-15 ENCOUNTER — Ambulatory Visit (INDEPENDENT_AMBULATORY_CARE_PROVIDER_SITE_OTHER): Payer: Medicare PPO | Admitting: *Deleted

## 2022-09-15 DIAGNOSIS — L209 Atopic dermatitis, unspecified: Secondary | ICD-10-CM

## 2022-09-18 DIAGNOSIS — D225 Melanocytic nevi of trunk: Secondary | ICD-10-CM | POA: Diagnosis not present

## 2022-09-18 DIAGNOSIS — L281 Prurigo nodularis: Secondary | ICD-10-CM | POA: Diagnosis not present

## 2022-09-18 DIAGNOSIS — Z1283 Encounter for screening for malignant neoplasm of skin: Secondary | ICD-10-CM | POA: Diagnosis not present

## 2022-09-21 DIAGNOSIS — Z1211 Encounter for screening for malignant neoplasm of colon: Secondary | ICD-10-CM | POA: Diagnosis not present

## 2022-09-21 DIAGNOSIS — Z86010 Personal history of colonic polyps: Secondary | ICD-10-CM | POA: Diagnosis not present

## 2022-09-27 DIAGNOSIS — H9190 Unspecified hearing loss, unspecified ear: Secondary | ICD-10-CM | POA: Diagnosis not present

## 2022-09-27 DIAGNOSIS — E7800 Pure hypercholesterolemia, unspecified: Secondary | ICD-10-CM | POA: Diagnosis not present

## 2022-09-27 DIAGNOSIS — K219 Gastro-esophageal reflux disease without esophagitis: Secondary | ICD-10-CM | POA: Diagnosis not present

## 2022-09-27 DIAGNOSIS — I739 Peripheral vascular disease, unspecified: Secondary | ICD-10-CM | POA: Diagnosis not present

## 2022-09-27 DIAGNOSIS — M1990 Unspecified osteoarthritis, unspecified site: Secondary | ICD-10-CM | POA: Diagnosis not present

## 2022-09-27 DIAGNOSIS — I10 Essential (primary) hypertension: Secondary | ICD-10-CM | POA: Diagnosis not present

## 2022-09-27 DIAGNOSIS — E559 Vitamin D deficiency, unspecified: Secondary | ICD-10-CM | POA: Diagnosis not present

## 2022-09-28 ENCOUNTER — Other Ambulatory Visit: Payer: Self-pay

## 2022-09-28 ENCOUNTER — Ambulatory Visit: Payer: Medicare PPO | Admitting: Family Medicine

## 2022-09-28 ENCOUNTER — Encounter: Payer: Self-pay | Admitting: Family Medicine

## 2022-09-28 VITALS — BP 158/74 | HR 78 | Temp 98.4°F | Resp 18 | Ht 60.0 in | Wt 119.5 lb

## 2022-09-28 DIAGNOSIS — L209 Atopic dermatitis, unspecified: Secondary | ICD-10-CM

## 2022-09-28 DIAGNOSIS — L281 Prurigo nodularis: Secondary | ICD-10-CM

## 2022-09-28 NOTE — Patient Instructions (Addendum)
Atopic dermatitis/purigo nodularis Continue a daily moisturizing routine Continue Dupixent injections 300 mg once every 2 weeks Continue triamcinolone to red and itchy areas under your face up to twice a day as needed   Your blood pressure was elevated at today's visit.  Follow-up with your primary care provider for further evaluation of your blood pressure a soon as possible  Follow up with your dermatologist as recommended  Call the clinic if this treatment plan is not working well for you  Follow up in 1 year or sooner if needed.  Skin care recommendations   Bath time: Always use lukewarm water. AVOID very hot or cold water. Keep bathing time to 5-10 minutes. Do NOT use bubble bath. Use a mild soap and use just enough to wash the dirty areas. Do NOT scrub skin vigorously.  After bathing, pat dry your skin with a towel. Do NOT rub or scrub the skin.   Moisturizers and prescriptions:  ALWAYS apply moisturizers immediately after bathing (within 3 minutes). This helps to lock-in moisture. Use the moisturizer several times a day over the whole body. Good summer moisturizers include: Aveeno, CeraVe, Cetaphil. Good winter moisturizers include: Aquaphor, Vaseline, Cerave, Cetaphil, Eucerin, Vanicream. When using moisturizers along with medications, the moisturizer should be applied about one hour after applying the medication to prevent diluting effect of the medication or moisturize around where you applied the medications. When not using medications, the moisturizer can be continued twice daily as maintenance.   Laundry and clothing: Avoid laundry products with added color or perfumes. Use unscented hypo-allergenic laundry products such as Tide free, Cheer free & gentle, and All free and clear.  If the skin still seems dry or sensitive, you can try double-rinsing the clothes. Avoid tight or scratchy clothing such as wool. Do not use fabric softeners or dyer sheets.

## 2022-09-28 NOTE — Progress Notes (Addendum)
[place] [address] Dept: [phone]  FOLLOW UP NOTE  Patient ID: Elaine Carter, female    DOB: 1940/08/03  Age: 82 y.o. MRN: [id number] Date of Office Visit: 09/28/2022  Assessment  Chief Complaint: Flexural atopic dermatitis and Follow-up  HPI Elaine Carter is an 82 year old female who presents to the clinic for follow-up visit.  She was last seen in this clinic 09/07/2021 by Dr. Ernst Bowler for evaluation of flexural atopic dermatitis and Perrigo nodularis.  At today's visit, she reports that her atopic dermatitis has been much more well-controlled with only occasional red and itchy areas occurring in a flare in remission pattern.  She continues a daily moisturizing routine as well as Dupixent 300 mg once every 2 weeks.  She denies large or local reactions while continuing on Dupixent.  She reports a significant decrease in her symptoms of atopic dermatitis and purgio nodularis while continuing on Dupixent injections.  She does report 1 small red and itchy and scaly area that occurs on the outer aspect of her right ankle and an additional red and itchy area in the left popliteal fossa for which she continues topical steroid treatment with relief of symptoms.  She does report that she recently had a cortisone injection in these areas from her dermatology specialist.  Her current medications are listed in the chart.    Drug Allergies:  Allergies  Allergen Reactions   Codeine Nausea And Vomiting   Sulfa Antibiotics    Tape Itching   Dover Two-Sided Adhesive Strap [Attends Briefs Small] Rash    Physical Exam: BP (!) 158/74 (BP Location: Left Arm, Cuff Size: Normal)   Pulse 78   Temp 98.4 F (36.9 C) (Temporal)   Resp 18   Ht 5' (1.524 m)   Wt 119 lb 8 oz (54.2 kg)   SpO2 98%   BMI 23.34 kg/m    Physical Exam Vitals reviewed.  Constitutional:      Appearance: Normal appearance.  HENT:     Head: Normocephalic and atraumatic.     Right Ear: Tympanic membrane  normal.     Left Ear: Tympanic membrane normal.     Nose:     Comments: Bilateral nares slightly erythematous with clear nasal drainage noted.  Pharynx normal.  Ears normal.  Eyes normal.    Mouth/Throat:     Pharynx: Oropharynx is clear.  Eyes:     Conjunctiva/sclera: Conjunctivae normal.  Cardiovascular:     Rate and Rhythm: Normal rate and regular rhythm.     Heart sounds: Normal heart sounds. No murmur heard. Pulmonary:     Effort: Pulmonary effort is normal.     Breath sounds: Normal breath sounds.     Comments: Lungs clear to auscultation Musculoskeletal:     Cervical back: Normal range of motion and neck supple.  Skin:    General: Skin is warm and dry.     Comments: Right outer aspect of ankle with red, dry, scaly eczematous area.  No open areas or drainage.  Left popliteal fossa with small slightly red eczematous area.  No open areas or drainage.  Neurological:     Mental Status: She is alert and oriented to person, place, and time.  Psychiatric:        Mood and Affect: Mood normal.        Behavior: Behavior normal.        Thought Content: Thought content normal.        Judgment: Judgment normal.  Assessment and Plan: 1. Atopic dermatitis, unspecified type   2. Prurigo nodularis     No orders of the defined types were placed in this encounter.   Patient Instructions  Atopic dermatitis/purigo nodularis Continue a daily moisturizing routine Continue Dupixent injections 300 mg once every 2 weeks Continue triamcinolone to red and itchy areas under your face up to twice a day as needed  Follow up with your dermatologist as recommended  Call the clinic if this treatment plan is not working well for you  Follow up in 1 year or sooner if needed.   Return in about 1 year (around 09/28/2023), or if symptoms worsen or fail to improve.    Thank you for the opportunity to care for this patient.  Please do not hesitate to contact me with questions.  Gareth Morgan,  FNP [facility]

## 2022-09-28 NOTE — Progress Notes (Deleted)
   FOLLOW UP  Date of Service/Encounter:  09/28/22   Assessment:   Atopic dermatitis  - much better controlled on Dupixent    Prurigo nodularis - hopefully will improve over the long-term with Dupixent  Plan/Recommendations:    There are no Patient Instructions on file for this visit.   Subjective:   Elaine Carter is a 82 y.o. female presenting today for follow up of No chief complaint on file.   LACOSTA AX has a history of the following: Patient Active Problem List   Diagnosis Date Noted   Allergic rhinitis 11/11/2021   Flexural atopic dermatitis 09/27/2021   Prurigo nodularis 09/27/2021   Contusion of toe 06/02/2020   Genetic testing 99991111   Monoallelic mutation of CHEK2 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    Family history of cancer    Lipoma of arm 02/06/2013   Personal history of breast cancer 07/31/1996    History obtained from: chart review and {Persons; PED relatives w/patient:19415::"patient"}.  Elaine Carter is a 82 y.o. female presenting for {Blank single:19197::"a food challenge","a drug challenge","skin testing","a sick visit","an evaluation of ***","a follow up visit"}. She was last seen in February 2023. At that time, she had a rash and we started levocetirizine as well as TAC. WE continued with clobetasol. She was also on Dupixent every two weeks and that seemed to be working well. She had lesions on her legs consistent with PN. t  Since the last visit.   {Blank single:19197::"Asthma/Respiratory Symptom History: ***"," "}  {Blank single:19197::"Allergic Rhinitis Symptom History: ***"," "}  {Blank single:19197::"Food Allergy Symptom History: ***"," "}  {Blank single:19197::"Skin Symptom History: ***"," "}  {Blank single:19197::"GERD Symptom History: ***"," "}  Otherwise, there have been no changes to her past medical history, surgical history, family history, or social history.    ROS     Objective:   There were no vitals taken for this visit. There  is no height or weight on file to calculate BMI.    Physical Exam   Diagnostic studies: {Blank single:19197::"none","deferred due to recent antihistamine use","labs sent instead"," "}  Spirometry: {Blank single:19197::"results normal (FEV1: ***%, FVC: ***%, FEV1/FVC: ***%)","results abnormal (FEV1: ***%, FVC: ***%, FEV1/FVC: ***%)"}.    {Blank single:19197::"Spirometry consistent with mild obstructive disease","Spirometry consistent with moderate obstructive disease","Spirometry consistent with severe obstructive disease","Spirometry consistent with possible restrictive disease","Spirometry consistent with mixed obstructive and restrictive disease","Spirometry uninterpretable due to technique","Spirometry consistent with normal pattern"}. {Blank single:19197::"Albuterol/Atrovent nebulizer","Xopenex/Atrovent nebulizer","Albuterol nebulizer","Albuterol four puffs via MDI","Xopenex four puffs via MDI"} treatment given in clinic with {Blank single:19197::"significant improvement in FEV1 per ATS criteria","significant improvement in FVC per ATS criteria","significant improvement in FEV1 and FVC per ATS criteria","improvement in FEV1, but not significant per ATS criteria","improvement in FVC, but not significant per ATS criteria","improvement in FEV1 and FVC, but not significant per ATS criteria","no improvement"}.  Allergy Studies: {Blank single:19197::"none","labs sent instead"," "}    {Blank single:19197::"Allergy testing results were read and interpreted by myself, documented by clinical staff."," "}      Salvatore Marvel, MD  [facility]

## 2022-09-29 ENCOUNTER — Encounter: Payer: Self-pay | Admitting: Family Medicine

## 2022-09-29 DIAGNOSIS — R194 Change in bowel habit: Secondary | ICD-10-CM | POA: Diagnosis not present

## 2022-09-29 DIAGNOSIS — Z1211 Encounter for screening for malignant neoplasm of colon: Secondary | ICD-10-CM | POA: Diagnosis not present

## 2022-09-29 DIAGNOSIS — Z86010 Personal history of colonic polyps: Secondary | ICD-10-CM | POA: Diagnosis not present

## 2022-09-29 DIAGNOSIS — R140 Abdominal distension (gaseous): Secondary | ICD-10-CM | POA: Diagnosis not present

## 2022-10-02 ENCOUNTER — Ambulatory Visit (INDEPENDENT_AMBULATORY_CARE_PROVIDER_SITE_OTHER): Payer: Medicare PPO

## 2022-10-02 DIAGNOSIS — L209 Atopic dermatitis, unspecified: Secondary | ICD-10-CM | POA: Diagnosis not present

## 2022-10-16 ENCOUNTER — Ambulatory Visit (INDEPENDENT_AMBULATORY_CARE_PROVIDER_SITE_OTHER): Payer: Medicare PPO | Admitting: *Deleted

## 2022-10-16 DIAGNOSIS — L209 Atopic dermatitis, unspecified: Secondary | ICD-10-CM

## 2022-11-02 ENCOUNTER — Ambulatory Visit (INDEPENDENT_AMBULATORY_CARE_PROVIDER_SITE_OTHER): Payer: Medicare PPO

## 2022-11-02 DIAGNOSIS — L209 Atopic dermatitis, unspecified: Secondary | ICD-10-CM

## 2022-11-09 DIAGNOSIS — R195 Other fecal abnormalities: Secondary | ICD-10-CM | POA: Diagnosis not present

## 2022-11-16 ENCOUNTER — Ambulatory Visit: Payer: Medicare PPO

## 2022-11-21 ENCOUNTER — Ambulatory Visit (INDEPENDENT_AMBULATORY_CARE_PROVIDER_SITE_OTHER): Payer: Medicare PPO

## 2022-11-21 DIAGNOSIS — L209 Atopic dermatitis, unspecified: Secondary | ICD-10-CM

## 2022-12-06 ENCOUNTER — Ambulatory Visit (INDEPENDENT_AMBULATORY_CARE_PROVIDER_SITE_OTHER): Payer: Medicare PPO

## 2022-12-06 DIAGNOSIS — L209 Atopic dermatitis, unspecified: Secondary | ICD-10-CM

## 2022-12-28 ENCOUNTER — Ambulatory Visit (INDEPENDENT_AMBULATORY_CARE_PROVIDER_SITE_OTHER): Payer: Medicare PPO

## 2022-12-28 DIAGNOSIS — L209 Atopic dermatitis, unspecified: Secondary | ICD-10-CM

## 2023-01-10 ENCOUNTER — Ambulatory Visit (INDEPENDENT_AMBULATORY_CARE_PROVIDER_SITE_OTHER): Payer: Medicare PPO

## 2023-01-10 DIAGNOSIS — H5212 Myopia, left eye: Secondary | ICD-10-CM | POA: Diagnosis not present

## 2023-01-10 DIAGNOSIS — H0100A Unspecified blepharitis right eye, upper and lower eyelids: Secondary | ICD-10-CM | POA: Diagnosis not present

## 2023-01-10 DIAGNOSIS — H0100B Unspecified blepharitis left eye, upper and lower eyelids: Secondary | ICD-10-CM | POA: Diagnosis not present

## 2023-01-10 DIAGNOSIS — H35033 Hypertensive retinopathy, bilateral: Secondary | ICD-10-CM | POA: Diagnosis not present

## 2023-01-10 DIAGNOSIS — H26491 Other secondary cataract, right eye: Secondary | ICD-10-CM | POA: Diagnosis not present

## 2023-01-10 DIAGNOSIS — L209 Atopic dermatitis, unspecified: Secondary | ICD-10-CM | POA: Diagnosis not present

## 2023-01-10 DIAGNOSIS — H524 Presbyopia: Secondary | ICD-10-CM | POA: Diagnosis not present

## 2023-01-10 DIAGNOSIS — H04123 Dry eye syndrome of bilateral lacrimal glands: Secondary | ICD-10-CM | POA: Diagnosis not present

## 2023-01-24 ENCOUNTER — Ambulatory Visit: Payer: Medicare PPO

## 2023-01-26 ENCOUNTER — Ambulatory Visit (INDEPENDENT_AMBULATORY_CARE_PROVIDER_SITE_OTHER): Payer: Medicare PPO

## 2023-01-26 DIAGNOSIS — L209 Atopic dermatitis, unspecified: Secondary | ICD-10-CM | POA: Diagnosis not present

## 2023-02-09 ENCOUNTER — Ambulatory Visit: Payer: Medicare PPO

## 2023-02-12 ENCOUNTER — Ambulatory Visit (INDEPENDENT_AMBULATORY_CARE_PROVIDER_SITE_OTHER): Payer: Medicare PPO | Admitting: *Deleted

## 2023-02-12 DIAGNOSIS — L209 Atopic dermatitis, unspecified: Secondary | ICD-10-CM | POA: Diagnosis not present

## 2023-02-15 ENCOUNTER — Ambulatory Visit: Payer: Medicare PPO | Admitting: Orthopedic Surgery

## 2023-02-15 ENCOUNTER — Encounter: Payer: Self-pay | Admitting: Orthopedic Surgery

## 2023-02-15 ENCOUNTER — Telehealth: Payer: Self-pay

## 2023-02-15 DIAGNOSIS — M545 Low back pain, unspecified: Secondary | ICD-10-CM | POA: Diagnosis not present

## 2023-02-15 DIAGNOSIS — M1712 Unilateral primary osteoarthritis, left knee: Secondary | ICD-10-CM

## 2023-02-15 NOTE — Addendum Note (Signed)
Addended byPrescott Parma on: 02/15/2023 01:49 PM   Modules accepted: Orders

## 2023-02-15 NOTE — Telephone Encounter (Signed)
Auth needed for left knee gel 

## 2023-02-15 NOTE — Progress Notes (Signed)
Office Visit Note   Patient: Elaine Carter           Date of Birth: 01-21-1941           MRN: [id number] Visit Date: 02/15/2023 Requested by: Blair Heys, MD [address] [place],  [place] [address] PCP: Blair Heys, MD  Subjective: Chief Complaint  Patient presents with   Lower Back - Pain    HPI: Elaine Carter is a 82 y.o. female who presents to the office reporting continued left knee pain as well as low back pain.  Patient had a gel injection in October of last year.  Those notes were reviewed.  She also had radiographs of her lumbar spine done at that time which showed scoliosis along with some spondylolisthesis.  Having continued low back pain with almost any activities which require exertion.  Also reports continued left knee medial pain..                ROS: All systems reviewed are negative as they relate to the chief complaint within the history of present illness.  Patient denies fevers or chills.  Assessment & Plan: Visit Diagnoses:  1. Arthritis of left knee   2. Low back pain, unspecified back pain laterality, unspecified chronicity, unspecified whether sciatica present     Plan: Impression is left knee arthritis and low back pain.  Plan is to reorder gel injection.  We talked about cortisone injection today but her knee is not hurting quite that much.  Regarding her back I think this is something that she does not want to have surgery for.  She has been doing Silver sneakers and rehab on her own for the last 6 to 8 weeks.  I think MRI scan of her back is indicated to allow for epidural steroid injections.  MRI scan pending.  Physical therapy formally also requested to assist with low back pain.  Follow-up with Dr. Theresia Bough after the MRI scan on her back so she can talk about getting epidural steroid injections.  Follow-Up Instructions: No follow-ups on file.   Orders:  No orders of the defined types were placed in this encounter.  No orders of the  defined types were placed in this encounter.     Procedures: No procedures performed   Clinical Data: No additional findings.  Objective: Vital Signs: There were no vitals taken for this visit.  Physical Exam:  Constitutional: Patient appears well-developed HEENT:  Head: Normocephalic Eyes:EOM are normal Neck: Normal range of motion Cardiovascular: Normal rate Pulmonary/chest: Effort normal Neurologic: Patient is alert Skin: Skin is warm Psychiatric: Patient has normal mood and affect  Ortho Exam: Ortho exam demonstrates full active and passive range of motion of the lumbar spine.  No groin pain with internal ex rotation of the leg.  No left knee effusion.  Does have medial greater lateral joint line tenderness on the left-hand side.  Stable collateral cruciate ligaments.  Extensor mechanism intact.  Specialty Comments:  No specialty comments available.  Imaging: No results found.   PMFS History: Patient Active Problem List   Diagnosis Date Noted   Allergic rhinitis 11/11/2021   Flexural atopic dermatitis 09/27/2021   Prurigo nodularis 09/27/2021   Contusion of toe 06/02/2020   Genetic testing 09/01/2016   Monoallelic mutation of CHEK2 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    Family history of cancer    Lipoma of arm 02/06/2013   Personal history of breast cancer 07/31/1996   Past Medical History:  Diagnosis Date   Angio-edema    Arthritis    Constipation    Contact lens/glasses fitting    wears contacts or glasses   Diverticular disease    Family history of cancer    Genetic testing 09/01/2016   Test results: 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  Genes tested: 43 genes on [facility]'s Common Cancers panel (APC, ATM, AXIN2, BARD1, BMPR1A, BRCA1, BRCA2, BRIP1, CDH1, CDKN2A, CHEK2, DICER1, EPCAM, GREM1, HOXB13, KIT, MEN1, MLH1, MSH2, MSH6, MUTYH, NBN, NF1, PALB2, PDGFRA, PMS2, POLD1, POLE, PTEN, RAD50, RAD51C, RAD51D, SDHA, SDHB, SDHC, SDHD,  SMAD4, SMARCA4,    GERD (gastroesophageal reflux disease)    Hypertension    Monoallelic mutation of CHEK2 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    Pathogenic CHEK2 mutation c.591delA (p.Val198Phefs*7) @ [facility]   Personal history of breast cancer 1998   left breast; estrogen positive    Family History  Problem Relation Age of Onset   Prostate cancer Maternal Uncle        Dx 91s; deceased 44s   Stomach cancer Paternal Uncle        Dx 29s; deceased 81   Cancer Maternal Uncle        unk. type   Cancer Maternal Uncle        throat ca (smoker); deceased 28s   Leukemia Cousin        mat female 1st cousin; deceased 81   Breast cancer Other        mat grandmother's sister    Past Surgical History:  Procedure Laterality Date   ABDOMINAL HYSTERECTOMY  2012   vag hystBSO   ANTERIOR AND POSTERIOR REPAIR  2012   bladder tape sliong-cysto along with hyst   BREAST BIOPSY  1998   lt   BREAST LUMPECTOMY  1998   left   COLONOSCOPY     x3   LIPOMA EXCISION Left 02/06/2013   Procedure: EXCISION OF LEFT UPPER ARM LIPOMA;  Surgeon: Wayland Denis, DO;  Location: [facility];  Service: Plastics;  Laterality: Left;   TUBAL LIGATION     Social History   Occupational History   Not on file  Tobacco Use   Smoking status: Former    Current packs/day: 0.00    Types: Cigarettes    Quit date: 02/04/1999    Years since quitting: 24.0   Smokeless tobacco: Never  Substance and Sexual Activity   Alcohol use: Yes    Comment: 2 wines daily   Drug use: No   Sexual activity: Not on file

## 2023-02-16 NOTE — Telephone Encounter (Signed)
VOB submitted for Monovisc, left knee 

## 2023-02-28 ENCOUNTER — Ambulatory Visit: Payer: Medicare PPO | Admitting: Rehabilitative and Restorative Service Providers"

## 2023-03-02 ENCOUNTER — Ambulatory Visit (INDEPENDENT_AMBULATORY_CARE_PROVIDER_SITE_OTHER): Payer: Medicare PPO

## 2023-03-02 DIAGNOSIS — L209 Atopic dermatitis, unspecified: Secondary | ICD-10-CM

## 2023-03-04 ENCOUNTER — Other Ambulatory Visit: Payer: Medicare PPO

## 2023-03-06 NOTE — Progress Notes (Signed)
Patient Care Team: Blair Heys, MD as PCP - General (Family Medicine)  DIAGNOSIS:  Encounter Diagnosis  Name Primary?   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 with above-mentioned history of Chek 2 mutation who is currently on surveillance. She presents to the clinic today for a follow-up. Patient is doing well. She denies any pain or discomfort in breast.   ALLERGIES:  is allergic to codeine, indapamide, lisinopril, sulfa antibiotics, dover two-sided adhesive strap [attends briefs small], fexofenadine hcl, and tape.  MEDICATIONS:  Current Outpatient Medications  Medication Sig Dispense Refill   Acetaminophen (TYLENOL DISSOLVE PACKS) 500 MG PACK 2 tablets     amLODipine (NORVASC) 5 MG tablet Take 5 mg by mouth daily.     aspirin EC 81 MG tablet Take 81 mg by mouth daily.     calcium citrate-vitamin D (CITRACAL+D) 315-200 MG-UNIT tablet Take 1 tablet by mouth daily.     chlorpheniramine (CHLOR-TRIMETON) 4 MG tablet Take 4 mg by mouth 2 (two) times daily as needed for allergies.     cholecalciferol (VITAMIN D) 1000 UNITS tablet Take 1,000 Units by mouth daily. 2000u     diltiazem (TIAZAC) 180 MG 24 hr capsule      dupilumab (DUPIXENT) 300 MG/2ML prefilled syringe Inject 300 mg into the skin every 14 (fourteen) days. 4 mL 11   losartan (COZAAR) 100 MG tablet Take 100 mg by mouth daily.     meloxicam (MOBIC) 15 MG tablet Take 15 mg by mouth daily.     omeprazole (PRILOSEC) 20 MG capsule Take 20 mg by mouth daily.     polycarbophil (FIBERCON) 625 MG tablet Take 625 mg by mouth as needed.     triamcinolone ointment (KENALOG) 0.1 % Apply 1 application topically 2 (two) times daily. 454 g 3   Azelastine HCl 137 MCG/SPRAY SOLN 2 sprays in each nostril Nasally *Twice a day (Patient not taking: Reported on 03/12/2023)     chlorhexidine  (PERIDEX) 0.12 % solution Use as directed 15 mLs in the mouth or throat as needed.     clobetasol (TEMOVATE) 0.05 % external solution  (Patient not taking: Reported on 09/28/2022)     fluocinonide gel (LIDEX) 0.05 %      fluticasone (CUTIVATE) 0.05 % cream      hydrOXYzine (ATARAX/VISTARIL) 25 MG tablet Take 1 tablet (25 mg total) by mouth every evening. 30 tablet 5   levocetirizine (XYZAL) 5 MG tablet Take 2 tablets (10 mg total) by mouth every evening. 60 tablet 5   ofloxacin (OCUFLOX) 0.3 % ophthalmic solution      Current Facility-Administered Medications  Medication Dose Route Frequency Provider Last Rate Last Admin   dupilumab (DUPIXENT) prefilled syringe 300 mg  300 mg Subcutaneous Q14 Days Alfonse Spruce, MD   300 mg at 03/02/23 1030    PHYSICAL EXAMINATION: ECOG PERFORMANCE STATUS: 1 - Symptomatic but completely ambulatory  Vitals:   03/12/23 1520  BP: (!) 168/60  Pulse: 89  Resp: 17  Temp: 99.7 F (37.6 C)  SpO2: 99%   Filed Weights   03/12/23 1520  Weight: 118 lb 4.8 oz (53.7 kg)    BREAST: No palpable masses or nodules in either right or left breasts. No palpable axillary supraclavicular or infraclavicular adenopathy no breast tenderness or nipple discharge. (exam performed in the presence of a  chaperone)  LABORATORY DATA:  I have reviewed the data as listed    Latest Ref Rng & Units 11/07/2017    2:11 PM 11/01/2016    3:39 PM 02/04/2013   12:00 PM  CMP  Glucose 70 - 140 mg/dL 811   914   BUN 7 - 26 mg/dL 15   22   Creatinine 7.82 - 1.10 mg/dL 9.56  2.13  0.86   Sodium 136 - 145 mmol/L 136   134   Potassium 3.5 - 5.1 mmol/L 3.2   3.5   Chloride 98 - 109 mmol/L 103   96   CO2 22 - 29 mmol/L 25   27   Calcium 8.4 - 10.4 mg/dL 9.5   9.4   Total Protein 6.4 - 8.3 g/dL 6.7     Total Bilirubin 0.2 - 1.2 mg/dL 0.7     Alkaline Phos 40 - 150 U/L 128     AST 5 - 34 U/L 15     ALT 0 - 55 U/L 15       Lab Results  Component Value Date   WBC 8.0 11/07/2017    HGB 12.1 11/07/2017   HCT 34.4 (L) 11/07/2017   MCV 97.5 11/07/2017   PLT 282 11/07/2017   NEUTROABS 5.2 11/07/2017    ASSESSMENT & PLAN:  Monoallelic mutation of CHEK2 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 Based on NCCN guidelines, this is a pathogenic mutation that has been associated with risk of not only breast cancer but also colon, thyroid and kidney cancers   Breast cancer surveillance: Breast MRI 02/09/2022: No evidence of malignancy, density category B Mammogram 08/11/2022: Benign breast density category C Breast exam 03/12/2023: Benign   We will plan to do breast MRIs every other year.  Next MRI will be done in July 2025      Other cancer surveillance: Apart of breast cancer, there are no definite surveillance approaches to kidney cancer. For colon cancer she will need a colonoscopy  every 5 years. For thyroid cancer, she will need manual thyroid examinations for any lumps or nodules. Patient is not very keen on doing colonoscopy because she is anxious about going under anesthesia.  She will discuss with her primary care physician about Cologuard.   Return to clinic in 1 year for follow-up.      Orders Placed This Encounter  Procedures   MR BREAST BILATERAL W WO CONTRAST INC CAD    Standing Status:   Future    Standing Expiration Date:   03/11/2024    Order Specific Question:   If indicated for the ordered procedure, I authorize the administration of contrast media per Radiology protocol    Answer:   Yes    Order Specific Question:   What is the patient's sedation requirement?    Answer:   No Sedation    Order Specific Question:   Does the patient have a pacemaker or implanted devices?    Answer:   No    Order Specific Question:   Preferred imaging location?    Answer:   [facility] (table limit-550lbs)   The patient has a good understanding of the overall plan. she agrees with it. she will call with any problems that may develop before the next visit here. Total time spent: 30  mins including face to face time and time spent for planning, charting and co-ordination of care   Tamsen Meek, MD 03/12/23    I Janan Ridge am acting as a  scribe for Dr. Pamelia Hoit  I have reviewed the above documentation for accuracy and completeness, and I agree with the above.

## 2023-03-07 ENCOUNTER — Encounter: Payer: Self-pay | Admitting: Rehabilitative and Restorative Service Providers"

## 2023-03-07 ENCOUNTER — Other Ambulatory Visit: Payer: Self-pay

## 2023-03-07 ENCOUNTER — Ambulatory Visit: Payer: Medicare PPO | Admitting: Rehabilitative and Restorative Service Providers"

## 2023-03-07 DIAGNOSIS — M5459 Other low back pain: Secondary | ICD-10-CM

## 2023-03-07 DIAGNOSIS — M6281 Muscle weakness (generalized): Secondary | ICD-10-CM | POA: Diagnosis not present

## 2023-03-07 DIAGNOSIS — R293 Abnormal posture: Secondary | ICD-10-CM | POA: Diagnosis not present

## 2023-03-07 NOTE — Therapy (Signed)
OUTPATIENT PHYSICAL THERAPY THORACOLUMBAR EVALUATION Referring diagnosis?  Diagnosis  M54.50 (ICD-10-CM) - Low back pain, unspecified back pain laterality, unspecified chronicity, unspecified whether sciatica present   Treatment diagnosis? R29.3   M62.81   M54.59 What was this (referring dx) caused by? []  Surgery []  Fall []  Ongoing issue [x]  Arthritis []  Other: ____________  Laterality: []  Rt [x]  Lt []  Both  Check all possible CPT codes:  *CHOOSE 10 OR LESS*    []  97110 (Therapeutic Exercise)  []  78295 (SLP Treatment)  []  97112 (Neuro Re-ed)   []  62130 (Swallowing Treatment)   []  97116 (Gait Training)   []  [id number] (Cognitive Training, 1st 15 minutes) []  97140 (Manual Therapy)   []  97130 (Cognitive Training, each add'l 15 minutes)  []  97164 (Re-evaluation)                              []  Other, List CPT Code ____________  []  97530 (Therapeutic Activities)     []  97535 (Self Care)   [x]  All codes above (97110 - 97535)  []  97012 (Mechanical Traction)  []  97014 (E-stim Unattended)  []  97032 (E-stim manual)  []  97033 (Ionto)  []  97035 (Ultrasound) []  97750 (Physical Performance Training) []  [id number] (Aquatic Therapy) []  97016 (Vasopneumatic Device) []  [id number] (Paraffin) []  97034 (Contrast Bath) []  97597 (Wound Care 1st 20 sq cm) []  97598 (Wound Care each add'l 20 sq cm) []  97760 (Orthotic Fabrication, Fitting, Training Initial) []  [id number] (Prosthetic Management and Training Initial) []  [id number] (Orthotic or Prosthetic Training/ Modification Subsequent)   Patient Name: Elaine Carter MRN: [id number] DOB:03/16/1941, 82 y.o., female Today's Date: 03/07/2023  END OF SESSION:  PT End of Session - 03/07/23 1756     Visit Number 1    Number of Visits 12    Date for PT Re-Evaluation 05/02/23    Authorization Type [employer]    Authorization - Visit Number 1    Authorization - Number of Visits 12    Progress Note Due on Visit 12    PT Start Time 1343    PT Stop Time 1428    PT Time  Calculation (min) 45 min    Activity Tolerance Patient tolerated treatment well;No increased pain;Patient limited by pain    Behavior During Therapy Elaine Carter for tasks assessed/performed             Past Medical History:  Diagnosis Date   Angio-edema    Arthritis    Constipation    Contact lens/glasses fitting    wears contacts or glasses   Diverticular disease    Family history of cancer    Genetic testing 09/01/2016   Test results: 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) @ [facility]   Personal history of breast cancer 1998   left breast; estrogen positive   Past Surgical History:  Procedure Laterality Date   ABDOMINAL HYSTERECTOMY  2012   vag hystBSO   ANTERIOR AND POSTERIOR REPAIR  2012   bladder tape sliong-cysto along with  hyst   BREAST BIOPSY  1998   lt   BREAST LUMPECTOMY  1998   left   COLONOSCOPY     x3   LIPOMA EXCISION Left 02/06/2013   Procedure: EXCISION OF LEFT UPPER ARM LIPOMA;  Surgeon: Elaine Denis, DO;  Location: [facility];  Service: Plastics;  Laterality: Left;   TUBAL LIGATION     Patient Active Problem List   Diagnosis Date Noted   Allergic rhinitis 11/11/2021   Flexural atopic dermatitis 09/27/2021   Prurigo nodularis 09/27/2021   Contusion of toe 06/02/2020   Genetic testing 09/01/2016   Monoallelic mutation of CHEK2 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    Family history of cancer    Lipoma of arm 02/06/2013   Personal history of breast  cancer 07/31/1996    PCP: Elaine Heys, MD  REFERRING PROVIDER: Julieanne Cotton, PA-C  REFERRING DIAG:  Diagnosis  M54.50 (ICD-10-CM) - Low back pain, unspecified back pain laterality, unspecified chronicity, unspecified whether sciatica present    Rationale for Evaluation and Treatment: Rehabilitation  THERAPY DIAG:  Abnormal posture  Muscle weakness (generalized)  Other low back pain  ONSET DATE: Chronic, at least fall of 2023  SUBJECTIVE:                                                                                                                                                                                           SUBJECTIVE STATEMENT: Elaine Carter had a "gel shot" in her left knee in the fall of 2023.  She noted her back (left scapular area) has been bothering her as well.  She knows she has a left convex scoliosis.  No complaints of peripheral pain or paresthesias.  She is active in [employer].  PERTINENT HISTORY:  Breast cancer, foot OA, HTN  PAIN:  Are you having pain? Yes: NPRS scale: 0-4/10 the past 5 days on a 10/10 Pain location: Left scapular Pain description: Ache, sharp, spasm Aggravating factors: Flexion (sweeping, bending, lifting) Relieving factors: Resting, tylenol  PRECAUTIONS: Back  RED FLAGS: None   WEIGHT BEARING RESTRICTIONS: No  FALLS:  Has patient fallen in last 6 months? No  LIVING ENVIRONMENT: Lives with: lives alone Lives in: House/apartment Stairs:  Does OK, usually uses elevator Has following equipment at home:  Has a cane that she uses occasionally  OCCUPATION: Retired  PLOF: Independent  PATIENT GOALS: Be able to do normal functional activities without left scapular pain  NEXT MD VISIT: MRI 03/27/2023 and Dr. August Carter 04/04/2023  OBJECTIVE:   DIAGNOSTIC FINDINGS:  AP lateral radiographs lumbar spine reviewed.  L4-5 spondylolisthesis is  present.  Scoliosis also present  in the thoracolumbar region.  No acute   fractures.  Facet arthritis which is moderate also noted.   PATIENT SURVEYS:  FOTO 57 (risk adjusted 56,Goal 65 in 11 visits)  SCREENING FOR RED FLAGS: Bowel or bladder incontinence: No Spinal tumors: No Cauda equina syndrome: No Compression fracture: No Abdominal aneurysm: No  COGNITION: Overall cognitive status: Within functional limits for tasks assessed     SENSATION: No complaints of peripheral pain or paresthesias  MUSCLE LENGTH: Hamstrings: Right 35 deg; Left 35 deg  POSTURE: rounded shoulders, forward head, decreased lumbar lordosis, flexed trunk , and left convex scoliosis  LUMBAR ROM:   AROM 03/06/2022  Flexion   Extension 10  Right lateral flexion 30  Left lateral flexion 20  Right rotation   Left rotation    (Blank rows = not tested)  LOWER EXTREMITY ROM:     Passive  Left/Right 04/07/2023   Hip flexion 85/85   Hip extension    Hip abduction    Hip adduction    Hip internal rotation 7/10   Hip external rotation 34/34   Knee flexion    Knee extension    Ankle dorsiflexion    Ankle plantarflexion    Ankle inversion    Ankle eversion     (Blank rows = not tested)  STRENGTH:    MMT Left/Right 03/07/2023   Hip flexion    Hip extension    Hip abduction    Hip adduction    Hip internal rotation    Hip external rotation    Knee flexion    Knee extension    Ankle dorsiflexion    Ankle plantarflexion    Ankle inversion    Ankle eversion     (Blank rows = not tested)  GAIT: Distance walked: 100 feet Assistive device utilized:  None, although she does occasionally use a cane Level of assistance: Complete Independence Comments: Flexed posture noted with all activity  TODAY'S TREATMENT:                                                                                                                              DATE: 03/07/2023 Lumbar extension 10 x 3 seconds Lumbar left lateral bending with left hand on left greater trochanter and right arm overhead  10 x 3 seconds Shoulder blade pinches 10 x 5 seconds  Functional Activities: Reviewed imaging, exam findings and home exercise program    PATIENT EDUCATION:  Education details: See above Person educated: Patient Education method: Explanation, Demonstration, Tactile cues, Verbal cues, and Handouts Education comprehension: verbalized understanding, returned demonstration, verbal cues required, tactile cues required, and needs further education  HOME EXERCISE PROGRAM: [id number]  ASSESSMENT:  CLINICAL IMPRESSION: Patient is a 82 y.o. female who was seen today for physical therapy evaluation and treatment for back and left scapular pain.  Gwendolynn presents in a flexed posture which appears to be the primary cause of her left scapular pain.  We started addressing  this today with some postural correction and strengthening exercises.  She will also benefit from appropriate hip stretching and quadriceps strengthening as chronic knee pain will limit her ability to use good body mechanics for her back.  Her prognosis is good to meet the below listed goals.  OBJECTIVE IMPAIRMENTS: Abnormal gait, decreased activity tolerance, decreased endurance, decreased knowledge of condition, difficulty walking, decreased ROM, decreased strength, decreased safety awareness, impaired flexibility, improper body mechanics, postural dysfunction, and pain.   ACTIVITY LIMITATIONS: carrying, lifting, bending, standing, squatting, and locomotion level  PARTICIPATION LIMITATIONS: community activity  PERSONAL FACTORS: Breast cancer, foot OA, HTN are also affecting patient's functional outcome.   REHAB POTENTIAL: Good  CLINICAL DECISION MAKING: Stable/uncomplicated  EVALUATION COMPLEXITY: Low   GOALS: Goals reviewed with patient? Yes  SHORT TERM GOALS: Target date: 04/04/2023  Illeana will be independent with her day 1 home exercise program Baseline: Started 03/07/2023 Goal status: INITIAL  2.  Anjelica will have an  improved postural awareness and will be able to implement this into static and dynamic activities Baseline: Started education 03/07/2023 Goal status: INITIAL  3.  Sharlyn will be able to reduce left scapular pain with postural correction and therapeutic exercises Baseline: Unable at evaluation Goal status: INITIAL   LONG TERM GOALS: Target date: 05/02/2023  Improve FOTO to 65 in 11 visits Baseline: 57, high risk adjusted 56 Goal status: INITIAL  2.  Yalonda will report left scapular and back pain consistently 0-3/10 on the numeric pain rating scale Baseline: 4/10 Goal status: INITIAL  3.  Improve bilateral hip flexors flexibility to 95 degrees and hamstrings to 50 degrees Baseline: 85 degrees and 35 degrees respectively Goal status: INITIAL  4.  Alessia will have improved postural and spine strength as assessed by pain and FOTO functional scores Baseline: See pain and functional scores above Goal status: INITIAL  5.  Larkin will be independent with a long-term home exercise maintenance program at discharge Baseline: Started 03/07/2023 Goal status: INITIAL  PLAN:  PT FREQUENCY: 1-2x/week  PT DURATION: 8 weeks  PLANNED INTERVENTIONS: Therapeutic exercises, Therapeutic activity, Neuromuscular re-education, Balance training, Gait training, Patient/Family education, Self Care, Cryotherapy, and Manual therapy.  PLAN FOR NEXT SESSION: Review day 1 home exercise program, progress postural and body mechanics work, add appropriate hip flexors and hamstring stretching along with possibly bridging, heel toe raises and hip hiking when appropriate.  When appropriate, scapular strengthening such as rows and shoulder external rotation with Thera-Band.   Cherlyn Cushing, PT, MPT 03/07/2023, 6:13 PM

## 2023-03-09 ENCOUNTER — Encounter: Payer: Self-pay | Admitting: Rehabilitative and Restorative Service Providers"

## 2023-03-09 ENCOUNTER — Ambulatory Visit: Payer: Medicare PPO | Admitting: Rehabilitative and Restorative Service Providers"

## 2023-03-09 DIAGNOSIS — M6281 Muscle weakness (generalized): Secondary | ICD-10-CM

## 2023-03-09 DIAGNOSIS — M5459 Other low back pain: Secondary | ICD-10-CM

## 2023-03-09 DIAGNOSIS — R293 Abnormal posture: Secondary | ICD-10-CM | POA: Diagnosis not present

## 2023-03-09 NOTE — Therapy (Signed)
OUTPATIENT PHYSICAL THERAPY THORACOLUMBAR TREATMENT  Referring diagnosis?  Diagnosis  M54.50 (ICD-10-CM) - Low back pain, unspecified back pain laterality, unspecified chronicity, unspecified whether sciatica present   Treatment diagnosis? R29.3   M62.81   M54.59 What was this (referring dx) caused by? []  Surgery []  Fall []  Ongoing issue [x]  Arthritis []  Other: ____________  Laterality: []  Rt [x]  Lt []  Both  Check all possible CPT codes:  *CHOOSE 10 OR LESS*    []  97110 (Therapeutic Exercise)  []  92507 (SLP Treatment)  []  97112 (Neuro Re-ed)   []  92526 (Swallowing Treatment)   []  97116 (Gait Training)   []  [id number] (Cognitive Training, 1st 15 minutes) []  97140 (Manual Therapy)   []  97130 (Cognitive Training, each add'l 15 minutes)  []  97164 (Re-evaluation)                              []  Other, List CPT Code ____________  []  97530 (Therapeutic Activities)     []  97535 (Self Care)   [x]  All codes above (97110 - 97535)  []  09811 (Mechanical Traction)  []  97014 (E-stim Unattended)  []  97032 (E-stim manual)  []  97033 (Ionto)  []  97035 (Ultrasound) []  97750 (Physical Performance Training) []  [id number] (Aquatic Therapy) []  97016 (Vasopneumatic Device) []  [id number] (Paraffin) []  97034 (Contrast Bath) []  97597 (Wound Care 1st 20 sq cm) []  97598 (Wound Care each add'l 20 sq cm) []  97760 (Orthotic Fabrication, Fitting, Training Initial) []  [id number] (Prosthetic Management and Training Initial) []  [id number] (Orthotic or Prosthetic Training/ Modification Subsequent)   Patient Name: Elaine Carter MRN: [id number] DOB:Feb 28, 1941, 82 y.o., female Today's Date: 03/09/2023  END OF SESSION:  PT End of Session - 03/09/23 1307     Visit Number 2    Number of Visits 12    Date for PT Re-Evaluation 05/02/23    Authorization Type [employer]    Authorization - Visit Number 2    Authorization - Number of Visits 12    Progress Note Due on Visit 12    PT Start Time 1305    PT Stop Time 1345    PT Time  Calculation (min) 40 min    Activity Tolerance Patient tolerated treatment well;No increased pain    Behavior During Therapy WFL for tasks assessed/performed              Past Medical History:  Diagnosis Date   Angio-edema    Arthritis    Constipation    Contact lens/glasses fitting    wears contacts or glasses   Diverticular disease    Family history of cancer    Genetic testing 09/01/2016   Test results: 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) @ [facility]   Personal history of breast cancer 1998   left breast; estrogen positive   Past Surgical History:  Procedure Laterality Date   ABDOMINAL HYSTERECTOMY  2012   vag hystBSO   ANTERIOR AND POSTERIOR REPAIR  2012   bladder tape sliong-cysto along with hyst  BREAST BIOPSY  1998   lt   BREAST LUMPECTOMY  1998   left   COLONOSCOPY     x3   LIPOMA EXCISION Left 02/06/2013   Procedure: EXCISION OF LEFT UPPER ARM LIPOMA;  Surgeon: Wayland Denis, DO;  Location: [facility];  Service: Plastics;  Laterality: Left;   TUBAL LIGATION     Patient Active Problem List   Diagnosis Date Noted   Allergic rhinitis 11/11/2021   Flexural atopic dermatitis 09/27/2021   Prurigo nodularis 09/27/2021   Contusion of toe 06/02/2020   Genetic testing 09/01/2016   Monoallelic mutation of CHEK2 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    Family history of cancer    Lipoma of arm 02/06/2013   Personal history of breast cancer 07/31/1996     PCP: Blair Heys, MD  REFERRING PROVIDER: Julieanne Cotton, PA-C  REFERRING DIAG:  Diagnosis  M54.50 (ICD-10-CM) - Low back pain, unspecified back pain laterality, unspecified chronicity, unspecified whether sciatica present    Rationale for Evaluation and Treatment: Rehabilitation  THERAPY DIAG:  Abnormal posture  Muscle weakness (generalized)  Other low back pain  ONSET DATE: Chronic, at least fall of 2023  SUBJECTIVE:                                                                                                                                                                                           SUBJECTIVE STATEMENT: Elaine Carter notes compliance with her day 1 home exercise program, although she does have questions about technique that she would like to address today.  Eval: Elaine Carter had a "gel shot" in her left knee in the fall of 2023.  She noted her back (left scapular area) has been bothering her as well.  She knows she has a left convex scoliosis.  No complaints of peripheral pain or paresthesias.  She is active in [employer].  PERTINENT HISTORY:  Breast cancer, foot OA, HTN  PAIN:  Are you having pain? Yes: NPRS scale: 0-4/10 the past 7 days on a 10/10 Pain location: Left scapular Pain description: Ache, sharp, spasm Aggravating factors: Flexion (sweeping, bending, lifting) Relieving factors: Resting, tylenol  PRECAUTIONS: Back  RED FLAGS: None   WEIGHT BEARING RESTRICTIONS: No  FALLS:  Has patient fallen in last 6 months? No  LIVING ENVIRONMENT: Lives with: lives alone Lives in: House/apartment Stairs:  Does OK, usually uses elevator Has following equipment at home:  Has a cane that she uses occasionally  OCCUPATION: Retired  PLOF: Independent  PATIENT GOALS: Be able to do normal functional activities without left scapular pain  NEXT MD VISIT: MRI 03/27/2023 and Dr. August Saucer 04/04/2023  OBJECTIVE:   DIAGNOSTIC FINDINGS:  AP lateral  radiographs lumbar spine reviewed.  L4-5 spondylolisthesis is  present.  Scoliosis also present in the thoracolumbar region.  No acute  fractures.  Facet arthritis which is moderate also noted.   PATIENT SURVEYS:  FOTO 57 (risk adjusted 56,Goal 65 in 11 visits)  SCREENING FOR RED FLAGS: Bowel or bladder incontinence: No Spinal tumors: No Cauda equina syndrome: No Compression fracture: No Abdominal aneurysm: No  COGNITION: Overall cognitive status: Within functional limits for tasks assessed     SENSATION: No complaints of peripheral pain or paresthesias  MUSCLE LENGTH: Hamstrings: Right 35 deg; Left 35 deg  POSTURE: rounded shoulders, forward head, decreased lumbar lordosis, flexed trunk , and left convex scoliosis  LUMBAR ROM:   AROM 03/06/2022  Flexion   Extension 10  Right lateral flexion 30  Left lateral flexion 20  Right rotation   Left rotation    (Blank rows = not tested)  LOWER EXTREMITY ROM:     Passive  Left/Right 04/07/2023   Hip flexion 85/85   Hip extension    Hip abduction    Hip adduction    Hip internal rotation 7/10   Hip external rotation 34/34   Knee flexion    Knee extension    Ankle dorsiflexion    Ankle plantarflexion    Ankle inversion    Ankle eversion     (Blank rows = not tested)  STRENGTH:    MMT Left/Right 03/07/2023   Hip flexion    Hip extension    Hip abduction    Hip adduction    Hip internal rotation    Hip external rotation    Knee flexion    Knee extension    Ankle dorsiflexion    Ankle plantarflexion    Ankle inversion    Ankle eversion     (Blank rows = not tested)  GAIT: Distance walked: 100 feet Assistive device utilized:  None, although she does occasionally use a cane Level of assistance: Complete Independence Comments: Flexed posture noted with all activity  TODAY'S TREATMENT:                                                                                                                              DATE:   03/09/2023 Lumbar extension (hips forward) 10 x 3 seconds Lumbar left lateral bending with left hand on left greater trochanter and right arm overhead 10 x 3 seconds Shoulder blade pinches 10 x 5 seconds Single knee to chest stretch (other leg straight) 4 x 20 seconds Modified Thomas stretch 4 x 20 seconds Attempted hamstrings stretch (stopped by cramping) Yoga bridge 10 x 3 seconds Heel to toe raises 10 x 3 seconds  Functional Activities: Reviewed postural basics and log roll technique for bed mobility   03/07/2023 Lumbar extension 10 x 3 seconds Lumbar left lateral bending with left hand on left greater trochanter and right arm overhead 10 x 3 seconds  Shoulder blade pinches 10 x 5 seconds  Functional Activities: Reviewed imaging, exam findings and home exercise program    PATIENT EDUCATION:  Education details: See above Person educated: Patient Education method: Explanation, Demonstration, Tactile cues, Verbal cues, and Handouts Education comprehension: verbalized understanding, returned demonstration, verbal cues required, tactile cues required, and needs further education  HOME EXERCISE PROGRAM: [id number]  ASSESSMENT:  CLINICAL IMPRESSION: Addressing Elaine Carter's flexed posture is a major focus of her physical therapy.  Flexed posture appears to be the primary cause of her left scapular pain.  We are addressing this with appropriate postural correction and strengthening exercises.  She will also benefit from appropriate hip stretching and quadriceps strengthening as chronic knee pain will limit her ability to use good body mechanics for her back.   OBJECTIVE IMPAIRMENTS: Abnormal gait, decreased activity tolerance, decreased endurance, decreased knowledge of condition, difficulty walking, decreased ROM, decreased strength, decreased safety awareness, impaired flexibility, improper body mechanics, postural dysfunction, and pain.   ACTIVITY LIMITATIONS: carrying, lifting, bending,  standing, squatting, and locomotion level  PARTICIPATION LIMITATIONS: community activity  PERSONAL FACTORS: Breast cancer, foot OA, HTN are also affecting patient's functional outcome.   REHAB POTENTIAL: Good  CLINICAL DECISION MAKING: Stable/uncomplicated  EVALUATION COMPLEXITY: Low   GOALS: Goals reviewed with patient? Yes  SHORT TERM GOALS: Target date: 04/04/2023  Elaine Carter will be independent with her day 1 home exercise program Baseline: Started 03/07/2023 Goal status: On Going 03/09/2023  2.  Elaine Carter will have an improved postural awareness and will be able to implement this into static and dynamic activities Baseline: Started education 03/07/2023 Goal status: On Going 03/09/2023  3.  Elaine Carter will be able to reduce left scapular pain with postural correction and therapeutic exercises Baseline: Unable at evaluation Goal status: INITIAL   LONG TERM GOALS: Target date: 05/02/2023  Improve FOTO to 65 in 11 visits Baseline: 57, high risk adjusted 56 Goal status: INITIAL  2.  Elaine Carter will report left scapular and back pain consistently 0-3/10 on the numeric pain rating scale Baseline: 4/10 Goal status: INITIAL  3.  Improve bilateral hip flexors flexibility to 95 degrees and hamstrings to 50 degrees Baseline: 85 degrees and 35 degrees respectively Goal status: INITIAL  4.  Elaine Carter will have improved postural and spine strength as assessed by pain and FOTO functional scores Baseline: See pain and functional scores above Goal status: INITIAL  5.  Elaine Carter will be independent with a long-term home exercise maintenance program at discharge Baseline: Started 03/07/2023 Goal status: INITIAL  PLAN:  PT FREQUENCY: 1-2x/week  PT DURATION: 8 weeks  PLANNED INTERVENTIONS: Therapeutic exercises, Therapeutic activity, Neuromuscular re-education, Balance training, Gait training, Patient/Family education, Self Care, Cryotherapy, and Manual therapy.  PLAN FOR NEXT SESSION: Review home exercise  program, progress postural and body mechanics work, add appropriate hamstrings stretching along with hip hiking when appropriate.  When appropriate, scapular strengthening such as rows and shoulder external rotation with Thera-Band.   Cherlyn Cushing, PT, MPT 03/09/2023, 4:18 PM

## 2023-03-12 ENCOUNTER — Other Ambulatory Visit: Payer: Self-pay

## 2023-03-12 ENCOUNTER — Inpatient Hospital Stay: Payer: Medicare PPO | Attending: Hematology and Oncology | Admitting: Hematology and Oncology

## 2023-03-12 VITALS — BP 168/60 | HR 89 | Temp 99.7°F | Resp 17 | Wt 118.3 lb

## 2023-03-12 DIAGNOSIS — Z1589 Genetic susceptibility to other disease: Secondary | ICD-10-CM | POA: Diagnosis not present

## 2023-03-12 DIAGNOSIS — Z1501 Genetic susceptibility to malignant neoplasm of breast: Secondary | ICD-10-CM | POA: Diagnosis not present

## 2023-03-12 DIAGNOSIS — Z1509 Genetic susceptibility to other malignant neoplasm: Secondary | ICD-10-CM | POA: Insufficient documentation

## 2023-03-12 DIAGNOSIS — Z1502 Genetic susceptibility to malignant neoplasm of ovary: Secondary | ICD-10-CM

## 2023-03-12 NOTE — Assessment & Plan Note (Addendum)
Based on NCCN guidelines, this is a pathogenic mutation that has been associated with risk of not only breast cancer but also colon, thyroid and kidney cancers   Breast cancer surveillance: Breast MRI 02/09/2022: No evidence of malignancy, density category B Mammogram 08/11/2022: Benign breast density category C Breast exam 03/12/2023: Benign   We will plan to do breast MRIs every other year.  Next MRI will be done in July 2025      Other cancer surveillance: Apart of breast cancer, there are no definite surveillance approaches to kidney cancer. For colon cancer she will need a colonoscopy  every 5 years. For thyroid cancer, she will need manual thyroid examinations for any lumps or nodules. Patient is not very keen on doing colonoscopy because she is anxious about going under anesthesia.  She will discuss with her primary care physician about Cologuard.   Return to clinic in 1 year for follow-up.

## 2023-03-14 DIAGNOSIS — Z9181 History of falling: Secondary | ICD-10-CM | POA: Diagnosis not present

## 2023-03-14 DIAGNOSIS — E7800 Pure hypercholesterolemia, unspecified: Secondary | ICD-10-CM | POA: Diagnosis not present

## 2023-03-14 DIAGNOSIS — K219 Gastro-esophageal reflux disease without esophagitis: Secondary | ICD-10-CM | POA: Diagnosis not present

## 2023-03-14 DIAGNOSIS — E559 Vitamin D deficiency, unspecified: Secondary | ICD-10-CM | POA: Diagnosis not present

## 2023-03-14 DIAGNOSIS — Z0000 Encounter for general adult medical examination without abnormal findings: Secondary | ICD-10-CM | POA: Diagnosis not present

## 2023-03-14 DIAGNOSIS — I10 Essential (primary) hypertension: Secondary | ICD-10-CM | POA: Diagnosis not present

## 2023-03-14 DIAGNOSIS — M1990 Unspecified osteoarthritis, unspecified site: Secondary | ICD-10-CM | POA: Diagnosis not present

## 2023-03-14 DIAGNOSIS — Z1331 Encounter for screening for depression: Secondary | ICD-10-CM | POA: Diagnosis not present

## 2023-03-15 ENCOUNTER — Ambulatory Visit: Payer: Medicare PPO | Admitting: Rehabilitative and Restorative Service Providers"

## 2023-03-15 ENCOUNTER — Encounter: Payer: Self-pay | Admitting: Rehabilitative and Restorative Service Providers"

## 2023-03-15 ENCOUNTER — Ambulatory Visit: Payer: Medicare PPO | Admitting: Hematology and Oncology

## 2023-03-15 DIAGNOSIS — M6281 Muscle weakness (generalized): Secondary | ICD-10-CM | POA: Diagnosis not present

## 2023-03-15 DIAGNOSIS — M5459 Other low back pain: Secondary | ICD-10-CM

## 2023-03-15 DIAGNOSIS — R293 Abnormal posture: Secondary | ICD-10-CM

## 2023-03-15 NOTE — Therapy (Signed)
OUTPATIENT PHYSICAL THERAPY THORACOLUMBAR TREATMENT  Referring diagnosis?  Diagnosis  M54.50 (ICD-10-CM) - Low back pain, unspecified back pain laterality, unspecified chronicity, unspecified whether sciatica present   Treatment diagnosis? R29.3   M62.81   M54.59 What was this (referring dx) caused by? []  Surgery []  Fall []  Ongoing issue [x]  Arthritis []  Other: ____________  Laterality: []  Rt [x]  Lt []  Both  Check all possible CPT codes:  *CHOOSE 10 OR LESS*    []  97110 (Therapeutic Exercise)  []  92507 (SLP Treatment)  []  97112 (Neuro Re-ed)   []  92526 (Swallowing Treatment)   []  97116 (Gait Training)   []  [id number] (Cognitive Training, 1st 15 minutes) []  97140 (Manual Therapy)   []  97130 (Cognitive Training, each add'l 15 minutes)  []  97164 (Re-evaluation)                              []  Other, List CPT Code ____________  []  97530 (Therapeutic Activities)     []  97535 (Self Care)   [x]  All codes above (97110 - 97535)  []  97012 (Mechanical Traction)  []  97014 (E-stim Unattended)  []  97032 (E-stim manual)  []  97033 (Ionto)  []  97035 (Ultrasound) []  97750 (Physical Performance Training) []  [id number] (Aquatic Therapy) []  97016 (Vasopneumatic Device) []  [id number] (Paraffin) []  97034 (Contrast Bath) []  97597 (Wound Care 1st 20 sq cm) []  97598 (Wound Care each add'l 20 sq cm) []  97760 (Orthotic Fabrication, Fitting, Training Initial) []  [id number] (Prosthetic Management and Training Initial) []  [id number] (Orthotic or Prosthetic Training/ Modification Subsequent)   Patient Name: TENEKA BATON MRN: [id number] DOB:04/25/1941, 82 y.o., female Today's Date: 03/15/2023  END OF SESSION:  PT End of Session - 03/15/23 0845     Visit Number 3    Number of Visits 12    Date for PT Re-Evaluation 05/02/23    Authorization Type [employer]    Authorization - Visit Number 3    Authorization - Number of Visits 12    Progress Note Due on Visit 12    PT Start Time 0845    PT Stop Time 0930    PT Time  Calculation (min) 45 min    Activity Tolerance Patient tolerated treatment well;No increased pain    Behavior During Therapy WFL for tasks assessed/performed               Past Medical History:  Diagnosis Date   Angio-edema    Arthritis    Constipation    Contact lens/glasses fitting    wears contacts or glasses   Diverticular disease    Family history of cancer    Genetic testing 09/01/2016   Test results: 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) @ [facility]   Personal history of breast cancer 1998   left breast; estrogen positive   Past Surgical History:  Procedure Laterality Date   ABDOMINAL HYSTERECTOMY  2012   vag hystBSO   ANTERIOR AND POSTERIOR REPAIR  2012   bladder tape sliong-cysto along with  hyst   BREAST BIOPSY  1998   lt   BREAST LUMPECTOMY  1998   left   COLONOSCOPY     x3   LIPOMA EXCISION Left 02/06/2013   Procedure: EXCISION OF LEFT UPPER ARM LIPOMA;  Surgeon: Wayland Denis, DO;  Location: [facility];  Service: Plastics;  Laterality: Left;   TUBAL LIGATION     Patient Active Problem List   Diagnosis Date Noted   Allergic rhinitis 11/11/2021   Flexural atopic dermatitis 09/27/2021   Prurigo nodularis 09/27/2021   Contusion of toe 06/02/2020   Genetic testing 09/01/2016   Monoallelic mutation of CHEK2 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    Family history of cancer    Lipoma of arm 02/06/2013   Personal history of breast cancer 07/31/1996     PCP: Blair Heys, MD  REFERRING PROVIDER: Julieanne Cotton, PA-C  REFERRING DIAG:  Diagnosis  M54.50 (ICD-10-CM) - Low back pain, unspecified back pain laterality, unspecified chronicity, unspecified whether sciatica present    Rationale for Evaluation and Treatment: Rehabilitation  THERAPY DIAG:  Abnormal posture  Muscle weakness (generalized)  Other low back pain  ONSET DATE: Chronic, at least fall of 2023  SUBJECTIVE:                                                                                                                                                                                           SUBJECTIVE STATEMENT: Jaleesa notes continued compliance with her home exercise program, although she still has questions about technique.  Eval: Oaklie had a "gel shot" in her left knee in the fall of 2023.  She noted her back (left scapular area) has been bothering her as well.  She knows she has a left convex scoliosis.  No complaints of peripheral pain or paresthesias.  She is active in [employer].  PERTINENT HISTORY:  Breast cancer, foot OA, HTN  PAIN:  Are you having pain? Yes: NPRS scale: 0-4/10 the past 7 days on a 10/10 Pain location: Left scapular Pain description: Ache, sharp, spasm Aggravating factors: Flexion (sweeping, bending, lifting) Relieving factors: Resting, tylenol  PRECAUTIONS: Back  RED FLAGS: None   WEIGHT BEARING RESTRICTIONS: No  FALLS:  Has patient fallen in last 6 months? No  LIVING ENVIRONMENT: Lives with: lives alone Lives in: House/apartment Stairs:  Does OK, usually uses elevator Has following equipment at home:  Has a cane that she uses occasionally  OCCUPATION: Retired  PLOF: Independent  PATIENT GOALS: Be able to do normal functional activities without left scapular pain  NEXT MD VISIT: MRI 03/27/2023 and Dr. August Saucer 04/04/2023  OBJECTIVE:   DIAGNOSTIC  FINDINGS:  AP lateral radiographs lumbar spine reviewed.  L4-5  spondylolisthesis is  present.  Scoliosis also present in the thoracolumbar region.  No acute  fractures.  Facet arthritis which is moderate also noted.   PATIENT SURVEYS:  FOTO 57 (risk adjusted 56,Goal 65 in 11 visits)  SCREENING FOR RED FLAGS: Bowel or bladder incontinence: No Spinal tumors: No Cauda equina syndrome: No Compression fracture: No Abdominal aneurysm: No  COGNITION: Overall cognitive status: Within functional limits for tasks assessed     SENSATION: No complaints of peripheral pain or paresthesias  MUSCLE LENGTH: Hamstrings: Right 35 deg; Left 35 deg  POSTURE: rounded shoulders, forward head, decreased lumbar lordosis, flexed trunk , and left convex scoliosis  LUMBAR ROM:   AROM 03/06/2022  Flexion   Extension 10  Right lateral flexion 30  Left lateral flexion 20  Right rotation   Left rotation    (Blank rows = not tested)  LOWER EXTREMITY ROM:     Passive  Left/Right 04/07/2023   Hip flexion 85/85   Hip extension    Hip abduction    Hip adduction    Hip internal rotation 7/10   Hip external rotation 34/34   Knee flexion    Knee extension    Ankle dorsiflexion    Ankle plantarflexion    Ankle inversion    Ankle eversion     (Blank rows = not tested)  STRENGTH:    MMT Left/Right 03/07/2023   Hip flexion    Hip extension    Hip abduction    Hip adduction    Hip internal rotation    Hip external rotation    Knee flexion    Knee extension    Ankle dorsiflexion    Ankle plantarflexion    Ankle inversion    Ankle eversion     (Blank rows = not tested)  GAIT: Distance walked: 100 feet Assistive device utilized:  None, although she does occasionally use a cane Level of assistance: Complete Independence Comments: Flexed posture noted with all activity  TODAY'S TREATMENT:                                                                                                                              DATE:  03/15/2023 Lumbar extension (hips  forward) 10 x 3 seconds Lumbar left lateral bending with left hand on left greater trochanter and right arm overhead 10 x 3 seconds Shoulder blade pinches 10 x 5 seconds Single knee to chest stretch (other leg straight) 4 x 20 seconds Modified Thomas stretch 4 x 20 seconds Hamstrings stretch 5 x 20 seconds Yoga bridge 10 x 5 seconds Heel to toe raises 10 x 3 seconds  Functional Activities: Reviewed postural basics and log roll technique for bed mobility   03/09/2023 Lumbar extension (hips forward) 10 x 3 seconds Lumbar left lateral bending with left hand on left greater trochanter and right arm overhead 10 x 3 seconds Shoulder blade  pinches 10 x 5 seconds Single knee to chest stretch (other leg straight) 4 x 20 seconds Modified Thomas stretch 4 x 20 seconds Attempted hamstrings stretch (stopped by cramping) Yoga bridge 10 x 3 seconds Heel to toe raises 10 x 3 seconds  Functional Activities: Reviewed postural basics and log roll technique for bed mobility   03/07/2023 Lumbar extension 10 x 3 seconds Lumbar left lateral bending with left hand on left greater trochanter and right arm overhead 10 x 3 seconds Shoulder blade pinches 10 x 5 seconds  Functional Activities: Reviewed imaging, exam findings and home exercise program    PATIENT EDUCATION:  Education details: See above Person educated: Patient Education method: Explanation, Demonstration, Tactile cues, Verbal cues, and Handouts Education comprehension: verbalized understanding, returned demonstration, verbal cues required, tactile cues required, and needs further education  HOME EXERCISE PROGRAM: [id number]  ASSESSMENT:  CLINICAL IMPRESSION: Correcting Laini's flexed posture remains a major focus of her physical therapy.  Flexed posture is a major contributor of her left scapular pain.  We continue to address this with appropriate postural correction, flexibility and strengthening exercises.  Hip stretching and  quadriceps strengthening will be advanced as appropriate as chronic knee pain also limits her ability to use good body mechanics for her back.   OBJECTIVE IMPAIRMENTS: Abnormal gait, decreased activity tolerance, decreased endurance, decreased knowledge of condition, difficulty walking, decreased ROM, decreased strength, decreased safety awareness, impaired flexibility, improper body mechanics, postural dysfunction, and pain.   ACTIVITY LIMITATIONS: carrying, lifting, bending, standing, squatting, and locomotion level  PARTICIPATION LIMITATIONS: community activity  PERSONAL FACTORS: Breast cancer, foot OA, HTN are also affecting patient's functional outcome.   REHAB POTENTIAL: Good  CLINICAL DECISION MAKING: Stable/uncomplicated  EVALUATION COMPLEXITY: Low   GOALS: Goals reviewed with patient? Yes  SHORT TERM GOALS: Target date: 04/04/2023  Vandi will be independent with her day 1 home exercise program Baseline: Started 03/07/2023 Goal status: On Going 03/15/2023  2.  Ahlani will have an improved postural awareness and will be able to implement this into static and dynamic activities Baseline: Started education 03/07/2023 Goal status: On Going 03/15/2023  3.  Jade will be able to reduce left scapular pain with postural correction and therapeutic exercises Baseline: Unable at evaluation Goal status: INITIAL   LONG TERM GOALS: Target date: 05/02/2023  Improve FOTO to 65 in 11 visits Baseline: 57, high risk adjusted 56 Goal status: INITIAL  2.  Autym will report left scapular and back pain consistently 0-3/10 on the numeric pain rating scale Baseline: 4/10 Goal status: INITIAL  3.  Improve bilateral hip flexors flexibility to 95 degrees and hamstrings to 50 degrees Baseline: 85 degrees and 35 degrees respectively Goal status: INITIAL  4.  Pascale will have improved postural and spine strength as assessed by pain and FOTO functional scores Baseline: See pain and functional scores  above Goal status: INITIAL  5.  Nidia will be independent with a long-term home exercise maintenance program at discharge Baseline: Started 03/07/2023 Goal status: INITIAL  PLAN:  PT FREQUENCY: 1-2x/week  PT DURATION: 8 weeks  PLANNED INTERVENTIONS: Therapeutic exercises, Therapeutic activity, Neuromuscular re-education, Balance training, Gait training, Patient/Family education, Self Care, Cryotherapy, and Manual therapy.  PLAN FOR NEXT SESSION: Review home exercise program, progress postural and body mechanics work, add hip hiking when appropriate.  When appropriate, scapular strengthening such as rows and shoulder external rotation with Thera-Band.   Cherlyn Cushing, PT, MPT 03/15/2023, 9:32 AM

## 2023-03-16 ENCOUNTER — Ambulatory Visit (INDEPENDENT_AMBULATORY_CARE_PROVIDER_SITE_OTHER): Payer: Medicare PPO

## 2023-03-16 DIAGNOSIS — L209 Atopic dermatitis, unspecified: Secondary | ICD-10-CM | POA: Diagnosis not present

## 2023-03-19 ENCOUNTER — Ambulatory Visit: Payer: Medicare PPO | Admitting: Rehabilitative and Restorative Service Providers"

## 2023-03-19 ENCOUNTER — Encounter: Payer: Self-pay | Admitting: Rehabilitative and Restorative Service Providers"

## 2023-03-19 DIAGNOSIS — M5459 Other low back pain: Secondary | ICD-10-CM

## 2023-03-19 DIAGNOSIS — R293 Abnormal posture: Secondary | ICD-10-CM | POA: Diagnosis not present

## 2023-03-19 DIAGNOSIS — M6281 Muscle weakness (generalized): Secondary | ICD-10-CM | POA: Diagnosis not present

## 2023-03-19 NOTE — Therapy (Signed)
OUTPATIENT PHYSICAL THERAPY THORACOLUMBAR TREATMENT  Referring diagnosis?  Diagnosis  M54.50 (ICD-10-CM) - Low back pain, unspecified back pain laterality, unspecified chronicity, unspecified whether sciatica present   Treatment diagnosis? R29.3   M62.81   M54.59 What was this (referring dx) caused by? []  Surgery []  Fall []  Ongoing issue [x]  Arthritis []  Other: ____________  Laterality: []  Rt [x]  Lt []  Both  Check all possible CPT codes:  *CHOOSE 10 OR LESS*    []  97110 (Therapeutic Exercise)  []  92507 (SLP Treatment)  []  97112 (Neuro Re-ed)   []  92526 (Swallowing Treatment)   []  97116 (Gait Training)   []  16109 (Cognitive Training, 1st 15 minutes) []  97140 (Manual Therapy)   []  97130 (Cognitive Training, each add'l 15 minutes)  []  97164 (Re-evaluation)                              []  Other, List CPT Code ____________  []  97530 (Therapeutic Activities)     []  97535 (Self Care)   [x]  All codes above (97110 - 97535)  []  97012 (Mechanical Traction)  []  97014 (E-stim Unattended)  []  97032 (E-stim manual)  []  97033 (Ionto)  []  97035 (Ultrasound) []  97750 (Physical Performance Training) []  [id number] (Aquatic Therapy) []  97016 (Vasopneumatic Device) []  [id number] (Paraffin) []  97034 (Contrast Bath) []  97597 (Wound Care 1st 20 sq cm) []  97598 (Wound Care each add'l 20 sq cm) []  97760 (Orthotic Fabrication, Fitting, Training Initial) []  [id number] (Prosthetic Management and Training Initial) []  [id number] (Orthotic or Prosthetic Training/ Modification Subsequent)   Patient Name: GLENDALY ROLLIE MRN: [id number] DOB:[birth year], 82 y.o., female Today's Date: 03/19/2023  END OF SESSION:  PT End of Session - 03/19/23 1151     Visit Number 4    Number of Visits 12    Date for PT Re-Evaluation 05/02/23    Authorization Type [employer]    Authorization - Visit Number 4    Authorization - Number of Visits 12    Progress Note Due on Visit 12    PT Start Time 1150    PT Stop Time 1230    PT Time  Calculation (min) 40 min    Activity Tolerance Patient tolerated treatment well;No increased pain    Behavior During Therapy WFL for tasks assessed/performed                Past Medical History:  Diagnosis Date   Angio-edema    Arthritis    Constipation    Contact lens/glasses fitting    wears contacts or glasses   Diverticular disease    Family history of cancer    Genetic testing 09/01/2016   Test results: 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) @ [facility]   Personal history of breast cancer 1998   left breast; estrogen positive   Past Surgical History:  Procedure Laterality Date   ABDOMINAL HYSTERECTOMY  2012   vag hystBSO   ANTERIOR AND POSTERIOR REPAIR  2012   bladder tape sliong-cysto along  with hyst   BREAST BIOPSY  1998   lt   BREAST LUMPECTOMY  1998   left   COLONOSCOPY     x3   LIPOMA EXCISION Left 02/06/2013   Procedure: EXCISION OF LEFT UPPER ARM LIPOMA;  Surgeon: Wayland Denis, DO;  Location: [facility];  Service: Plastics;  Laterality: Left;   TUBAL LIGATION     Patient Active Problem List   Diagnosis Date Noted   Allergic rhinitis 11/11/2021   Flexural atopic dermatitis 09/27/2021   Prurigo nodularis 09/27/2021   Contusion of toe 06/02/2020   Genetic testing 09/01/2016   Monoallelic mutation of CHEK2 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    Family history of cancer    Lipoma of arm 02/06/2013   Personal history of breast cancer 07/31/1996     PCP: Blair Heys, MD  REFERRING PROVIDER: Julieanne Cotton, PA-C  REFERRING DIAG:  Diagnosis  M54.50 (ICD-10-CM) - Low back pain, unspecified back pain laterality, unspecified chronicity, unspecified whether sciatica present    Rationale for Evaluation and Treatment: Rehabilitation  THERAPY DIAG:  Abnormal posture  Muscle weakness (generalized)  Other low back pain  ONSET DATE: Chronic, at least fall of 2023  SUBJECTIVE:                                                                                                                                                                                           SUBJECTIVE STATEMENT: Jany notes continued compliance with her supine home exercise program, although she is less consistent with standing activities.  Eval: Krimson had a "gel shot" in her left knee in the fall of 2023.  She noted her back (left scapular area) has been bothering her as well.  She knows she has a left convex scoliosis.  No complaints of peripheral pain or paresthesias.  She is active in [employer].  PERTINENT HISTORY:  Breast cancer, foot OA, HTN  PAIN:  Are you having pain? Yes: NPRS scale: 0-4/10 the past 7 days on a 10/10 Pain location: Left scapular Pain description: Ache, sharp, spasm Aggravating factors: Flexion (sweeping, bending, lifting) Relieving factors: Resting, tylenol  PRECAUTIONS: Back  RED FLAGS: None   WEIGHT BEARING RESTRICTIONS: No  FALLS:  Has patient fallen in last 6 months? No  LIVING ENVIRONMENT: Lives with: lives alone Lives in: House/apartment Stairs:  Does OK, usually uses elevator Has following equipment at home:  Has a cane that she uses occasionally  OCCUPATION: Retired  PLOF: Independent  PATIENT GOALS: Be able to do normal functional activities without left scapular pain  NEXT MD VISIT: MRI 03/27/2023 and Dr. August Saucer 04/04/2023  OBJECTIVE:  DIAGNOSTIC FINDINGS:  AP lateral radiographs lumbar spine  reviewed.  L4-5 spondylolisthesis is  present.  Scoliosis also present in the thoracolumbar region.  No acute  fractures.  Facet arthritis which is moderate also noted.   PATIENT SURVEYS:  FOTO 57 (risk adjusted 56,Goal 65 in 11 visits)  SCREENING FOR RED FLAGS: Bowel or bladder incontinence: No Spinal tumors: No Cauda equina syndrome: No Compression fracture: No Abdominal aneurysm: No  COGNITION: Overall cognitive status: Within functional limits for tasks assessed     SENSATION: No complaints of peripheral pain or paresthesias  MUSCLE LENGTH: Hamstrings: Right 35 deg; Left 35 deg  POSTURE: rounded shoulders, forward head, decreased lumbar lordosis, flexed trunk , and left convex scoliosis  LUMBAR ROM:   AROM 03/06/2022  Flexion   Extension 10  Right lateral flexion 30  Left lateral flexion 20  Right rotation   Left rotation    (Blank rows = not tested)  LOWER EXTREMITY ROM:     Passive  Left/Right 04/07/2023   Hip flexion 85/85   Hip extension    Hip abduction    Hip adduction    Hip internal rotation 7/10   Hip external rotation 34/34   Knee flexion    Knee extension    Ankle dorsiflexion    Ankle plantarflexion    Ankle inversion    Ankle eversion     (Blank rows = not tested)  STRENGTH:    MMT Left/Right 03/07/2023   Hip flexion    Hip extension    Hip abduction    Hip adduction    Hip internal rotation    Hip external rotation    Knee flexion    Knee extension    Ankle dorsiflexion    Ankle plantarflexion    Ankle inversion    Ankle eversion     (Blank rows = not tested)  GAIT: Distance walked: 100 feet Assistive device utilized:  None, although she does occasionally use a cane Level of assistance: Complete Independence Comments: Flexed posture noted with all activity  TODAY'S TREATMENT:                                                                                                                              DATE:  03/19/2023 Lumbar  extension (hips forward) 10 x 3 seconds Lumbar left lateral bending with left hand on left greater trochanter and right arm overhead 10 x 3 seconds Shoulder blade pinches 10 x 5 seconds Single knee to chest stretch (other leg straight) 4 x 20 seconds Modified Thomas stretch 4 x 20 seconds Hamstrings stretch 4 x 20 seconds Yoga bridge 10 x 5 seconds Heel to toe raises 10 x 3 seconds Hip hike 3 sets of 10 for 3 seconds  Functional Activities: Reviewed log roll technique for bed mobility and golfer's lift   03/15/2023 Lumbar extension (hips forward) 10 x 3 seconds Lumbar left lateral bending with left hand on left greater trochanter  and right arm overhead 10 x 3 seconds Shoulder blade pinches 10 x 5 seconds Single knee to chest stretch (other leg straight) 4 x 20 seconds Modified Thomas stretch 4 x 20 seconds Hamstrings stretch 5 x 20 seconds Yoga bridge 10 x 5 seconds Heel to toe raises 10 x 3 seconds  Functional Activities: Reviewed postural basics and log roll technique for bed mobility   03/09/2023 Lumbar extension (hips forward) 10 x 3 seconds Lumbar left lateral bending with left hand on left greater trochanter and right arm overhead 10 x 3 seconds Shoulder blade pinches 10 x 5 seconds Single knee to chest stretch (other leg straight) 4 x 20 seconds Modified Thomas stretch 4 x 20 seconds Attempted hamstrings stretch (stopped by cramping) Yoga bridge 10 x 3 seconds Heel to toe raises 10 x 3 seconds  Functional Activities: Reviewed postural basics and log roll technique for bed mobility   PATIENT EDUCATION:  Education details: See above Person educated: Patient Education method: Explanation, Demonstration, Tactile cues, Verbal cues, and Handouts Education comprehension: verbalized understanding, returned demonstration, verbal cues required, tactile cues required, and needs further education  HOME EXERCISE PROGRAM: [id number]  ASSESSMENT:  CLINICAL IMPRESSION: Taydem  reports good compliance with supine activities, although standing activities have not been completed as often.  I encouraged her to set her alarm on her phone to go off every hour and asked her to do 2-3 reps of all standing exercises every hour or 2 to get compliance up to the level of her other exercises.  This should help with postural correction and pain.  Correcting Dianna's flexed posture remains a major focus of her physical therapy.  Continue to address posture, flexibility and strength to meet long-term goals.  OBJECTIVE IMPAIRMENTS: Abnormal gait, decreased activity tolerance, decreased endurance, decreased knowledge of condition, difficulty walking, decreased ROM, decreased strength, decreased safety awareness, impaired flexibility, improper body mechanics, postural dysfunction, and pain.   ACTIVITY LIMITATIONS: carrying, lifting, bending, standing, squatting, and locomotion level  PARTICIPATION LIMITATIONS: community activity  PERSONAL FACTORS: Breast cancer, foot OA, HTN are also affecting patient's functional outcome.   REHAB POTENTIAL: Good  CLINICAL DECISION MAKING: Stable/uncomplicated  EVALUATION COMPLEXITY: Low   GOALS: Goals reviewed with patient? Yes  SHORT TERM GOALS: Target date: 04/04/2023  Kenadi will be independent with her day 1 home exercise program Baseline: Started 03/07/2023 Goal status: On Going 03/19/2023  2.  Jahya will have an improved postural awareness and will be able to implement this into static and dynamic activities Baseline: Started education 03/07/2023 Goal status: On Going 03/19/2023  3.  Marleta will be able to reduce left scapular pain with postural correction and therapeutic exercises Baseline: Unable at evaluation Goal status: INITIAL   LONG TERM GOALS: Target date: 05/02/2023  Improve FOTO to 65 in 11 visits Baseline: 57, high risk adjusted 56 Goal status: INITIAL  2.  Jenaiya will report left scapular and back pain consistently 0-3/10 on  the numeric pain rating scale Baseline: 4/10 Goal status: INITIAL  3.  Improve bilateral hip flexors flexibility to 95 degrees and hamstrings to 50 degrees Baseline: 85 degrees and 35 degrees respectively Goal status: INITIAL  4.  Patia will have improved postural and spine strength as assessed by pain and FOTO functional scores Baseline: See pain and functional scores above Goal status: INITIAL  5.  Calisha will be independent with a long-term home exercise maintenance program at discharge Baseline: Started 03/07/2023 Goal status: INITIAL  PLAN:  PT FREQUENCY: 1-2x/week  PT  DURATION: 8 weeks  PLANNED INTERVENTIONS: Therapeutic exercises, Therapeutic activity, Neuromuscular re-education, Balance training, Gait training, Patient/Family education, Self Care, Cryotherapy, and Manual therapy.  PLAN FOR NEXT SESSION: Review home exercise program, progress postural and body mechanics work.  When appropriate, scapular strengthening such as rows and shoulder external rotation with Thera-Band.  Reassess standing and supine AROM and flexibility measures.   Cherlyn Cushing, PT, MPT 03/19/2023, 2:33 PM

## 2023-03-20 DIAGNOSIS — H26491 Other secondary cataract, right eye: Secondary | ICD-10-CM | POA: Diagnosis not present

## 2023-03-21 ENCOUNTER — Encounter: Payer: Self-pay | Admitting: Rehabilitative and Restorative Service Providers"

## 2023-03-21 ENCOUNTER — Ambulatory Visit: Payer: Medicare PPO | Admitting: Rehabilitative and Restorative Service Providers"

## 2023-03-21 DIAGNOSIS — M6281 Muscle weakness (generalized): Secondary | ICD-10-CM | POA: Diagnosis not present

## 2023-03-21 DIAGNOSIS — M5459 Other low back pain: Secondary | ICD-10-CM | POA: Diagnosis not present

## 2023-03-21 DIAGNOSIS — R293 Abnormal posture: Secondary | ICD-10-CM | POA: Diagnosis not present

## 2023-03-21 NOTE — Therapy (Signed)
OUTPATIENT PHYSICAL THERAPY THORACOLUMBAR TREATMENT  Referring diagnosis?  Diagnosis  M54.50 (ICD-10-CM) - Low back pain, unspecified back pain laterality, unspecified chronicity, unspecified whether sciatica present   Treatment diagnosis? R29.3   M62.81   M54.59 What was this (referring dx) caused by? []  Surgery []  Fall []  Ongoing issue [x]  Arthritis []  Other: ____________  Laterality: []  Rt [x]  Lt []  Both  Check all possible CPT codes:  *CHOOSE 10 OR LESS*    []  97110 (Therapeutic Exercise)  []  92507 (SLP Treatment)  []  97112 (Neuro Re-ed)   []  92526 (Swallowing Treatment)   []  97116 (Gait Training)   []  [id number] (Cognitive Training, 1st 15 minutes) []  97140 (Manual Therapy)   []  97130 (Cognitive Training, each add'l 15 minutes)  []  97164 (Re-evaluation)                              []  Other, List CPT Code ____________  []  97530 (Therapeutic Activities)     []  97535 (Self Care)   [x]  All codes above (97110 - 97535)  []  97012 (Mechanical Traction)  []  97014 (E-stim Unattended)  []  97032 (E-stim manual)  []  97033 (Ionto)  []  97035 (Ultrasound) []  97750 (Physical Performance Training) []  [id number] (Aquatic Therapy) []  97016 (Vasopneumatic Device) []  [id number] (Paraffin) []  97034 (Contrast Bath) []  97597 (Wound Care 1st 20 sq cm) []  97598 (Wound Care each add'l 20 sq cm) []  97760 (Orthotic Fabrication, Fitting, Training Initial) []  [id number] (Prosthetic Management and Training Initial) []  [id number] (Orthotic or Prosthetic Training/ Modification Subsequent)   Patient Name: REBBA BRISTOW MRN: [id number] DOB:[birth year], 82 y.o., female Today's Date: 03/21/2023  END OF SESSION:  PT End of Session - 03/21/23 1432     Visit Number 5    Number of Visits 12    Date for PT Re-Evaluation 05/02/23    Authorization Type [employer]    Authorization - Visit Number 5    Authorization - Number of Visits 12    Progress Note Due on Visit 12    PT Start Time 1430    PT Stop Time 1513    PT Time  Calculation (min) 43 min    Activity Tolerance Patient tolerated treatment well;No increased pain    Behavior During Therapy WFL for tasks assessed/performed                 Past Medical History:  Diagnosis Date   Angio-edema    Arthritis    Constipation    Contact lens/glasses fitting    wears contacts or glasses   Diverticular disease    Family history of cancer    Genetic testing 09/01/2016   Test results: 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) @ [facility]   Personal history of breast cancer 1998   left breast; estrogen positive   Past Surgical History:  Procedure Laterality Date   ABDOMINAL HYSTERECTOMY  2012   vag hystBSO   ANTERIOR AND POSTERIOR REPAIR  2012   bladder tape sliong-cysto  along with hyst   BREAST BIOPSY  1998   lt   BREAST LUMPECTOMY  1998   left   COLONOSCOPY     x3   LIPOMA EXCISION Left 02/06/2013   Procedure: EXCISION OF LEFT UPPER ARM LIPOMA;  Surgeon: Wayland Denis, DO;  Location: [facility];  Service: Plastics;  Laterality: Left;   TUBAL LIGATION     Patient Active Problem List   Diagnosis Date Noted   Allergic rhinitis 11/11/2021   Flexural atopic dermatitis 09/27/2021   Prurigo nodularis 09/27/2021   Contusion of toe 06/02/2020   Genetic testing 09/01/2016   Monoallelic mutation of CHEK2 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    Family history of cancer    Lipoma of arm 02/06/2013   Personal history of breast cancer 07/31/1996     PCP: Blair Heys, MD  REFERRING PROVIDER: Julieanne Cotton, PA-C  REFERRING DIAG:  Diagnosis  M54.50 (ICD-10-CM) - Low back pain, unspecified back pain laterality, unspecified chronicity, unspecified whether sciatica present    Rationale for Evaluation and Treatment: Rehabilitation  THERAPY DIAG:  Abnormal posture  Muscle weakness (generalized)  Other low back pain  ONSET DATE: Chronic, at least fall of 2023  SUBJECTIVE:                                                                                                                                                                                           SUBJECTIVE STATEMENT: Bradee notes continued compliance with her supine home exercise program, although she is less consistent with standing activities.  Eval: Temitope had a "gel shot" in her left knee in the fall of 2023.  She noted her back (left scapular area) has been bothering her as well.  She knows she has a left convex scoliosis.  No complaints of peripheral pain or paresthesias.  She is active in [employer].  PERTINENT HISTORY:  Breast cancer, foot OA, HTN  PAIN:  Are you having pain? Yes: NPRS scale: 0-4/10 the past 7 days on a 10/10 Pain location: Left scapular Pain description: Ache, sharp, spasm Aggravating factors: Flexion (sweeping, bending, lifting) Relieving factors: Resting, tylenol  PRECAUTIONS: Back  RED FLAGS: None   WEIGHT BEARING RESTRICTIONS: No  FALLS:  Has patient fallen in last 6 months? No  LIVING ENVIRONMENT: Lives with: lives alone Lives in: House/apartment Stairs:  Does OK, usually uses elevator Has following equipment at home:  Has a cane that she uses occasionally  OCCUPATION: Retired  PLOF: Independent  PATIENT GOALS: Be able to do normal functional activities without left scapular pain  NEXT MD VISIT: MRI 03/27/2023 and Dr. August Saucer 04/04/2023  OBJECTIVE:   DIAGNOSTIC FINDINGS:  AP lateral radiographs lumbar spine  reviewed.  L4-5 spondylolisthesis is  present.  Scoliosis also present in the thoracolumbar region.  No acute  fractures.  Facet arthritis which is moderate also noted.   PATIENT SURVEYS:  FOTO 57 (risk adjusted 56,Goal 65 in 11 visits)  SCREENING FOR RED FLAGS: Bowel or bladder incontinence: No Spinal tumors: No Cauda equina syndrome: No Compression fracture: No Abdominal aneurysm: No  COGNITION: Overall cognitive status: Within functional limits for tasks assessed     SENSATION: No complaints of peripheral pain or paresthesias  MUSCLE LENGTH: Hamstrings: Right 35 deg; Left 35 deg  POSTURE: rounded shoulders, forward head, decreased lumbar lordosis, flexed trunk , and left convex scoliosis  LUMBAR ROM:   AROM 03/06/2022 03/21/2023  Flexion    Extension 10 10  Right lateral flexion 30 30  Left lateral flexion 20 30  Right rotation    Left rotation     (Blank rows = not tested)  LOWER EXTREMITY ROM:     Passive  Left/Right 04/07/2023   Hip flexion 85/85   Hip extension    Hip abduction    Hip adduction    Hip internal rotation 7/10   Hip external rotation 34/34   Knee flexion    Knee extension    Ankle dorsiflexion    Ankle plantarflexion    Ankle inversion    Ankle eversion     (Blank rows = not tested)  STRENGTH:    MMT Left/Right 03/07/2023   Hip flexion    Hip extension    Hip abduction    Hip adduction    Hip internal rotation    Hip external rotation    Knee flexion    Knee extension    Ankle dorsiflexion    Ankle plantarflexion    Ankle inversion    Ankle eversion     (Blank rows = not tested)  GAIT: Distance walked: 100 feet Assistive device utilized:  None, although she does occasionally use a cane Level of assistance: Complete Independence Comments: Flexed posture noted with all activity  TODAY'S TREATMENT:                                                                                                                              DATE:   03/21/2023 Lumbar extension (hips forward) 2 sets of 10 x 3 seconds Lumbar left lateral bending with left hand on left greater trochanter and right arm overhead 10 x 3 seconds Shoulder blade pinches 10 x 5 seconds Single knee to chest stretch (other leg straight) 4 x 20 seconds Modified Thomas stretch 4 x 20 seconds Hamstrings stretch 4 x 20 seconds Yoga bridge 10 x 5 seconds Heel to toe raises 10 x 3 seconds Hip hike 2 sets of 10 for 3 seconds  Functional Activities: Reviewed log roll technique for bed mobility and golfer's lift again   03/19/2023 Lumbar extension (hips forward) 10  x 3 seconds Lumbar left lateral bending with left hand on left greater trochanter and right arm overhead 10 x 3 seconds Shoulder blade pinches 10 x 5 seconds Single knee to chest stretch (other leg straight) 4 x 20 seconds Modified Thomas stretch 4 x 20 seconds Hamstrings stretch 4 x 20 seconds Yoga bridge 10 x 5 seconds Heel to toe raises 10 x 3 seconds Hip hike 3 sets of 10 for 3 seconds  Functional Activities: Reviewed log roll technique for bed mobility and golfer's lift   03/15/2023 Lumbar extension (hips forward) 10 x 3 seconds Lumbar left lateral bending with left hand on left greater trochanter and right arm overhead 10 x 3 seconds Shoulder blade pinches 10 x 5 seconds Single knee to chest stretch (other leg straight) 4 x 20 seconds Modified Thomas stretch 4 x 20 seconds Hamstrings stretch 5 x 20 seconds Yoga bridge 10 x 5 seconds Heel to toe raises 10 x 3 seconds  Functional Activities: Reviewed postural basics and log roll technique for bed mobility   PATIENT EDUCATION:  Education details: See above Person educated: Patient Education method: Explanation, Demonstration, Tactile cues, Verbal cues, and Handouts Education comprehension: verbalized understanding, returned demonstration, verbal cues required, tactile cues required, and needs further education  HOME EXERCISE  PROGRAM: [id number]  ASSESSMENT:  CLINICAL IMPRESSION: [place] continues to do a good job with her home exercises and she required much less correction today.  I reinforced my recommendation for her to set her alarm on her phone to go off every hour and asked her to do 2-3 reps of all standing exercises every hour or 2 to get compliance up to the level of her other exercises.  This should continue to help with postural correction and pain.  Correcting Dianna's flexed posture remains the major focus of her physical therapy.  Continue to address posture, flexibility and strength to meet long-term goals.    OBJECTIVE IMPAIRMENTS: Abnormal gait, decreased activity tolerance, decreased endurance, decreased knowledge of condition, difficulty walking, decreased ROM, decreased strength, decreased safety awareness, impaired flexibility, improper body mechanics, postural dysfunction, and pain.   ACTIVITY LIMITATIONS: carrying, lifting, bending, standing, squatting, and locomotion level  PARTICIPATION LIMITATIONS: community activity  PERSONAL FACTORS: Breast cancer, foot OA, HTN are also affecting patient's functional outcome.   REHAB POTENTIAL: Good  CLINICAL DECISION MAKING: Stable/uncomplicated  EVALUATION COMPLEXITY: Low   GOALS: Goals reviewed with patient? Yes  SHORT TERM GOALS: Target date: 04/04/2023  Kennedy will be independent with her day 1 home exercise program Baseline: Started 03/07/2023 Goal status: Met 03/21/2023  2.  Wendelyn will have an improved postural awareness and will be able to implement this into static and dynamic activities Baseline: Started education 03/07/2023 Goal status: Partially Met 03/21/2023  3.  Janalyn will be able to reduce left scapular pain with postural correction and therapeutic exercises Baseline: Unable at evaluation Goal status: INITIAL   LONG TERM GOALS: Target date: 05/02/2023  Improve FOTO to 65 in 11 visits Baseline: 57, high risk adjusted 56 Goal  status: INITIAL  2.  Aaralyn will report left scapular and back pain consistently 0-3/10 on the numeric pain rating scale Baseline: 4/10 Goal status: Ongoing 03/21/2023  3.  Improve bilateral hip flexors flexibility to 95 degrees and hamstrings to 50 degrees Baseline: 85 degrees and 35 degrees respectively Goal status: INITIAL  4.  Jerris will have improved postural and spine strength as assessed by pain and FOTO functional scores Baseline: See pain and functional scores above Goal  status: INITIAL  5.  Laycie will be independent with a long-term home exercise maintenance program at discharge Baseline: Started 03/07/2023 Goal status: INITIAL  PLAN:  PT FREQUENCY: 1-2x/week  PT DURATION: 8 weeks  PLANNED INTERVENTIONS: Therapeutic exercises, Therapeutic activity, Neuromuscular re-education, Balance training, Gait training, Patient/Family education, Self Care, Cryotherapy, and Manual therapy.  PLAN FOR NEXT SESSION: Review home exercise program, progress postural and body mechanics work.  Okay to go ahead and add scapular strengthening such as rows and shoulder external rotation with Thera-Band.  Consider hip hike in the door frame and perhaps prone strength progressions.  Reassess supine AROM and flexibility measures.  FOTO and progress note for her follow-up with Dr. August Saucer.   Cherlyn Cushing, PT, MPT 03/21/2023, 3:19 PM

## 2023-03-27 ENCOUNTER — Ambulatory Visit
Admission: RE | Admit: 2023-03-27 | Discharge: 2023-03-27 | Disposition: A | Discharge status: Home / Self Care | Payer: Medicare PPO | Source: Ambulatory Visit | Attending: Surgical | Admitting: Surgical

## 2023-03-27 DIAGNOSIS — M5126 Other intervertebral disc displacement, lumbar region: Secondary | ICD-10-CM | POA: Diagnosis not present

## 2023-03-27 DIAGNOSIS — M545 Low back pain, unspecified: Secondary | ICD-10-CM

## 2023-03-27 DIAGNOSIS — M4316 Spondylolisthesis, lumbar region: Secondary | ICD-10-CM | POA: Diagnosis not present

## 2023-03-29 ENCOUNTER — Encounter: Payer: Medicare PPO | Admitting: Rehabilitative and Restorative Service Providers"

## 2023-03-30 ENCOUNTER — Ambulatory Visit (INDEPENDENT_AMBULATORY_CARE_PROVIDER_SITE_OTHER): Payer: Medicare PPO

## 2023-03-30 ENCOUNTER — Ambulatory Visit: Payer: Medicare PPO | Admitting: Rehabilitative and Restorative Service Providers"

## 2023-03-30 ENCOUNTER — Encounter: Payer: Self-pay | Admitting: Rehabilitative and Restorative Service Providers"

## 2023-03-30 DIAGNOSIS — M6281 Muscle weakness (generalized): Secondary | ICD-10-CM

## 2023-03-30 DIAGNOSIS — R293 Abnormal posture: Secondary | ICD-10-CM | POA: Diagnosis not present

## 2023-03-30 DIAGNOSIS — L209 Atopic dermatitis, unspecified: Secondary | ICD-10-CM

## 2023-03-30 DIAGNOSIS — M5459 Other low back pain: Secondary | ICD-10-CM | POA: Diagnosis not present

## 2023-03-30 NOTE — Therapy (Signed)
OUTPATIENT PHYSICAL THERAPY THORACOLUMBAR TREATMENT  Referring diagnosis?  Diagnosis  M54.50 (ICD-10-CM) - Low back pain, unspecified back pain laterality, unspecified chronicity, unspecified whether sciatica present   Treatment diagnosis? R29.3   M62.81   M54.59 What was this (referring dx) caused by? []  Surgery []  Fall []  Ongoing issue [x]  Arthritis []  Other: ____________  Laterality: []  Rt [x]  Lt []  Both  Check all possible CPT codes:  *CHOOSE 10 OR LESS*    []  97110 (Therapeutic Exercise)  []  92507 (SLP Treatment)  []  97112 (Neuro Re-ed)   []  92526 (Swallowing Treatment)   []  97116 (Gait Training)   []  [id number] (Cognitive Training, 1st 15 minutes) []  97140 (Manual Therapy)   []  97130 (Cognitive Training, each add'l 15 minutes)  []  97164 (Re-evaluation)                              []  Other, List CPT Code ____________  []  47829 (Therapeutic Activities)     []  97535 (Self Care)   [x]  All codes above (97110 - 97535)  []  97012 (Mechanical Traction)  []  97014 (E-stim Unattended)  []  97032 (E-stim manual)  []  97033 (Ionto)  []  97035 (Ultrasound) []  97750 (Physical Performance Training) []  [id number] (Aquatic Therapy) []  97016 (Vasopneumatic Device) []  [id number] (Paraffin) []  97034 (Contrast Bath) []  97597 (Wound Care 1st 20 sq cm) []  97598 (Wound Care each add'l 20 sq cm) []  97760 (Orthotic Fabrication, Fitting, Training Initial) []  [id number] (Prosthetic Management and Training Initial) []  [id number] (Orthotic or Prosthetic Training/ Modification Subsequent)   Patient Name: CRISEL OLTMAN MRN: [id number] DOB:12/24/1940, 82 y.o., female Today's Date: 03/30/2023  END OF SESSION:  PT End of Session - 03/30/23 1427     Visit Number 6    Number of Visits 12    Date for PT Re-Evaluation 05/02/23    Authorization Type [employer]    Authorization - Visit Number 6    Authorization - Number of Visits 12    Progress Note Due on Visit 12    PT Start Time 1427    PT Stop Time 1513    PT Time  Calculation (min) 46 min    Activity Tolerance Patient tolerated treatment well;No increased pain    Behavior During Therapy [facility] for tasks assessed/performed            Progress Note Reporting Period 03/07/2023 to 03/30/2023  See note below for Objective Data and Assessment of Progress/Goals.     Past Medical History:  Diagnosis Date   Angio-edema    Arthritis    Constipation    Contact lens/glasses fitting    wears contacts or glasses   Diverticular disease    Family history of cancer    Genetic testing 09/01/2016   Test results: 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) @ [facility]   Personal history of breast cancer 1998   left breast; estrogen positive   Past Surgical History:  Procedure Laterality Date   ABDOMINAL HYSTERECTOMY  2012  vag hystBSO   ANTERIOR AND POSTERIOR REPAIR  2012   bladder tape sliong-cysto along with hyst   BREAST BIOPSY  1998   lt   BREAST LUMPECTOMY  1998   left   COLONOSCOPY     x3   LIPOMA EXCISION Left 02/06/2013   Procedure: EXCISION OF LEFT UPPER ARM LIPOMA;  Surgeon: Wayland Denis, DO;  Location: [facility];  Service: Plastics;  Laterality: Left;   TUBAL LIGATION     Patient Active Problem List   Diagnosis Date Noted   Allergic rhinitis 11/11/2021   Flexural atopic dermatitis 09/27/2021   Prurigo nodularis 09/27/2021   Contusion of toe 06/02/2020   Genetic testing 09/01/2016   Monoallelic mutation of CHEK2 gene  in female patient    Family history of cancer    Lipoma of arm 02/06/2013   Personal history of breast cancer 07/31/1996    PCP: Blair Heys, MD  REFERRING PROVIDER: Julieanne Cotton, PA-C  REFERRING DIAG:  Diagnosis  M54.50 (ICD-10-CM) - Low back pain, unspecified back pain laterality, unspecified chronicity, unspecified whether sciatica present    Rationale for Evaluation and Treatment: Rehabilitation  THERAPY DIAG:  Abnormal posture  Muscle weakness (generalized)  Other low back pain  ONSET DATE: Chronic, at least fall of 2023  SUBJECTIVE:                                                                                                                                                                                           SUBJECTIVE STATEMENT: Janise notes more consistency with standing activities.  She notes being a bit beat-up after a long day of house organizingand admittedly bad body mechanics.  Eval: Allondra had a "gel shot" in her left knee in the fall of 2023.  She noted her back (left scapular area) has been bothering her as well.  She knows she has a left convex scoliosis.  No complaints of peripheral pain or paresthesias.  She is active in [employer].  PERTINENT HISTORY:  Breast cancer, foot OA, HTN  PAIN:  Are you having pain? Yes: NPRS scale: 0-4/10 the past 7 days on a 10/10 Pain location: Left scapular Pain description: Ache, sharp, spasm Aggravating factors: Flexion (sweeping, bending, lifting) Relieving factors: Resting, tylenol, postural correction  PRECAUTIONS: Back  RED FLAGS: None   WEIGHT BEARING RESTRICTIONS: No  FALLS:  Has patient fallen in last 6 months? No  LIVING ENVIRONMENT: Lives with: lives alone Lives in: House/apartment Stairs:  Does OK, usually uses elevator Has following equipment at home:  Has a cane that she uses occasionally  OCCUPATION: Retired  PLOF: Independent  PATIENT GOALS: Be able to do normal  functional activities without left scapular pain  NEXT MD VISIT: MRI 03/27/2023 and Dr. August Saucer 04/04/2023  OBJECTIVE:   DIAGNOSTIC FINDINGS:  AP lateral radiographs lumbar spine reviewed.  L4-5 spondylolisthesis is  present.  Scoliosis also present in the thoracolumbar region.  No acute  fractures.  Facet arthritis which is moderate also noted.   PATIENT SURVEYS:  03/30/2023: FOTO 69 (Goal met)  Eval: FOTO 57 (risk adjusted 56,Goal 65 in 11 visits)  SCREENING FOR RED FLAGS: Bowel or bladder incontinence: No Spinal tumors: No Cauda equina syndrome: No Compression fracture: No Abdominal aneurysm: No  COGNITION: Overall cognitive status: Within functional limits for tasks assessed     SENSATION: No complaints of peripheral pain or paresthesias  MUSCLE LENGTH: 03/30/2023: Hamstrings: Right 40 deg; Left 40 deg  Eval: Hamstrings: Right 35 deg; Left 35 deg  POSTURE: rounded shoulders, forward head, decreased lumbar lordosis, flexed trunk , and left convex scoliosis  LUMBAR ROM:   AROM 03/06/2022 03/21/2023 03/30/2023  Flexion     Extension 10 10 10   Right lateral flexion 30 30 30   Left lateral flexion 20 30 30   Right rotation     Left rotation      (Blank rows = not tested)  LOWER EXTREMITY ROM:     Passive  Left/Right 04/07/2023 Left/Right 03/30/2023  Hip flexion 85/85 95/100  Hip extension    Hip abduction    Hip adduction    Hip internal rotation 7/10 8/15  Hip external rotation 34/34 36/28  Knee flexion    Knee extension    Ankle dorsiflexion    Ankle plantarflexion    Ankle inversion    Ankle eversion     (Blank rows = not tested)  STRENGTH:    MMT Left/Right 03/07/2023 Left/Right 03/30/2023  Hip flexion    Hip extension    Hip abduction    Hip adduction    Hip internal rotation    Hip external rotation    Knee flexion    Knee extension    Ankle dorsiflexion    Ankle plantarflexion    Ankle inversion    Ankle eversion     (Blank rows = not  tested)  GAIT: Distance walked: 100 feet Assistive device utilized:  None, although she does occasionally use a cane Level of assistance: Complete Independence Comments: Flexed posture noted with all activity  TODAY'S TREATMENT:                                                                                                                              DATE:  03/30/2023 Lumbar extension (hips forward) 10 x 3 seconds Lumbar left lateral bending with left hand on left greater trochanter and right arm overhead 10 x 3 seconds Shoulder blade pinches 10 x 5 seconds Single knee to chest stretch (other leg straight) 4 x 20 seconds Modified Thomas stretch 4 x 20 seconds Hamstrings stretch 4 x  20 seconds Yoga bridge 10 x 5 seconds Heel to toe raises 10 x 3 seconds Hip hike 2 sets of 10 for 3 seconds  Functional Activities: Reviewed log roll technique and golfer's lift, reviewed progress note findings   03/21/2023 Lumbar extension (hips forward) 2 sets of 10 x 3 seconds Lumbar left lateral bending with left hand on left greater trochanter and right arm overhead 10 x 3 seconds Shoulder blade pinches 10 x 5 seconds Single knee to chest stretch (other leg straight) 4 x 20 seconds Modified Thomas stretch 4 x 20 seconds Hamstrings stretch 4 x 20 seconds Yoga bridge 10 x 5 seconds Heel to toe raises 10 x 3 seconds Hip hike 2 sets of 10 for 3 seconds  Functional Activities: Reviewed log roll technique for bed mobility and golfer's lift again   03/19/2023 Lumbar extension (hips forward) 10 x 3 seconds Lumbar left lateral bending with left hand on left greater trochanter and right arm overhead 10 x 3 seconds Shoulder blade pinches 10 x 5 seconds Single knee to chest stretch (other leg straight) 4 x 20 seconds Modified Thomas stretch 4 x 20 seconds Hamstrings stretch 4 x 20 seconds Yoga bridge 10 x 5 seconds Heel to toe raises 10 x 3 seconds Hip hike 3 sets of 10 for 3 seconds  Functional  Activities: Reviewed log roll technique for bed mobility and golfer's lift   PATIENT EDUCATION:  Education details: See above Person educated: Patient Education method: Explanation, Demonstration, Tactile cues, Verbal cues, and Handouts Education comprehension: verbalized understanding, returned demonstration, verbal cues required, tactile cues required, and needs further education  HOME EXERCISE PROGRAM: [id number]  ASSESSMENT:  CLINICAL IMPRESSION: Rossi is making progress towards long-term goals.  She has met her FOTO functional goal, although some feedback is still needed for exercise and postural correction.  I do have a few scapular and postural strengthening progressions I am going to implement on her next visit which should help continue to decrease her pain so she may meet all long-term goals within the recommended plan of care.  Her prognosis remains good to meet these long-term goals with the recommended treatment.  OBJECTIVE IMPAIRMENTS: Abnormal gait, decreased activity tolerance, decreased endurance, decreased knowledge of condition, difficulty walking, decreased ROM, decreased strength, decreased safety awareness, impaired flexibility, improper body mechanics, postural dysfunction, and pain.   ACTIVITY LIMITATIONS: carrying, lifting, bending, standing, squatting, and locomotion level  PARTICIPATION LIMITATIONS: community activity  PERSONAL FACTORS: Breast cancer, foot OA, HTN are also affecting patient's functional outcome.   REHAB POTENTIAL: Good  CLINICAL DECISION MAKING: Stable/uncomplicated  EVALUATION COMPLEXITY: Low   GOALS: Goals reviewed with patient? Yes  SHORT TERM GOALS: Target date: 04/04/2023  Dedra will be independent with her day 1 home exercise program Baseline: Started 03/07/2023 Goal status: Met 03/21/2023  2.  Weslee will have an improved postural awareness and will be able to implement this into static and dynamic activities Baseline: Started  education 03/07/2023 Goal status: Partially Met 03/21/2023  3.  Leanah will be able to reduce left scapular pain with postural correction and therapeutic exercises Baseline: Unable at evaluation Goal status: Partially met 03/30/2023   LONG TERM GOALS: Target date: 05/02/2023  Improve FOTO to 65 in 11 visits Baseline: 57, high risk adjusted 56 Goal status: Met 03/30/2023  2.  [place] will report left scapular and back pain consistently 0-3/10 on the numeric pain rating scale Baseline: 4/10 Goal status: Ongoing 03/30/2023  3.  Improve bilateral hip flexors flexibility to 95  degrees and hamstrings to 50 degrees Baseline: 85 degrees and 35 degrees respectively Goal status: Partially met 03/30/2023  4.  Syndee will have improved postural and spine strength as assessed by pain and FOTO functional scores Baseline: See pain and functional scores above Goal status: Partially met 03/30/2023  5.  Shaunette will be independent with a long-term home exercise maintenance program at discharge Baseline: Started 03/07/2023 Goal status: Ongoing 03/30/2023  PLAN:  PT FREQUENCY: 1-2x/week  PT DURATION: 8 weeks total, 5 additional weeks from today  PLANNED INTERVENTIONS: Therapeutic exercises, Therapeutic activity, Neuromuscular re-education, Balance training, Gait training, Patient/Family education, Self Care, Cryotherapy, and Manual therapy.  PLAN FOR NEXT SESSION: Progress postural and body mechanics work.  Okay to go ahead and add scapular strengthening such as rows and shoulder external rotation with Thera-Band.  Consider hip hike in the door frame and perhaps prone strength progressions.    Cherlyn Cushing, PT, MPT 03/30/2023, 4:21 PM

## 2023-04-04 ENCOUNTER — Ambulatory Visit: Payer: Medicare PPO | Admitting: Orthopedic Surgery

## 2023-04-04 ENCOUNTER — Ambulatory Visit: Payer: Medicare PPO | Admitting: Rehabilitative and Restorative Service Providers"

## 2023-04-04 ENCOUNTER — Encounter: Payer: Self-pay | Admitting: Orthopedic Surgery

## 2023-04-04 ENCOUNTER — Encounter: Payer: Self-pay | Admitting: Rehabilitative and Restorative Service Providers"

## 2023-04-04 DIAGNOSIS — M1712 Unilateral primary osteoarthritis, left knee: Secondary | ICD-10-CM

## 2023-04-04 DIAGNOSIS — M545 Low back pain, unspecified: Secondary | ICD-10-CM | POA: Diagnosis not present

## 2023-04-04 DIAGNOSIS — M6281 Muscle weakness (generalized): Secondary | ICD-10-CM

## 2023-04-04 DIAGNOSIS — M5459 Other low back pain: Secondary | ICD-10-CM

## 2023-04-04 DIAGNOSIS — R293 Abnormal posture: Secondary | ICD-10-CM | POA: Diagnosis not present

## 2023-04-04 NOTE — Progress Notes (Unsigned)
Office Visit Note   Patient: Elaine Carter           Date of Birth: 03-30-1941           MRN: [id number] Visit Date: 04/04/2023 Requested by: Blair Heys, MD [address] [place],  [place] [address] PCP: Blair Heys, MD  Subjective: Chief Complaint  Patient presents with   Other    Scan review    HPI: Elaine Carter is a 82 y.o. female who presents to the office reporting low back pain.  Since she was last seen she has had an MRI scan.  This does show severe narrowing and stenosis at L4-5.  She has scoliosis as well.  Overall she states her back is better.  This is something she can live with.  Does not really want to consider surgical intervention at this point in her life particularly considering the multiple back surgeries her husband had..                ROS: All systems reviewed are negative as they relate to the chief complaint within the history of present illness.  Patient denies fevers or chills.  Assessment & Plan: Visit Diagnoses:  1. Arthritis of left knee   2. Low back pain, unspecified back pain laterality, unspecified chronicity, unspecified whether sciatica present     Plan: Impression is fairly significant stenosis in the lumbar spine.  Could consider ESI versus surgical decompression.  I do not think she is really at the point where she wants to do either of those.  Discussed how progression of symptoms would look like decreased walking endurance.  She will follow-up as needed.  Follow-Up Instructions: No follow-ups on file.   Orders:  No orders of the defined types were placed in this encounter.  No orders of the defined types were placed in this encounter.     Procedures: No procedures performed   Clinical Data: No additional findings.  Objective: Vital Signs: There were no vitals taken for this visit.  Physical Exam:  Constitutional: Patient appears well-developed HEENT:  Head: Normocephalic Eyes:EOM are normal Neck:  Normal range of motion Cardiovascular: Normal rate Pulmonary/chest: Effort normal Neurologic: Patient is alert Skin: Skin is warm Psychiatric: Patient has normal mood and affect  Ortho Exam: Ortho exam demonstrates 5 out of 5 ankle dorsiflexion plantarflexion quad and hamstring strength with no groin pain with internal/external rotation of either leg.  No masses lymphadenopathy or skin changes noted in the shoulder girdle region.  She has mild pain with forward lateral bending but no trochanteric tenderness.  No muscle atrophy in either leg.  Gait is normal.  Specialty Comments:  No specialty comments available.  Imaging: No results found.   PMFS History: Patient Active Problem List   Diagnosis Date Noted   Allergic rhinitis 11/11/2021   Flexural atopic dermatitis 09/27/2021   Prurigo nodularis 09/27/2021   Contusion of toe 06/02/2020   Genetic testing 09/01/2016   Monoallelic mutation of CHEK2 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    Family history of cancer    Lipoma of arm 02/06/2013   Personal history of breast cancer 07/31/1996   Past Medical History:  Diagnosis Date   Angio-edema    Arthritis    Constipation    Contact lens/glasses fitting    wears contacts or glasses   Diverticular disease    Family history of cancer    Genetic testing 09/01/2016   Test results: Pathogenic mutation in the CHEK2 gene  called c.591delA (p.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 type   Cancer Maternal Uncle        throat ca (smoker); deceased 61s   Leukemia Cousin        mat female 1st cousin; deceased 32   Breast cancer Other        mat grandmother's sister    Past Surgical History:  Procedure Laterality Date   ABDOMINAL HYSTERECTOMY  2012   vag hystBSO   ANTERIOR AND POSTERIOR REPAIR  2012   bladder tape sliong-cysto along with hyst   BREAST BIOPSY  1998   lt   BREAST LUMPECTOMY  1998   left   COLONOSCOPY     x3   LIPOMA EXCISION Left 02/06/2013   Procedure: EXCISION OF LEFT UPPER ARM LIPOMA;  Surgeon: Wayland Denis, DO;  Location: [facility];  Service: Plastics;  Laterality: Left;   TUBAL LIGATION     Social History   Occupational History   Not on file  Tobacco Use   Smoking status: Former    Current packs/day: 0.00    Types: Cigarettes    Quit date: 02/04/1999    Years since quitting: 24.1   Smokeless tobacco: Never  Substance and Sexual Activity   Alcohol use: Yes    Comment: 2 wines daily   Drug use: No   Sexual activity: Not on file

## 2023-04-04 NOTE — Therapy (Signed)
OUTPATIENT PHYSICAL THERAPY THORACOLUMBAR TREATMENT  Referring diagnosis?  Diagnosis  M54.50 (ICD-10-CM) - Low back pain, unspecified back pain laterality, unspecified chronicity, unspecified whether sciatica present   Treatment diagnosis? R29.3   M62.81   M54.59 What was this (referring dx) caused by? []  Surgery []  Fall []  Ongoing issue [x]  Arthritis []  Other: ____________  Laterality: []  Rt [x]  Lt []  Both  Check all possible CPT codes:  *CHOOSE 10 OR LESS*    []  97110 (Therapeutic Exercise)  []  92507 (SLP Treatment)  []  45409 (Neuro Re-ed)   []  92526 (Swallowing Treatment)   []  97116 (Gait Training)   []  [id number] (Cognitive Training, 1st 15 minutes) []  97140 (Manual Therapy)   []  97130 (Cognitive Training, each add'l 15 minutes)  []  81191 (Re-evaluation)                              []  Other, List CPT Code ____________  []  97530 (Therapeutic Activities)     []  97535 (Self Care)   [x]  All codes above (97110 - 97535)  []  97012 (Mechanical Traction)  []  97014 (E-stim Unattended)  []  97032 (E-stim manual)  []  97033 (Ionto)  []  97035 (Ultrasound) []  97750 (Physical Performance Training) []  [id number] (Aquatic Therapy) []  97016 (Vasopneumatic Device) []  [id number] (Paraffin) []  97034 (Contrast Bath) []  97597 (Wound Care 1st 20 sq cm) []  97598 (Wound Care each add'l 20 sq cm) []  97760 (Orthotic Fabrication, Fitting, Training Initial) []  [id number] (Prosthetic Management and Training Initial) []  [id number] (Orthotic or Prosthetic Training/ Modification Subsequent)   Patient Name: Elaine Carter MRN: [id number] DOB:1940-12-28, 82 y.o., female Today's Date: 04/04/2023  END OF SESSION:  PT End of Session - 04/04/23 1550     Visit Number 7    Number of Visits 12    Date for PT Re-Evaluation 05/02/23    Authorization Type [employer]    Authorization - Visit Number 7    Authorization - Number of Visits 12    Progress Note Due on Visit 12    PT Start Time 1520    PT Stop Time 1602    PT Time  Calculation (min) 42 min    Activity Tolerance Patient tolerated treatment well;No increased pain    Behavior During Therapy WFL for tasks assessed/performed              Past Medical History:  Diagnosis Date   Angio-edema    Arthritis    Constipation    Contact lens/glasses fitting    wears contacts or glasses   Diverticular disease    Family history of cancer    Genetic testing 09/01/2016   Test results: 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) @ [facility]   Personal history of breast cancer 1998   left breast; estrogen positive   Past Surgical History:  Procedure Laterality Date   ABDOMINAL HYSTERECTOMY  2012   vag hystBSO   ANTERIOR AND POSTERIOR REPAIR  2012   bladder tape sliong-cysto along with hyst  BREAST BIOPSY  1998   lt   BREAST LUMPECTOMY  1998   left   COLONOSCOPY     x3   LIPOMA EXCISION Left 02/06/2013   Procedure: EXCISION OF LEFT UPPER ARM LIPOMA;  Surgeon: Wayland Denis, DO;  Location: [facility];  Service: Plastics;  Laterality: Left;   TUBAL LIGATION     Patient Active Problem List   Diagnosis Date Noted   Allergic rhinitis 11/11/2021   Flexural atopic dermatitis 09/27/2021   Prurigo nodularis 09/27/2021   Contusion of toe 06/02/2020   Genetic testing 09/01/2016   Monoallelic mutation of CHEK2 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    Family history of cancer    Lipoma of arm 02/06/2013   Personal history of breast cancer 07/31/1996     PCP: Blair Heys, MD  REFERRING PROVIDER: Julieanne Cotton, PA-C  REFERRING DIAG:  Diagnosis  M54.50 (ICD-10-CM) - Low back pain, unspecified back pain laterality, unspecified chronicity, unspecified whether sciatica present    Rationale for Evaluation and Treatment: Rehabilitation  THERAPY DIAG:  Abnormal posture  Muscle weakness (generalized)  Other low back pain  ONSET DATE: Chronic, at least fall of 2023  SUBJECTIVE:                                                                                                                                                                                           SUBJECTIVE STATEMENT: Elaine Carter notes feeling better, maintaining good HEP consistency and more attention with her body mechanics, although this still needs work.  Eval: Elaine Carter had a "gel shot" in her left knee in the fall of 2023.  She noted her back (left scapular area) has been bothering her as well.  She knows she has a left convex scoliosis.  No complaints of peripheral pain or paresthesias.  She is active in [employer].  PERTINENT HISTORY:  Breast cancer, foot OA, HTN  PAIN:  Are you having pain? Yes: NPRS scale: Although she is taking less medication, 0-4/10 the past 7 days on a 10/10 Pain location: Left scapular Pain description: Ache, sharp, spasm Aggravating factors: Flexion (sweeping, bending, lifting) Relieving factors: Resting, tylenol, postural correction  PRECAUTIONS: Back  RED FLAGS: None   WEIGHT BEARING RESTRICTIONS: No  FALLS:  Has patient fallen in last 6 months? No  LIVING ENVIRONMENT: Lives with: lives alone Lives in: House/apartment Stairs:  Does OK, usually uses elevator Has following equipment at home:  Has a cane that she uses occasionally  OCCUPATION: Retired  PLOF: Independent  PATIENT GOALS: Be able to do normal functional activities without left scapular pain  NEXT MD VISIT: MRI 03/27/2023  and Dr. August Saucer  04/04/2023  OBJECTIVE:   DIAGNOSTIC FINDINGS:  AP lateral radiographs lumbar spine reviewed.  L4-5 spondylolisthesis is  present.  Scoliosis also present in the thoracolumbar region.  No acute  fractures.  Facet arthritis which is moderate also noted.   PATIENT SURVEYS:  03/30/2023: FOTO 69 (Goal met)  Eval: FOTO 57 (risk adjusted 56,Goal 65 in 11 visits)  SCREENING FOR RED FLAGS: Bowel or bladder incontinence: No Spinal tumors: No Cauda equina syndrome: No Compression fracture: No Abdominal aneurysm: No  COGNITION: Overall cognitive status: Within functional limits for tasks assessed     SENSATION: No complaints of peripheral pain or paresthesias  MUSCLE LENGTH: 03/30/2023: Hamstrings: Right 40 deg; Left 40 deg  Eval: Hamstrings: Right 35 deg; Left 35 deg  POSTURE: rounded shoulders, forward head, decreased lumbar lordosis, flexed trunk , and left convex scoliosis  LUMBAR ROM:   AROM 03/06/2022 03/21/2023 03/30/2023  Flexion     Extension 10 10 10   Right lateral flexion 30 30 30   Left lateral flexion 20 30 30   Right rotation     Left rotation      (Blank rows = not tested)  LOWER EXTREMITY ROM:     Passive  Left/Right 04/07/2023 Left/Right 03/30/2023  Hip flexion 85/85 95/100  Hip extension    Hip abduction    Hip adduction    Hip internal rotation 7/10 8/15  Hip external rotation 34/34 36/28  Knee flexion    Knee extension    Ankle dorsiflexion    Ankle plantarflexion    Ankle inversion    Ankle eversion     (Blank rows = not tested)  STRENGTH:    MMT Left/Right 03/07/2023 Left/Right 03/30/2023  Hip flexion    Hip extension    Hip abduction    Hip adduction    Hip internal rotation    Hip external rotation    Knee flexion    Knee extension    Ankle dorsiflexion    Ankle plantarflexion    Ankle inversion    Ankle eversion     (Blank rows = not tested)  GAIT: Distance walked: 100 feet Assistive device utilized:  None, although she does occasionally  use a cane Level of assistance: Complete Independence Comments: Flexed posture noted with all activity  TODAY'S TREATMENT:                                                                                                                              DATE:  04/04/2023 -Lumbar extension (hips forward) 5 x 3 seconds -Lumbar left lateral bending with left hand on left greater trochanter and right arm overhead 5 x 3 seconds -Shoulder blade pinches 5 x 5 seconds Single knee to chest stretch (other leg straight) 2 x 20 seconds Modified Thomas stretch 2 x 20 seconds Hamstrings stretch 2 x 20 seconds Yoga bridge 10 x 5 seconds Hip hike 10 for 3 seconds Rows with Blue Thera-band 20  x 3 seconds Shoulder ER Green Thera-band 10 x 3 seconds  Functional Activities: Reviewed log roll technique and golfer's lift   03/30/2023 Lumbar extension (hips forward) 10 x 3 seconds Lumbar left lateral bending with left hand on left greater trochanter and right arm overhead 10 x 3 seconds Shoulder blade pinches 10 x 5 seconds Single knee to chest stretch (other leg straight) 4 x 20 seconds Modified Thomas stretch 4 x 20 seconds Hamstrings stretch 4 x 20 seconds Yoga bridge 10 x 5 seconds Heel to toe raises 10 x 3 seconds Hip hike 2 sets of 10 for 3 seconds  Functional Activities: Reviewed log roll technique and golfer's lift, reviewed progress note findings   03/21/2023 Lumbar extension (hips forward) 2 sets of 10 x 3 seconds Lumbar left lateral bending with left hand on left greater trochanter and right arm overhead 10 x 3 seconds Shoulder blade pinches 10 x 5 seconds Single knee to chest stretch (other leg straight) 4 x 20 seconds Modified Thomas stretch 4 x 20 seconds Hamstrings stretch 4 x 20 seconds Yoga bridge 10 x 5 seconds Heel to toe raises 10 x 3 seconds Hip hike 2 sets of 10 for 3 seconds  Functional Activities: Reviewed log roll technique for bed mobility and golfer's lift  again    PATIENT EDUCATION:  Education details: See above Person educated: Patient Education method: Programmer, multimedia, Demonstration, Tactile cues, Verbal cues, and Handouts Education comprehension: verbalized understanding, returned demonstration, verbal cues required, tactile cues required, and needs further education  HOME EXERCISE PROGRAM: [id number]  ASSESSMENT:  CLINICAL IMPRESSION: Elaine Carter notes continued progress with her exercises over the past week.  Improving posture and body mechanics awareness along with continued strength gains will benefit Elaine Carter and help her meet all long-term goals established at evaluation.  OBJECTIVE IMPAIRMENTS: Abnormal gait, decreased activity tolerance, decreased endurance, decreased knowledge of condition, difficulty walking, decreased ROM, decreased strength, decreased safety awareness, impaired flexibility, improper body mechanics, postural dysfunction, and pain.   ACTIVITY LIMITATIONS: carrying, lifting, bending, standing, squatting, and locomotion level  PARTICIPATION LIMITATIONS: community activity  PERSONAL FACTORS: Breast cancer, foot OA, HTN are also affecting patient's functional outcome.   REHAB POTENTIAL: Good  CLINICAL DECISION MAKING: Stable/uncomplicated  EVALUATION COMPLEXITY: Low   GOALS: Goals reviewed with patient? Yes  SHORT TERM GOALS: Target date: 04/04/2023  Elaine Carter will be independent with her day 1 home exercise program Baseline: Started 03/07/2023 Goal status: Met 03/21/2023  2.  Elaine Carter will have an improved postural awareness and will be able to implement this into static and dynamic activities Baseline: Started education 03/07/2023 Goal status: Partially Met 04/04/2023  3.  Elaine Carter will be able to reduce left scapular pain with postural correction and therapeutic exercises Baseline: Unable at evaluation Goal status: Partially met 04/04/2023   LONG TERM GOALS: Target date: 05/02/2023  Improve FOTO to 65 in 11  visits Baseline: 57, high risk adjusted 56 Goal status: Met 03/30/2023  2.  Elaine Carter will report left scapular and back pain consistently 0-3/10 on the numeric pain rating scale Baseline: 4/10 Goal status: Ongoing 04/04/2023  3.  Improve bilateral hip flexors flexibility to 95 degrees and hamstrings to 50 degrees Baseline: 85 degrees and 35 degrees respectively Goal status: Partially met 03/30/2023  4.  Elaine Carter will have improved postural and spine strength as assessed by pain and FOTO functional scores Baseline: See pain and functional scores above Goal status: Partially met 03/30/2023  5.  Elaine Carter will be independent with a long-term home exercise  maintenance program at discharge Baseline: Started 03/07/2023 Goal status: Ongoing 04/04/2023  PLAN:  PT FREQUENCY: 1-2x/week  PT DURATION: 8 weeks total, 5 additional weeks from today  PLANNED INTERVENTIONS: Therapeutic exercises, Therapeutic activity, Neuromuscular re-education, Balance training, Gait training, Patient/Family education, Self Care, Cryotherapy, and Manual therapy.  PLAN FOR NEXT SESSION: Progress postural work and Agricultural consultant.  Consider hip hike in the door frame and perhaps prone strength progressions.    Cherlyn Cushing, PT, MPT 04/04/2023, 5:02 PM

## 2023-04-06 ENCOUNTER — Ambulatory Visit: Payer: Medicare PPO | Admitting: Rehabilitative and Restorative Service Providers"

## 2023-04-06 ENCOUNTER — Encounter: Payer: Self-pay | Admitting: Rehabilitative and Restorative Service Providers"

## 2023-04-06 DIAGNOSIS — M6281 Muscle weakness (generalized): Secondary | ICD-10-CM

## 2023-04-06 DIAGNOSIS — R293 Abnormal posture: Secondary | ICD-10-CM | POA: Diagnosis not present

## 2023-04-06 DIAGNOSIS — M5459 Other low back pain: Secondary | ICD-10-CM | POA: Diagnosis not present

## 2023-04-06 NOTE — Therapy (Signed)
OUTPATIENT PHYSICAL THERAPY THORACOLUMBAR TREATMENT  Referring diagnosis?  Diagnosis  M54.50 (ICD-10-CM) - Low back pain, unspecified back pain laterality, unspecified chronicity, unspecified whether sciatica present   Treatment diagnosis? R29.3   M62.81   M54.59 What was this (referring dx) caused by? []  Surgery []  Fall []  Ongoing issue [x]  Arthritis []  Other: ____________  Laterality: []  Rt [x]  Lt []  Both  Check all possible CPT codes:  *CHOOSE 10 OR LESS*    []  97110 (Therapeutic Exercise)  []  09811 (SLP Treatment)  []  97112 (Neuro Re-ed)   []  92526 (Swallowing Treatment)   []  97116 (Gait Training)   []  [id number] (Cognitive Training, 1st 15 minutes) []  97140 (Manual Therapy)   []  97130 (Cognitive Training, each add'l 15 minutes)  []  97164 (Re-evaluation)                              []  Other, List CPT Code ____________  []  97530 (Therapeutic Activities)     []  97535 (Self Care)   [x]  All codes above (97110 - 97535)  []  97012 (Mechanical Traction)  []  97014 (E-stim Unattended)  []  97032 (E-stim manual)  []  97033 (Ionto)  []  97035 (Ultrasound) []  97750 (Physical Performance Training) []  [id number] (Aquatic Therapy) []  97016 (Vasopneumatic Device) []  [id number] (Paraffin) []  97034 (Contrast Bath) []  97597 (Wound Care 1st 20 sq cm) []  97598 (Wound Care each add'l 20 sq cm) []  97760 (Orthotic Fabrication, Fitting, Training Initial) []  [id number] (Prosthetic Management and Training Initial) []  [id number] (Orthotic or Prosthetic Training/ Modification Subsequent)   Patient Name: Elaine Carter MRN: [id number] DOB:1941-06-25, 82 y.o., female Today's Date: 04/06/2023  END OF SESSION:  PT End of Session - 04/06/23 1258     Visit Number 8    Number of Visits 12    Date for PT Re-Evaluation 05/02/23    Authorization Type [employer]    Authorization - Visit Number 8    Authorization - Number of Visits 12    Progress Note Due on Visit 12    PT Start Time 1258    PT Stop Time 1342    PT Time  Calculation (min) 44 min    Activity Tolerance Patient tolerated treatment well;No increased pain    Behavior During Therapy WFL for tasks assessed/performed               Past Medical History:  Diagnosis Date   Angio-edema    Arthritis    Constipation    Contact lens/glasses fitting    wears contacts or glasses   Diverticular disease    Family history of cancer    Genetic testing 09/01/2016   Test results: 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) @ [facility]   Personal history of breast cancer 1998   left breast; estrogen positive   Past Surgical History:  Procedure Laterality Date   ABDOMINAL HYSTERECTOMY  2012   vag hystBSO   ANTERIOR AND POSTERIOR REPAIR  2012   bladder tape sliong-cysto along with  hyst   BREAST BIOPSY  1998   lt   BREAST LUMPECTOMY  1998   left   COLONOSCOPY     x3   LIPOMA EXCISION Left 02/06/2013   Procedure: EXCISION OF LEFT UPPER ARM LIPOMA;  Surgeon: Wayland Denis, DO;  Location: [facility];  Service: Plastics;  Laterality: Left;   TUBAL LIGATION     Patient Active Problem List   Diagnosis Date Noted   Allergic rhinitis 11/11/2021   Flexural atopic dermatitis 09/27/2021   Prurigo nodularis 09/27/2021   Contusion of toe 06/02/2020   Genetic testing 09/01/2016   Monoallelic mutation of CHEK2 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    Family history of cancer    Lipoma of arm 02/06/2013   Personal history of breast cancer 07/31/1996     PCP: Blair Heys, MD  REFERRING PROVIDER: Julieanne Cotton, PA-C  REFERRING DIAG:  Diagnosis  M54.50 (ICD-10-CM) - Low back pain, unspecified back pain laterality, unspecified chronicity, unspecified whether sciatica present    Rationale for Evaluation and Treatment: Rehabilitation  THERAPY DIAG:  Abnormal posture  Muscle weakness (generalized)  Other low back pain  ONSET DATE: Chronic, at least fall of 2023  SUBJECTIVE:                                                                                                                                                                                           SUBJECTIVE STATEMENT: Elaine Carter notes progress week to week.  She is maintaining good HEP consistency and continues to pay careful attention with her body mechanics, although this still needs professional feedback.  Eval: Elaine Carter had a "gel shot" in her left knee in the fall of 2023.  She noted her back (left scapular area) has been bothering her as well.  She knows she has a left convex scoliosis.  No complaints of peripheral pain or paresthesias.  She is active in [employer].  PERTINENT HISTORY:  Breast cancer, foot OA, HTN  PAIN:  Are you having pain? Yes: NPRS scale: Although she is taking less medication, 0-4/10 the past 7 days on a 10/10 Pain location: Left scapular Pain description: Ache, sharp, spasm Aggravating factors: Flexion (sweeping, bending, lifting) Relieving factors: Resting, tylenol, postural correction  PRECAUTIONS: Back  RED FLAGS: None   WEIGHT BEARING RESTRICTIONS: No  FALLS:  Has patient fallen in last 6 months? No  LIVING ENVIRONMENT: Lives with: lives alone Lives in: House/apartment Stairs:  Does OK, usually uses elevator Has following equipment at home:  Has a cane that she uses occasionally  OCCUPATION: Retired  PLOF: Independent  PATIENT GOALS: Be able to do normal  functional activities without left scapular pain  NEXT MD  VISIT: MRI 03/27/2023 and Dr. August Saucer 04/04/2023  OBJECTIVE:   DIAGNOSTIC FINDINGS:  AP lateral radiographs lumbar spine reviewed.  L4-5 spondylolisthesis is  present.  Scoliosis also present in the thoracolumbar region.  No acute  fractures.  Facet arthritis which is moderate also noted.   PATIENT SURVEYS:  03/30/2023: FOTO 69 (Goal met)  Eval: FOTO 57 (risk adjusted 56,Goal 65 in 11 visits)  SCREENING FOR RED FLAGS: Bowel or bladder incontinence: No Spinal tumors: No Cauda equina syndrome: No Compression fracture: No Abdominal aneurysm: No  COGNITION: Overall cognitive status: Within functional limits for tasks assessed     SENSATION: No complaints of peripheral pain or paresthesias  MUSCLE LENGTH: 03/30/2023: Hamstrings: Right 40 deg; Left 40 deg  Eval: Hamstrings: Right 35 deg; Left 35 deg  POSTURE: rounded shoulders, forward head, decreased lumbar lordosis, flexed trunk , and left convex scoliosis  LUMBAR ROM:   AROM 03/06/2022 03/21/2023 03/30/2023  Flexion     Extension 10 10 10   Right lateral flexion 30 30 30   Left lateral flexion 20 30 30   Right rotation     Left rotation      (Blank rows = not tested)  LOWER EXTREMITY ROM:     Passive  Left/Right 04/07/2023 Left/Right 03/30/2023  Hip flexion 85/85 95/100  Hip extension    Hip abduction    Hip adduction    Hip internal rotation 7/10 8/15  Hip external rotation 34/34 36/28  Knee flexion    Knee extension    Ankle dorsiflexion    Ankle plantarflexion    Ankle inversion    Ankle eversion     (Blank rows = not tested)  STRENGTH:    MMT Left/Right 03/07/2023 Left/Right 04/05/09  Hip flexion    Hip extension    Hip abduction    Hip adduction    Hip internal rotation    Hip external rotation    Knee flexion    Knee extension    Ankle dorsiflexion    Ankle plantarflexion    Ankle inversion    Ankle eversion    Cervical extension strength  18.5   (Blank rows = not tested)  GAIT: Distance walked: 100  feet Assistive device utilized:  None, although she does occasionally use a cane Level of assistance: Complete Independence Comments: Flexed posture noted with all activity  TODAY'S TREATMENT:                                                                                                                              DATE:  04/06/2023 Lumbar extension (hips forward) 5 x 3 seconds Lumbar left lateral bending with left hand on left greater trochanter and right arm overhead 5 x 3 seconds Shoulder blade pinches 5 x 5 seconds Single knee to chest stretch (other leg straight) 4 x 20 seconds Modified Thomas stretch 4 x 20 seconds Hamstrings stretch 4 x 20  seconds Yoga bridge 10 x 5 seconds Hip hike 10 for 3 seconds Rows with Blue Thera-band 20 x 3 seconds Shoulder ER Green Thera-band 10 x 3 seconds Cervical extension Isometrics 10 x 5 seconds   04/04/2023 -Lumbar extension (hips forward) 5 x 3 seconds -Lumbar left lateral bending with left hand on left greater trochanter and right arm overhead 5 x 3 seconds -Shoulder blade pinches 5 x 5 seconds Single knee to chest stretch (other leg straight) 2 x 20 seconds Modified Thomas stretch 2 x 20 seconds Hamstrings stretch 2 x 20 seconds Yoga bridge 10 x 5 seconds Hip hike 10 for 3 seconds Rows with Blue Thera-band 20 x 3 seconds Shoulder ER Green Thera-band 10 x 3 seconds  Functional Activities: Reviewed log roll technique and golfer's lift   03/30/2023 Lumbar extension (hips forward) 10 x 3 seconds Lumbar left lateral bending with left hand on left greater trochanter and right arm overhead 10 x 3 seconds Shoulder blade pinches 10 x 5 seconds Single knee to chest stretch (other leg straight) 4 x 20 seconds Modified Thomas stretch 4 x 20 seconds Hamstrings stretch 4 x 20 seconds Yoga bridge 10 x 5 seconds Heel to toe raises 10 x 3 seconds Hip hike 2 sets of 10 for 3 seconds  Functional Activities: Reviewed log roll technique and golfer's  lift, reviewed progress note findings  PATIENT EDUCATION:  Education details: See above Person educated: Patient Education method: Programmer, multimedia, Demonstration, Tactile cues, Verbal cues, and Handouts Education comprehension: verbalized understanding, returned demonstration, verbal cues required, tactile cues required, and needs further education  HOME EXERCISE PROGRAM: [id number]  ASSESSMENT:  CLINICAL IMPRESSION: Deadra notes she has been able to cut back on the frequency of tylenol use secondary to progress with her posture and strength.  Posture and body mechanics awareness along with continued strength gains will prepare Ladora for more independent rehabilitation.  OBJECTIVE IMPAIRMENTS: Abnormal gait, decreased activity tolerance, decreased endurance, decreased knowledge of condition, difficulty walking, decreased ROM, decreased strength, decreased safety awareness, impaired flexibility, improper body mechanics, postural dysfunction, and pain.   ACTIVITY LIMITATIONS: carrying, lifting, bending, standing, squatting, and locomotion level  PARTICIPATION LIMITATIONS: community activity  PERSONAL FACTORS: Breast cancer, foot OA, HTN are also affecting patient's functional outcome.   REHAB POTENTIAL: Good  CLINICAL DECISION MAKING: Stable/uncomplicated  EVALUATION COMPLEXITY: Low   GOALS: Goals reviewed with patient? Yes  SHORT TERM GOALS: Target date: 04/04/2023  Rhemi will be independent with her day 1 home exercise program Baseline: Started 03/07/2023 Goal status: Met 03/21/2023  2.  Jaedynn will have an improved postural awareness and will be able to implement this into static and dynamic activities Baseline: Started education 03/07/2023 Goal status: Partially Met 04/04/2023  3.  Dilpreet will be able to reduce left scapular pain with postural correction and therapeutic exercises Baseline: Unable at evaluation Goal status: Partially met 04/04/2023   LONG TERM GOALS: Target date:  05/02/2023  Improve FOTO to 65 in 11 visits Baseline: 57, high risk adjusted 56 Goal status: Met 03/30/2023  2.  Bionka will report left scapular and back pain consistently 0-3/10 on the numeric pain rating scale Baseline: 4/10 Goal status: Ongoing 04/04/2023  3.  Improve bilateral hip flexors flexibility to 95 degrees and hamstrings to 50 degrees Baseline: 85 degrees and 35 degrees respectively Goal status: Partially met 03/30/2023  4.  Jenaye will have improved postural and spine strength as assessed by pain and FOTO functional scores Baseline: See pain and functional scores above Goal  status: Partially met 03/30/2023  5.  Destanee will be independent with a long-term home exercise maintenance program at discharge Baseline: Started 03/07/2023 Goal status: Ongoing 04/04/2023  PLAN:  PT FREQUENCY: 1-2x/week  PT DURATION: 8 weeks total, 4-5 weeks from today   PLANNED INTERVENTIONS: Therapeutic exercises, Therapeutic activity, Neuromuscular re-education, Balance training, Gait training, Patient/Family education, Self Care, Cryotherapy, and Manual therapy.  PLAN FOR NEXT SESSION: Progress postural work and Agricultural consultant.  Consider hip hike in the door frame and perhaps prone strength progressions.  Has she been able to cut back further on her Tylenol use?  Cherlyn Cushing, PT, MPT 04/06/2023, 4:19 PM

## 2023-04-11 ENCOUNTER — Encounter: Payer: Medicare PPO | Admitting: Rehabilitative and Restorative Service Providers"

## 2023-04-13 ENCOUNTER — Encounter: Payer: Self-pay | Admitting: Rehabilitative and Restorative Service Providers"

## 2023-04-13 ENCOUNTER — Ambulatory Visit (INDEPENDENT_AMBULATORY_CARE_PROVIDER_SITE_OTHER): Payer: Medicare PPO | Admitting: *Deleted

## 2023-04-13 ENCOUNTER — Ambulatory Visit: Payer: Medicare PPO | Admitting: Rehabilitative and Restorative Service Providers"

## 2023-04-13 DIAGNOSIS — R293 Abnormal posture: Secondary | ICD-10-CM

## 2023-04-13 DIAGNOSIS — M5459 Other low back pain: Secondary | ICD-10-CM | POA: Diagnosis not present

## 2023-04-13 DIAGNOSIS — M6281 Muscle weakness (generalized): Secondary | ICD-10-CM | POA: Diagnosis not present

## 2023-04-13 DIAGNOSIS — L209 Atopic dermatitis, unspecified: Secondary | ICD-10-CM | POA: Diagnosis not present

## 2023-04-13 NOTE — Therapy (Signed)
OUTPATIENT PHYSICAL THERAPY THORACOLUMBAR TREATMENT  Referring diagnosis?  Diagnosis  M54.50 (ICD-10-CM) - Low back pain, unspecified back pain laterality, unspecified chronicity, unspecified whether sciatica present   Treatment diagnosis? R29.3   M62.81   M54.59 What was this (referring dx) caused by? []  Surgery []  Fall []  Ongoing issue [x]  Arthritis []  Other: ____________  Laterality: []  Rt [x]  Lt []  Both  Check all possible CPT codes:  *CHOOSE 10 OR LESS*    []  40981 (Therapeutic Exercise)  []  92507 (SLP Treatment)  []  97112 (Neuro Re-ed)   []  92526 (Swallowing Treatment)   []  97116 (Gait Training)   []  [id number] (Cognitive Training, 1st 15 minutes) []  97140 (Manual Therapy)   []  97130 (Cognitive Training, each add'l 15 minutes)  []  97164 (Re-evaluation)                              []  Other, List CPT Code ____________  []  97530 (Therapeutic Activities)     []  97535 (Self Care)   [x]  All codes above (97110 - 97535)  []  97012 (Mechanical Traction)  []  97014 (E-stim Unattended)  []  97032 (E-stim manual)  []  97033 (Ionto)  []  97035 (Ultrasound) []  97750 (Physical Performance Training) []  [id number] (Aquatic Therapy) []  97016 (Vasopneumatic Device) []  [id number] (Paraffin) []  97034 (Contrast Bath) []  97597 (Wound Care 1st 20 sq cm) []  97598 (Wound Care each add'l 20 sq cm) []  97760 (Orthotic Fabrication, Fitting, Training Initial) []  [id number] (Prosthetic Management and Training Initial) []  [id number] (Orthotic or Prosthetic Training/ Modification Subsequent)   Patient Name: Elaine Carter MRN: [id number] DOB:08-03-1940, 82 y.o., female Today's Date: 04/13/2023  END OF SESSION:  PT End of Session - 04/13/23 1300     Visit Number 9    Number of Visits 12    Date for PT Re-Evaluation 05/02/23    Authorization Type [employer]    Authorization - Visit Number 9    Authorization - Number of Visits 12    Progress Note Due on Visit 12    PT Start Time 1259    PT Stop Time 1340    PT Time  Calculation (min) 41 min    Activity Tolerance Patient tolerated treatment well;No increased pain    Behavior During Therapy WFL for tasks assessed/performed                Past Medical History:  Diagnosis Date   Angio-edema    Arthritis    Constipation    Contact lens/glasses fitting    wears contacts or glasses   Diverticular disease    Family history of cancer    Genetic testing 09/01/2016   Test results: 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) @ [facility]   Personal history of breast cancer 1998   left breast; estrogen positive   Past Surgical History:  Procedure Laterality Date   ABDOMINAL HYSTERECTOMY  2012   vag hystBSO   ANTERIOR AND POSTERIOR REPAIR  2012   bladder tape sliong-cysto along  with hyst   BREAST BIOPSY  1998   lt   BREAST LUMPECTOMY  1998   left   COLONOSCOPY     x3   LIPOMA EXCISION Left 02/06/2013   Procedure: EXCISION OF LEFT UPPER ARM LIPOMA;  Surgeon: Wayland Denis, DO;  Location: [facility];  Service: Plastics;  Laterality: Left;   TUBAL LIGATION     Patient Active Problem List   Diagnosis Date Noted   Allergic rhinitis 11/11/2021   Flexural atopic dermatitis 09/27/2021   Prurigo nodularis 09/27/2021   Contusion of toe 06/02/2020   Genetic testing 09/01/2016   Monoallelic mutation of CHEK2 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    Family history of cancer    Lipoma of arm 02/06/2013   Personal history of breast cancer 07/31/1996     PCP: Blair Heys, MD  REFERRING PROVIDER: Julieanne Cotton, PA-C  REFERRING DIAG:  Diagnosis  M54.50 (ICD-10-CM) - Low back pain, unspecified back pain laterality, unspecified chronicity, unspecified whether sciatica present    Rationale for Evaluation and Treatment: Rehabilitation  THERAPY DIAG:  Abnormal posture  Muscle weakness (generalized)  Other low back pain  ONSET DATE: Chronic, at least fall of 2023  SUBJECTIVE:                                                                                                                                                                                           SUBJECTIVE STATEMENT: Elaine Carter is using less and less tylenol.  She continues to note progress week to week.  She continues good HEP consistency and continues to pay careful attention with her body mechanics, although this still needs professional feedback.  Eval: Elaine Carter had a "gel shot" in her left knee in the fall of 2023.  She noted her back (left scapular area) has been bothering her as well.  She knows she has a left convex scoliosis.  No complaints of peripheral pain or paresthesias.  She is active in [employer].  PERTINENT HISTORY:  Breast cancer, foot OA, HTN  PAIN:  Are you having pain? Yes: NPRS scale: Although she is taking less medication, 0-4/10 the past 7 days on a 10/10 Pain location: Left scapular Pain description: Ache, sharp, spasm Aggravating factors: Flexion (sweeping, bending, lifting) Relieving factors: Resting, tylenol, postural correction  PRECAUTIONS: Back  RED FLAGS: None   WEIGHT BEARING RESTRICTIONS: No  FALLS:  Has patient fallen in last 6 months? No  LIVING ENVIRONMENT: Lives with: lives alone Lives in: House/apartment Stairs:  Does OK, usually uses elevator Has following equipment at home:  Has a cane that she uses occasionally  OCCUPATION: Retired  PLOF: Independent  PATIENT GOALS: Be able to do normal functional  activities without left scapular pain  NEXT MD VISIT: MRI 03/27/2023 and Dr. August Saucer 04/04/2023  OBJECTIVE:   DIAGNOSTIC FINDINGS:  AP lateral radiographs lumbar spine reviewed.  L4-5 spondylolisthesis is  present.  Scoliosis also present in the thoracolumbar region.  No acute  fractures.  Facet arthritis which is moderate also noted.   PATIENT SURVEYS:  03/30/2023: FOTO 69 (Goal met)  Eval: FOTO 57 (risk adjusted 56,Goal 65 in 11 visits)  SCREENING FOR RED FLAGS: Bowel or bladder incontinence: No Spinal tumors: No Cauda equina syndrome: No Compression fracture: No Abdominal aneurysm: No  COGNITION: Overall cognitive status: Within functional limits for tasks assessed     SENSATION: No complaints of peripheral pain or paresthesias  MUSCLE LENGTH: 03/30/2023: Hamstrings: Right 40 deg; Left 40 deg  Eval: Hamstrings: Right 35 deg; Left 35 deg  POSTURE: rounded shoulders, forward head, decreased lumbar lordosis, flexed trunk , and left convex scoliosis  LUMBAR ROM:   AROM 03/06/2022 03/21/2023 03/30/2023  Flexion     Extension 10 10 10   Right lateral flexion 30 30 30   Left lateral flexion 20 30 30   Right rotation     Left rotation      (Blank rows = not tested)  LOWER EXTREMITY ROM:     Passive  Left/Right 04/07/2023 Left/Right 03/30/2023  Hip flexion 85/85 95/100  Hip extension    Hip abduction    Hip adduction    Hip internal rotation 7/10 8/15  Hip external rotation 34/34 36/28  Knee flexion    Knee extension    Ankle dorsiflexion    Ankle plantarflexion    Ankle inversion    Ankle eversion     (Blank rows = not tested)  STRENGTH:    MMT Left/Right 03/07/2023 Left/Right 04/05/09  Hip flexion    Hip extension    Hip abduction    Hip adduction    Hip internal rotation    Hip external rotation    Knee flexion    Knee extension    Ankle dorsiflexion    Ankle plantarflexion    Ankle inversion    Ankle eversion    Cervical extension strength  18.5   (Blank rows =  not tested)  GAIT: Distance walked: 100 feet Assistive device utilized:  None, although she does occasionally use a cane Level of assistance: Complete Independence Comments: Flexed posture noted with all activity  TODAY'S TREATMENT:                                                                                                                              DATE:  04/13/2023 Lumbar extension (hips forward) 5 x 3 seconds Lumbar left lateral bending with left hand on left greater trochanter and right arm overhead 5 x 3 seconds Shoulder blade pinches 5 x 5 seconds Single knee to chest stretch (other leg straight) 4 x 20 seconds Modified Maisie Fus  stretch 4 x 20 seconds Hamstrings stretch 4 x 20 seconds Yoga bridge 10 x 5 seconds -Hip hike 10 for 3 seconds Rows with Blue Thera-band 20 x 3 seconds Shoulder ER Green Thera-band 10 x 3 seconds Cervical extension Isometrics 10 x 5 seconds   04/06/2023 Lumbar extension (hips forward) 5 x 3 seconds Lumbar left lateral bending with left hand on left greater trochanter and right arm overhead 5 x 3 seconds Shoulder blade pinches 5 x 5 seconds Single knee to chest stretch (other leg straight) 4 x 20 seconds Modified Thomas stretch 4 x 20 seconds Hamstrings stretch 4 x 20 seconds Yoga bridge 10 x 5 seconds Hip hike 10 for 3 seconds Rows with Blue Thera-band 20 x 3 seconds Shoulder ER Green Thera-band 10 x 3 seconds Cervical extension Isometrics 10 x 5 seconds   04/04/2023 -Lumbar extension (hips forward) 5 x 3 seconds -Lumbar left lateral bending with left hand on left greater trochanter and right arm overhead 5 x 3 seconds -Shoulder blade pinches 5 x 5 seconds Single knee to chest stretch (other leg straight) 2 x 20 seconds Modified Thomas stretch 2 x 20 seconds Hamstrings stretch 2 x 20 seconds Yoga bridge 10 x 5 seconds Hip hike 10 for 3 seconds Rows with Blue Thera-band 20 x 3 seconds Shoulder ER Green Thera-band 10 x 3  seconds  Functional Activities: Reviewed log roll technique and golfer's lift   PATIENT EDUCATION:  Education details: See above Person educated: Patient Education method: Programmer, multimedia, Demonstration, Tactile cues, Verbal cues, and Handouts Education comprehension: verbalized understanding, returned demonstration, verbal cues required, tactile cues required, and needs further education  HOME EXERCISE PROGRAM: [id number]  ASSESSMENT:  CLINICAL IMPRESSION: Elaine Carter continues to cut back on the frequency of tylenol use secondary to progress with her posture and strength.  Posture and body mechanics awareness along with continued strength gains will prepare Elaine Carter for more independent rehabilitation and we discussed possible transition into more independent rehabilitation in 1-3 weeks.  OBJECTIVE IMPAIRMENTS: Abnormal gait, decreased activity tolerance, decreased endurance, decreased knowledge of condition, difficulty walking, decreased ROM, decreased strength, decreased safety awareness, impaired flexibility, improper body mechanics, postural dysfunction, and pain.   ACTIVITY LIMITATIONS: carrying, lifting, bending, standing, squatting, and locomotion level  PARTICIPATION LIMITATIONS: community activity  PERSONAL FACTORS: Breast cancer, foot OA, HTN are also affecting patient's functional outcome.   REHAB POTENTIAL: Good  CLINICAL DECISION MAKING: Stable/uncomplicated  EVALUATION COMPLEXITY: Low   GOALS: Goals reviewed with patient? Yes  SHORT TERM GOALS: Target date: 04/04/2023  Elaine Carter will be independent with her day 1 home exercise program Baseline: Started 03/07/2023 Goal status: Met 03/21/2023  2.  Elaine Carter will have an improved postural awareness and will be able to implement this into static and dynamic activities Baseline: Started education 03/07/2023 Goal status: Partially Met 04/13/2023  3.  Elaine Carter will be able to reduce left scapular pain with postural correction and therapeutic  exercises Baseline: Unable at evaluation Goal status: Partially met 04/13/2023   LONG TERM GOALS: Target date: 05/02/2023  Improve FOTO to 65 in 11 visits Baseline: 57, high risk adjusted 56 Goal status: Met 03/30/2023  2.  Elaine Carter will report left scapular and back pain consistently 0-3/10 on the numeric pain rating scale Baseline: 4/10 Goal status: Ongoing 04/13/2023  3.  Improve bilateral hip flexors flexibility to 95 degrees and hamstrings to 50 degrees Baseline: 85 degrees and 35 degrees respectively Goal status: Partially met 03/30/2023  4.  Elaine Carter will have improved postural and spine  strength as assessed by pain and FOTO functional scores Baseline: See pain and functional scores above Goal status: Partially met 03/30/2023  5.  Elaine Carter will be independent with a long-term home exercise maintenance program at discharge Baseline: Started 03/07/2023 Goal status: Ongoing 04/13/2023  PLAN:  PT FREQUENCY: 1-2x/week  PT DURATION: 8 weeks total, 4-5 weeks from today   PLANNED INTERVENTIONS: Therapeutic exercises, Therapeutic activity, Neuromuscular re-education, Balance training, Gait training, Patient/Family education, Self Care, Cryotherapy, and Manual therapy.  PLAN FOR NEXT SESSION: Progress postural work and Agricultural consultant.  Consider hip hike in the door frame and perhaps prone strength progressions.  Has she been able to cut back further on her Tylenol use?  Cherlyn Cushing, PT, MPT 04/13/2023, 1:42 PM

## 2023-04-16 ENCOUNTER — Ambulatory Visit: Payer: Medicare PPO | Admitting: Rehabilitative and Restorative Service Providers"

## 2023-04-16 ENCOUNTER — Encounter: Payer: Self-pay | Admitting: Rehabilitative and Restorative Service Providers"

## 2023-04-16 DIAGNOSIS — R293 Abnormal posture: Secondary | ICD-10-CM

## 2023-04-16 DIAGNOSIS — M6281 Muscle weakness (generalized): Secondary | ICD-10-CM

## 2023-04-16 DIAGNOSIS — M5459 Other low back pain: Secondary | ICD-10-CM | POA: Diagnosis not present

## 2023-04-16 NOTE — Therapy (Signed)
OUTPATIENT PHYSICAL THERAPY THORACOLUMBAR TREATMENT  Referring diagnosis?  Diagnosis  M54.50 (ICD-10-CM) - Low back pain, unspecified back pain laterality, unspecified chronicity, unspecified whether sciatica present   Treatment diagnosis? R29.3   M62.81   M54.59 What was this (referring dx) caused by? []  Surgery []  Fall []  Ongoing issue [x]  Arthritis []  Other: ____________  Laterality: []  Rt [x]  Lt []  Both  Check all possible CPT codes:  *CHOOSE 10 OR LESS*    []  88416 (Therapeutic Exercise)  []  92507 (SLP Treatment)  []  97112 (Neuro Re-ed)   []  92526 (Swallowing Treatment)   []  97116 (Gait Training)   []  [id number] (Cognitive Training, 1st 15 minutes) []  97140 (Manual Therapy)   []  97130 (Cognitive Training, each add'l 15 minutes)  []  97164 (Re-evaluation)                              []  Other, List CPT Code ____________  []  97530 (Therapeutic Activities)     []  97535 (Self Care)   [x]  All codes above (97110 - 97535)  []  97012 (Mechanical Traction)  []  97014 (E-stim Unattended)  []  97032 (E-stim manual)  []  97033 (Ionto)  []  97035 (Ultrasound) []  97750 (Physical Performance Training) []  [id number] (Aquatic Therapy) []  97016 (Vasopneumatic Device) []  [id number] (Paraffin) []  97034 (Contrast Bath) []  97597 (Wound Care 1st 20 sq cm) []  97598 (Wound Care each add'l 20 sq cm) []  97760 (Orthotic Fabrication, Fitting, Training Initial) []  [id number] (Prosthetic Management and Training Initial) []  [id number] (Orthotic or Prosthetic Training/ Modification Subsequent)   Patient Name: LEYLANI GARGIS MRN: [id number] DOB:02-16-1941, 82 y.o., female Today's Date: 04/16/2023  END OF SESSION:  PT End of Session - 04/16/23 1303     Visit Number 10    Number of Visits 12    Date for PT Re-Evaluation 05/02/23    Authorization Type [employer]    Authorization - Visit Number 10    Authorization - Number of Visits 12    Progress Note Due on Visit 12    PT Start Time 1302    PT Stop Time 1347    PT Time  Calculation (min) 45 min    Activity Tolerance Patient tolerated treatment well;No increased pain    Behavior During Therapy WFL for tasks assessed/performed                 Past Medical History:  Diagnosis Date   Angio-edema    Arthritis    Constipation    Contact lens/glasses fitting    wears contacts or glasses   Diverticular disease    Family history of cancer    Genetic testing 09/01/2016   Test results: 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) @ [facility]   Personal history of breast cancer 1998   left breast; estrogen positive   Past Surgical History:  Procedure Laterality Date   ABDOMINAL HYSTERECTOMY  2012   vag hystBSO   ANTERIOR AND POSTERIOR REPAIR  2012   bladder tape sliong-cysto  along with hyst   BREAST BIOPSY  1998   lt   BREAST LUMPECTOMY  1998   left   COLONOSCOPY     x3   LIPOMA EXCISION Left 02/06/2013   Procedure: EXCISION OF LEFT UPPER ARM LIPOMA;  Surgeon: Wayland Denis, DO;  Location: [facility];  Service: Plastics;  Laterality: Left;   TUBAL LIGATION     Patient Active Problem List   Diagnosis Date Noted   Allergic rhinitis 11/11/2021   Flexural atopic dermatitis 09/27/2021   Prurigo nodularis 09/27/2021   Contusion of toe 06/02/2020   Genetic testing 09/01/2016   Monoallelic mutation of CHEK2 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    Family history of cancer    Lipoma of arm 02/06/2013   Personal history of breast cancer 07/31/1996     PCP: Blair Heys, MD  REFERRING PROVIDER: Julieanne Cotton, PA-C  REFERRING DIAG:  Diagnosis  M54.50 (ICD-10-CM) - Low back pain, unspecified back pain laterality, unspecified chronicity, unspecified whether sciatica present    Rationale for Evaluation and Treatment: Rehabilitation  THERAPY DIAG:  Abnormal posture  Muscle weakness (generalized)  Other low back pain  ONSET DATE: Chronic, at least fall of 2023  SUBJECTIVE:                                                                                                                                                                                           SUBJECTIVE STATEMENT: Adriah has "only taken 3-4 does of tylenol" since her last visit. She continues to note progress week to week.  She notes that the poor weather this weekend contributed to more pain than normal yesterday.    Eval: Kasmira had a "gel shot" in her left knee in the fall of 2023.  She noted her back (left scapular area) has been bothering her as well.  She knows she has a left convex scoliosis.  No complaints of peripheral pain or paresthesias.  She is active in [employer].  PERTINENT HISTORY:  Breast cancer, foot OA, HTN  PAIN:  Are you having pain? Yes: NPRS scale: Although she is taking less medication, 0-4/10 the past 7 days on a 10/10 Pain location: Left scapular Pain description: Ache, sharp, spasm Aggravating factors: Flexion (sweeping, bending, lifting) Relieving factors: Resting, tylenol, postural correction  PRECAUTIONS: Back  RED FLAGS: None   WEIGHT BEARING RESTRICTIONS: No  FALLS:  Has patient fallen in last 6 months? No  LIVING ENVIRONMENT: Lives with: lives alone Lives in: House/apartment Stairs:  Does OK, usually uses elevator Has following equipment at home:  Has a cane that she uses occasionally  OCCUPATION: Retired  PLOF: Independent  PATIENT GOALS: Be able to do normal functional activities without left scapular  pain  NEXT MD VISIT: MRI 03/27/2023 and Dr. August Saucer 04/04/2023  OBJECTIVE:   DIAGNOSTIC FINDINGS:  AP lateral radiographs lumbar spine reviewed.  L4-5 spondylolisthesis is  present.  Scoliosis also present in the thoracolumbar region.  No acute  fractures.  Facet arthritis which is moderate also noted.   PATIENT SURVEYS:  03/30/2023: FOTO 69 (Goal met)  Eval: FOTO 57 (risk adjusted 56,Goal 65 in 11 visits)  SCREENING FOR RED FLAGS: Bowel or bladder incontinence: No Spinal tumors: No Cauda equina syndrome: No Compression fracture: No Abdominal aneurysm: No  COGNITION: Overall cognitive status: Within functional limits for tasks assessed     SENSATION: No complaints of peripheral pain or paresthesias  MUSCLE LENGTH: 04/16/2023: Hamstrings Right 45 deg; Left 40 deg  03/30/2023: Hamstrings: Right 40 deg; Left 40 deg  Eval: Hamstrings: Right 35 deg; Left 35 deg  POSTURE: rounded shoulders, forward head, decreased lumbar lordosis, flexed trunk , and left convex scoliosis  LUMBAR ROM:   AROM 03/06/2022 03/21/2023 03/30/2023  Flexion     Extension 10 10 10   Right lateral flexion 30 30 30   Left lateral flexion 20 30 30   Right rotation     Left rotation      (Blank rows = not tested)  LOWER EXTREMITY ROM:     Passive  Left/Right 04/07/2023 Left/Right 03/30/2023 Left/Right 04/16/2023  Hip flexion 85/85 95/100 110/115  Hip extension     Hip abduction     Hip adduction     Hip internal rotation 7/10 8/15 11/21  Hip external rotation 34/34 36/28 40/39   Knee flexion     Knee extension     Ankle dorsiflexion     Ankle plantarflexion     Ankle inversion     Ankle eversion      (Blank rows = not tested)  STRENGTH:    MMT Left/Right 03/07/2023 Left/Right 04/05/09  Hip flexion    Hip extension    Hip abduction    Hip adduction    Hip internal rotation    Hip external rotation    Knee flexion    Knee extension    Ankle dorsiflexion    Ankle plantarflexion    Ankle inversion     Ankle eversion    Cervical extension strength  18.5   (Blank rows = not tested)  GAIT: Distance walked: 100 feet Assistive device utilized:  None, although she does occasionally use a cane Level of assistance: Complete Independence Comments: Flexed posture noted with all activity  TODAY'S TREATMENT:                                                                                                                              DATE:  04/16/2023 Lumbar extension (hips forward) 5 x 3 seconds Lumbar left lateral bending with left hand on left greater trochanter and right arm overhead 5 x  3 seconds Shoulder blade pinches 5 x 5 seconds Single knee to chest stretch (other leg straight) 4 x 20 seconds Modified Thomas stretch 4 x 20 seconds Hamstrings stretch 4 x 20 seconds Yoga bridge 10 x 5 seconds Hip hike 10 for 3 seconds Rows with Blue Thera-band 20 x 3 seconds Shoulder ER Green Thera-band 10 x 3 seconds Cervical extension Isometrics 10 x 5 seconds   04/13/2023 Lumbar extension (hips forward) 5 x 3 seconds Lumbar left lateral bending with left hand on left greater trochanter and right arm overhead 5 x 3 seconds Shoulder blade pinches 5 x 5 seconds Single knee to chest stretch (other leg straight) 4 x 20 seconds Modified Thomas stretch 4 x 20 seconds Hamstrings stretch 4 x 20 seconds Yoga bridge 10 x 5 seconds -Hip hike 10 for 3 seconds Rows with Blue Thera-band 20 x 3 seconds Shoulder ER Green Thera-band 10 x 3 seconds Cervical extension Isometrics 10 x 5 seconds   04/06/2023 Lumbar extension (hips forward) 5 x 3 seconds Lumbar left lateral bending with left hand on left greater trochanter and right arm overhead 5 x 3 seconds Shoulder blade pinches 5 x 5 seconds Single knee to chest stretch (other leg straight) 4 x 20 seconds Modified Thomas stretch 4 x 20 seconds Hamstrings stretch 4 x 20 seconds Yoga bridge 10 x 5 seconds Hip hike 10 for 3 seconds Rows with Blue Thera-band 20  x 3 seconds Shoulder ER Green Thera-band 10 x 3 seconds Cervical extension Isometrics 10 x 5 seconds   PATIENT EDUCATION:  Education details: See above Person educated: Patient Education method: Programmer, multimedia, Demonstration, Tactile cues, Verbal cues, and Handouts Education comprehension: verbalized understanding, returned demonstration, verbal cues required, tactile cues required, and needs further education  HOME EXERCISE PROGRAM: [id number]  ASSESSMENT:  CLINICAL IMPRESSION: Alaijha continues to require less tylenol use secondary to progress with her posture and strength.  Posture and body mechanics awareness along with continued strength gains will prepare Belisa for more independent rehabilitation and we discussed possible transition into more independent rehabilitation in 1-2 visits.  We will assess spine AROM and strength next visit and I anticipate no more than 1-2 visits to meet long-term goals.  OBJECTIVE IMPAIRMENTS: Abnormal gait, decreased activity tolerance, decreased endurance, decreased knowledge of condition, difficulty walking, decreased ROM, decreased strength, decreased safety awareness, impaired flexibility, improper body mechanics, postural dysfunction, and pain.   ACTIVITY LIMITATIONS: carrying, lifting, bending, standing, squatting, and locomotion level  PARTICIPATION LIMITATIONS: community activity  PERSONAL FACTORS: Breast cancer, foot OA, HTN are also affecting patient's functional outcome.   REHAB POTENTIAL: Good  CLINICAL DECISION MAKING: Stable/uncomplicated  EVALUATION COMPLEXITY: Low   GOALS: Goals reviewed with patient? Yes  SHORT TERM GOALS: Target date: 04/04/2023  Assata will be independent with her day 1 home exercise program Baseline: Started 03/07/2023 Goal status: Met 03/21/2023  2.  Iridian will have an improved postural awareness and will be able to implement this into static and dynamic activities Baseline: Started education 03/07/2023 Goal  status: Partially Met 04/16/2023  3.  Ahlanna will be able to reduce left scapular pain with postural correction and therapeutic exercises Baseline: Unable at evaluation Goal status: Partially met 04/16/2023   LONG TERM GOALS: Target date: 05/02/2023  Improve FOTO to 65 in 11 visits Baseline: 57, high risk adjusted 56 Goal status: Met 03/30/2023  2.  Erminia will report left scapular and back pain consistently 0-3/10 on the numeric pain rating scale Baseline: 4/10 Goal status: On Going  04/16/2023  3.  Improve bilateral hip flexors flexibility to 95 degrees and hamstrings to 50 degrees Baseline: 85 degrees and 35 degrees respectively Goal status: Met 04/16/2023  4.  Kalai will have improved postural and spine strength as assessed by pain and FOTO functional scores Baseline: See pain and functional scores above Goal status: Partially met 04/16/2023  5.  Aleezay will be independent with a long-term home exercise maintenance program at discharge Baseline: Started 03/07/2023 Goal status: On Going 04/16/2023  PLAN:  PT FREQUENCY: 1-2 additional visits  PT DURATION: 8 weeks total, 4-5 weeks from today   PLANNED INTERVENTIONS: Therapeutic exercises, Therapeutic activity, Neuromuscular re-education, Balance training, Gait training, Patient/Family education, Self Care, Cryotherapy, and Manual therapy.  PLAN FOR NEXT SESSION: Trunk AROM and strength progress update.  DC or 1 time follow-up in a month?  Cherlyn Cushing, PT, MPT 04/16/2023, 3:46 PM

## 2023-04-18 ENCOUNTER — Encounter: Payer: Self-pay | Admitting: Rehabilitative and Restorative Service Providers"

## 2023-04-18 ENCOUNTER — Ambulatory Visit: Payer: Medicare PPO | Admitting: Rehabilitative and Restorative Service Providers"

## 2023-04-18 DIAGNOSIS — M5459 Other low back pain: Secondary | ICD-10-CM | POA: Diagnosis not present

## 2023-04-18 DIAGNOSIS — R293 Abnormal posture: Secondary | ICD-10-CM | POA: Diagnosis not present

## 2023-04-18 DIAGNOSIS — M6281 Muscle weakness (generalized): Secondary | ICD-10-CM | POA: Diagnosis not present

## 2023-04-18 NOTE — Therapy (Signed)
OUTPATIENT PHYSICAL THERAPY THORACOLUMBAR TREATMENT/PROGRESS NOTE  Referring diagnosis?  Diagnosis  M54.50 (ICD-10-CM) - Low back pain, unspecified back pain laterality, unspecified chronicity, unspecified whether sciatica present   Treatment diagnosis? R29.3   M62.81   M54.59 What was this (referring dx) caused by? []  Surgery []  Fall []  Ongoing issue [x]  Arthritis []  Other: ____________  Laterality: []  Rt [x]  Lt []  Both  Check all possible CPT codes:  *CHOOSE 10 OR LESS*    []  97110 (Therapeutic Exercise)  []  92507 (SLP Treatment)  []  97112 (Neuro Re-ed)   []  92526 (Swallowing Treatment)   []  97116 (Gait Training)   []  [id number] (Cognitive Training, 1st 15 minutes) []  97140 (Manual Therapy)   []  97130 (Cognitive Training, each add'l 15 minutes)  []  97164 (Re-evaluation)                              []  Other, List CPT Code ____________  []  97530 (Therapeutic Activities)     []  96045 (Self Care)   [x]  All codes above (97110 - 97535)  []  97012 (Mechanical Traction)  []  97014 (E-stim Unattended)  []  97032 (E-stim manual)  []  97033 (Ionto)  []  97035 (Ultrasound) []  97750 (Physical Performance Training) []  [id number] (Aquatic Therapy) []  97016 (Vasopneumatic Device) []  [id number] (Paraffin) []  97034 (Contrast Bath) []  97597 (Wound Care 1st 20 sq cm) []  97598 (Wound Care each add'l 20 sq cm) []  97760 (Orthotic Fabrication, Fitting, Training Initial) []  [id number] (Prosthetic Management and Training Initial) []  [id number] (Orthotic or Prosthetic Training/ Modification Subsequent)   Patient Name: Elaine Carter MRN: [id number] DOB:[birth year], 82 y.o., female Today's Date: 04/18/2023  END OF SESSION:  PT End of Session - 04/18/23 1256     Visit Number 11    Number of Visits 12    Date for PT Re-Evaluation 05/02/23    Authorization Type [employer]    Authorization - Visit Number 11    Authorization - Number of Visits 12    Progress Note Due on Visit 12    PT Start Time 1255    PT Stop Time 1343     PT Time Calculation (min) 48 min    Activity Tolerance Patient tolerated treatment well;No increased pain    Behavior During Therapy WFL for tasks assessed/performed               Past Medical History:  Diagnosis Date   Angio-edema    Arthritis    Constipation    Contact lens/glasses fitting    wears contacts or glasses   Diverticular disease    Family history of cancer    Genetic testing 09/01/2016   Test results: 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) @ [facility]   Personal history of breast cancer 1998   left breast; estrogen positive   Past Surgical History:  Procedure Laterality Date   ABDOMINAL HYSTERECTOMY  2012   vag hystBSO   ANTERIOR AND POSTERIOR REPAIR  2012   bladder tape sliong-cysto along  with hyst   BREAST BIOPSY  1998   lt   BREAST LUMPECTOMY  1998   left   COLONOSCOPY     x3   LIPOMA EXCISION Left 02/06/2013   Procedure: EXCISION OF LEFT UPPER ARM LIPOMA;  Surgeon: Wayland Denis, DO;  Location: [facility];  Service: Plastics;  Laterality: Left;   TUBAL LIGATION     Patient Active Problem List   Diagnosis Date Noted   Allergic rhinitis 11/11/2021   Flexural atopic dermatitis 09/27/2021   Prurigo nodularis 09/27/2021   Contusion of toe 06/02/2020   Genetic testing 09/01/2016   Monoallelic mutation of CHEK2 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    Family history of cancer    Lipoma of arm 02/06/2013   Personal history of breast cancer  07/31/1996    PCP: Blair Heys, MD  REFERRING PROVIDER: Julieanne Cotton, PA-C  REFERRING DIAG:  Diagnosis  M54.50 (ICD-10-CM) - Low back pain, unspecified back pain laterality, unspecified chronicity, unspecified whether sciatica present    Rationale for Evaluation and Treatment: Rehabilitation  THERAPY DIAG:  Abnormal posture  Muscle weakness (generalized)  Other low back pain  ONSET DATE: Chronic, at least fall of 2023  SUBJECTIVE:                                                                                                                                                                                           SUBJECTIVE STATEMENT: Elaine Carter notes significant overall progress since starting physical therapy.  She would like to transfer into independent rehabilitation and then return in a month or so for an objective reassessment and to answer any questions before transfer into completely independent rehabilitation.  Eval: Elaine Carter had a "gel shot" in her left knee in the fall of 2023.  She noted her back (left scapular area) has been bothering her as well.  She knows she has a left convex scoliosis.  No complaints of peripheral pain or paresthesias.  She is active in [employer].  PERTINENT HISTORY:  Breast cancer, foot OA, HTN  PAIN:  Are you having pain? Yes: NPRS scale: Although she is taking less medication, 0-4/10 the past 7 days on a 10/10 Pain location: Left scapular Pain description: Ache, sharp, spasm Aggravating factors: Flexion (sweeping, bending, lifting) Relieving factors: Resting, tylenol, postural correction  PRECAUTIONS: Back  RED FLAGS: None   WEIGHT BEARING RESTRICTIONS: No  FALLS:  Has patient fallen in last 6 months? No  LIVING ENVIRONMENT: Lives with: lives alone Lives in: House/apartment Stairs:  Does OK, usually uses elevator Has following equipment at home:  Has a cane that she uses occasionally  OCCUPATION: Retired  PLOF:  Independent  PATIENT GOALS: Be able to do normal functional activities without left scapular pain  NEXT MD VISIT: MRI 03/27/2023 and Dr. August Saucer 04/04/2023  OBJECTIVE:   DIAGNOSTIC FINDINGS:  AP lateral radiographs lumbar spine reviewed.  L4-5 spondylolisthesis is  present.  Scoliosis also present in the thoracolumbar region.  No acute  fractures.  Facet arthritis which is moderate also noted.   PATIENT SURVEYS:  03/30/2023: FOTO 69 (Goal met)  Eval: FOTO 57 (risk adjusted 56,Goal 65 in 11 visits)  SCREENING FOR RED FLAGS: Bowel or bladder incontinence: No Spinal tumors: No Cauda equina syndrome: No Compression fracture: No Abdominal aneurysm: No  COGNITION: Overall cognitive status: Within functional limits for tasks assessed     SENSATION: No complaints of peripheral pain or paresthesias  MUSCLE LENGTH: 04/16/2023: Hamstrings Right 45 deg; Left 40 deg  03/30/2023: Hamstrings: Right 40 deg; Left 40 deg  Eval: Hamstrings: Right 35 deg; Left 35 deg  POSTURE: rounded shoulders, forward head, decreased lumbar lordosis, flexed trunk , and left convex scoliosis  LUMBAR ROM:   AROM 03/06/2022 03/21/2023 03/30/2023 04/18/2023  Flexion      Extension 10 10 10 15   Right lateral flexion 30 30 30 30   Left lateral flexion 20 30 30 30   Right rotation      Left rotation       (Blank rows = not tested)  LOWER EXTREMITY ROM:     Passive  Left/Right 04/07/2023 Left/Right 03/30/2023 Left/Right 04/16/2023  Hip flexion 85/85 95/100 110/115  Hip extension     Hip abduction     Hip adduction     Hip internal rotation 7/10 8/15 11/21  Hip external rotation 34/34 36/28 40/39   Knee flexion     Knee extension     Ankle dorsiflexion     Ankle plantarflexion     Ankle inversion     Ankle eversion      (Blank rows = not tested)  STRENGTH:    MMT Left/Right 03/07/2023 Left/Right 04/05/09 Left/Right 04/18/2023  Hip flexion     Hip extension     Hip abduction     Hip adduction     Hip internal  rotation     Hip external rotation     Knee flexion     Knee extension     Ankle dorsiflexion     Ankle plantarflexion     Ankle inversion     Ankle eversion     Cervical extension strength  18.5 19.5   (Blank rows = not tested)  GAIT: Distance walked: 100 feet Assistive device utilized:  None, although she does occasionally use a cane Level of assistance: Complete Independence Comments: Flexed posture noted with all activity  TODAY'S TREATMENT:                                                                                                                              DATE:  04/18/2023  Lumbar extension (hips forward) 5 x 3 seconds Lumbar left lateral bending with left hand on left greater trochanter and right arm overhead 5 x 3 seconds Shoulder blade pinches 5 x 5 seconds Single knee to chest stretch (other leg straight) 4 x 20 seconds Modified Thomas stretch 4 x 20 seconds Hamstrings stretch 4 x 20 seconds Yoga bridge 10 x 5 seconds Hip hike 10 for 5 seconds Rows with Blue Thera-band 20 x 3 seconds Shoulder ER Blue Thera-band 10 x 3 seconds Cervical extension Isometrics 10 x 5 seconds  Functional Activities: Sit to stand 5 x slow eccentrics  Neuromuscular re-education: Tandem balance 4 x 20 seconds bilateral   04/16/2023 Lumbar extension (hips forward) 5 x 3 seconds Lumbar left lateral bending with left hand on left greater trochanter and right arm overhead 5 x 3 seconds Shoulder blade pinches 5 x 5 seconds Single knee to chest stretch (other leg straight) 4 x 20 seconds Modified Thomas stretch 4 x 20 seconds Hamstrings stretch 4 x 20 seconds Yoga bridge 10 x 5 seconds Hip hike 10 for 3 seconds Rows with Blue Thera-band 20 x 3 seconds Shoulder ER Green Thera-band 10 x 3 seconds Cervical extension Isometrics 10 x 5 seconds   04/13/2023 Lumbar extension (hips forward) 5 x 3 seconds Lumbar left lateral bending with left hand on left greater trochanter and right arm  overhead 5 x 3 seconds Shoulder blade pinches 5 x 5 seconds Single knee to chest stretch (other leg straight) 4 x 20 seconds Modified Thomas stretch 4 x 20 seconds Hamstrings stretch 4 x 20 seconds Yoga bridge 10 x 5 seconds -Hip hike 10 for 3 seconds Rows with Blue Thera-band 20 x 3 seconds Shoulder ER Green Thera-band 10 x 3 seconds Cervical extension Isometrics 10 x 5 seconds   PATIENT EDUCATION:  Education details: See above Person educated: Patient Education method: Programmer, multimedia, Demonstration, Tactile cues, Verbal cues, and Handouts Education comprehension: verbalized understanding, returned demonstration, verbal cues required, tactile cues required, and needs further education  HOME EXERCISE PROGRAM: [id number]  ASSESSMENT:  CLINICAL IMPRESSION: Elaine Carter continues to require less tylenol use secondary to progress with her posture and strength.  Elaine Carter reports she is feeling comfortable with her current home exercise program, but she requested to return in a month to answer any questions that she might have before discharge into completely independent rehabilitation.  Also let Elaine Carter know that it is perfectly fine to call and cancel that last appointment if things continue to go well with her home exercises.  OBJECTIVE IMPAIRMENTS: Abnormal gait, decreased activity tolerance, decreased endurance, decreased knowledge of condition, difficulty walking, decreased ROM, decreased strength, decreased safety awareness, impaired flexibility, improper body mechanics, postural dysfunction, and pain.   ACTIVITY LIMITATIONS: carrying, lifting, bending, standing, squatting, and locomotion level  PARTICIPATION LIMITATIONS: community activity  PERSONAL FACTORS: Breast cancer, foot OA, HTN are also affecting patient's functional outcome.   REHAB POTENTIAL: Good  CLINICAL DECISION MAKING: Stable/uncomplicated  EVALUATION COMPLEXITY: Low   GOALS: Goals reviewed with patient? Yes  SHORT TERM  GOALS: Target date: 04/04/2023  Elaine Carter will be independent with her day 1 home exercise program Baseline: Started 03/07/2023 Goal status: Met 03/21/2023  2.  Elaine Carter will have an improved postural awareness and will be able to implement this into static and dynamic activities Baseline: Started education 03/07/2023 Goal status: Partially Met 04/18/2023  3.  Elaine Carter will be able to reduce left scapular pain with postural correction and therapeutic exercises Baseline: Unable at evaluation Goal  status: Partially met 04/18/2023   LONG TERM GOALS: Target date: 05/02/2023  Improve FOTO to 65 in 11 visits Baseline: 57, high risk adjusted 56 Goal status: Met 03/30/2023  2.  Elaine Carter will report left scapular and back pain consistently 0-3/10 on the numeric pain rating scale Baseline: 4/10 Goal status: On Going 04/18/2023  3.  Improve bilateral hip flexors flexibility to 95 degrees and hamstrings to 50 degrees Baseline: 85 degrees and 35 degrees respectively Goal status: Met 04/16/2023  4.  Elaine Carter will have improved postural and spine strength as assessed by pain and FOTO functional scores Baseline: See pain and functional scores above Goal status: Partially met 04/18/2023  5.  Elaine Carter will be independent with a long-term home exercise maintenance program at discharge Baseline: Started 03/07/2023 Goal status: On Going 04/18/2023  PLAN:  PT FREQUENCY: 1 additional visit  PT DURATION: 4 weeks from today   PLANNED INTERVENTIONS: Therapeutic exercises, Therapeutic activity, Neuromuscular re-education, Balance training, Gait training, Patient/Family education, Self Care, Cryotherapy, and Manual therapy.  PLAN FOR NEXT SESSION: Answer questions regarding her home exercise program and likely transfer into independent rehabilitation  Cherlyn Cushing, PT, MPT 04/18/2023, 3:51 PM

## 2023-04-20 ENCOUNTER — Encounter: Payer: Medicare PPO | Admitting: Physical Therapy

## 2023-04-23 DIAGNOSIS — Z23 Encounter for immunization: Secondary | ICD-10-CM | POA: Diagnosis not present

## 2023-04-23 DIAGNOSIS — E871 Hypo-osmolality and hyponatremia: Secondary | ICD-10-CM | POA: Diagnosis not present

## 2023-04-27 ENCOUNTER — Ambulatory Visit (INDEPENDENT_AMBULATORY_CARE_PROVIDER_SITE_OTHER): Payer: Medicare PPO

## 2023-04-27 DIAGNOSIS — L209 Atopic dermatitis, unspecified: Secondary | ICD-10-CM

## 2023-05-11 ENCOUNTER — Ambulatory Visit: Payer: Medicare PPO | Admitting: *Deleted

## 2023-05-11 DIAGNOSIS — L209 Atopic dermatitis, unspecified: Secondary | ICD-10-CM | POA: Diagnosis not present

## 2023-05-15 DIAGNOSIS — T887XXA Unspecified adverse effect of drug or medicament, initial encounter: Secondary | ICD-10-CM | POA: Diagnosis not present

## 2023-05-15 DIAGNOSIS — I10 Essential (primary) hypertension: Secondary | ICD-10-CM | POA: Diagnosis not present

## 2023-05-16 ENCOUNTER — Encounter: Payer: Self-pay | Admitting: Rehabilitative and Restorative Service Providers"

## 2023-05-16 ENCOUNTER — Ambulatory Visit: Payer: Medicare PPO | Admitting: Rehabilitative and Restorative Service Providers"

## 2023-05-16 DIAGNOSIS — M5459 Other low back pain: Secondary | ICD-10-CM

## 2023-05-16 DIAGNOSIS — M6281 Muscle weakness (generalized): Secondary | ICD-10-CM

## 2023-05-16 DIAGNOSIS — R293 Abnormal posture: Secondary | ICD-10-CM

## 2023-05-16 NOTE — Therapy (Signed)
OUTPATIENT PHYSICAL THERAPY THORACOLUMBAR TREATMENT/PROGRESS NOTE  PHYSICAL THERAPY DISCHARGE SUMMARY  Visits from Start of Care: 12  Current functional level related to goals / functional outcomes: See note   Remaining deficits: See note   Education / Equipment: Updated home exercise program  Patient agrees to discharge. Patient goals were met. Patient is being discharged due to being pleased with the current functional level.    Referring diagnosis?  Diagnosis  M54.50 (ICD-10-CM) - Low back pain, unspecified back pain laterality, unspecified chronicity, unspecified whether sciatica present   Treatment diagnosis? R29.3   M62.81   M54.59 What was this (referring dx) caused by? []  Surgery []  Fall []  Ongoing issue [x]  Arthritis []  Other: ____________  Laterality: []  Rt [x]  Lt []  Both  Check all possible CPT codes:  *CHOOSE 10 OR LESS*    []  97110 (Therapeutic Exercise)  []  92507 (SLP Treatment)  []  97112 (Neuro Re-ed)   []  92526 (Swallowing Treatment)   []  97116 (Gait Training)   []  [id number] (Cognitive Training, 1st 15 minutes) []  29562 (Manual Therapy)   []  97130 (Cognitive Training, each add'l 15 minutes)  []  97164 (Re-evaluation)                              []  Other, List CPT Code ____________  []  97530 (Therapeutic Activities)     []  97535 (Self Care)   [x]  All codes above (97110 - 97535)  []  97012 (Mechanical Traction)  []  97014 (E-stim Unattended)  []  97032 (E-stim manual)  []  97033 (Ionto)  []  97035 (Ultrasound) []  97750 (Physical Performance Training) []  [id number] (Aquatic Therapy) []  97016 (Vasopneumatic Device) []  [id number] (Paraffin) []  97034 (Contrast Bath) []  97597 (Wound Care 1st 20 sq cm) []  97598 (Wound Care each add'l 20 sq cm) []  97760 (Orthotic Fabrication, Fitting, Training Initial) []  [id number] (Prosthetic Management and Training Initial) []  [id number] (Orthotic or Prosthetic Training/ Modification Subsequent)   Patient Name: Elaine Carter MRN:  [id number] DOB:27-Jan-1941, 82 y.o., female Today's Date: 05/16/2023  END OF SESSION:  PT End of Session - 05/16/23 1341     Visit Number 12    Number of Visits 12    Date for PT Re-Evaluation 05/02/23    Authorization Type [employer]    Authorization - Visit Number 12    Authorization - Number of Visits 12    Progress Note Due on Visit 12    PT Start Time 1340    PT Stop Time 1430    PT Time Calculation (min) 50 min    Activity Tolerance Patient tolerated treatment well;No increased pain    Behavior During Therapy WFL for tasks assessed/performed                Past Medical History:  Diagnosis Date   Angio-edema    Arthritis    Constipation    Contact lens/glasses fitting    wears contacts or glasses   Diverticular disease    Family history of cancer    Genetic testing 09/01/2016   Test results: 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) @ [facility]   Personal history of breast cancer 1998   left breast; estrogen positive   Past Surgical History:  Procedure Laterality Date   ABDOMINAL HYSTERECTOMY  2012   vag hystBSO   ANTERIOR AND POSTERIOR REPAIR  2012   bladder tape sliong-cysto along with hyst   BREAST BIOPSY  1998   lt   BREAST LUMPECTOMY  1998   left   COLONOSCOPY     x3   LIPOMA EXCISION Left 02/06/2013   Procedure: EXCISION OF LEFT UPPER ARM LIPOMA;  Surgeon: Wayland Denis, DO;  Location: [facility];  Service: Plastics;  Laterality: Left;    TUBAL LIGATION     Patient Active Problem List   Diagnosis Date Noted   Allergic rhinitis 11/11/2021   Flexural atopic dermatitis 09/27/2021   Prurigo nodularis 09/27/2021   Contusion of toe 06/02/2020   Genetic testing 09/01/2016   Monoallelic mutation of CHEK2 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    Family history of cancer    Lipoma of arm 02/06/2013   Personal history of breast cancer 07/31/1996    PCP: Blair Heys, MD  REFERRING PROVIDER: Julieanne Cotton, PA-C  REFERRING DIAG:  Diagnosis  M54.50 (ICD-10-CM) - Low back pain, unspecified back pain laterality, unspecified chronicity, unspecified whether sciatica present    Rationale for Evaluation and Treatment: Rehabilitation  THERAPY DIAG:  Abnormal posture  Muscle weakness (generalized)  Other low back pain  ONSET DATE: Chronic, at least fall of 2023  SUBJECTIVE:                                                                                                                                                                                           SUBJECTIVE STATEMENT: Elaine Carter notes significant overall progress since starting physical therapy.  She would like to transfer into independent rehabilitation and then return in a month or so for an objective reassessment and to answer any questions before transfer into completely independent rehabilitation.  Eval: Elaine Carter had a "gel shot" in her left knee in the fall of 2023.  She noted her back (left scapular area) has been bothering her as well.  She knows she has a left convex scoliosis.  No complaints of peripheral pain or paresthesias.  She is active in [employer].  PERTINENT HISTORY:  Breast cancer, foot OA, HTN  PAIN:  Are you having pain? Yes: NPRS scale: Although she is taking less medication, 0-4/10 the past 7 days on a 10/10 Pain location: Left scapular Pain description: Ache, sharp, spasm Aggravating factors: Flexion (  sweeping, bending, lifting) Relieving factors:  Resting, tylenol, postural correction  PRECAUTIONS: Back  RED FLAGS: None   WEIGHT BEARING RESTRICTIONS: No  FALLS:  Has patient fallen in last 6 months? No  LIVING ENVIRONMENT: Lives with: lives alone Lives in: House/apartment Stairs:  Does OK, usually uses elevator Has following equipment at home:  Has a cane that she uses occasionally  OCCUPATION: Retired  PLOF: Independent  PATIENT GOALS: Be able to do normal functional activities without left scapular pain  NEXT MD VISIT: MRI 03/27/2023 and Dr. August Saucer 04/04/2023  OBJECTIVE:   DIAGNOSTIC FINDINGS:  AP lateral radiographs lumbar spine reviewed.  L4-5 spondylolisthesis is  present.  Scoliosis also present in the thoracolumbar region.  No acute  fractures.  Facet arthritis which is moderate also noted.   PATIENT SURVEYS:  03/30/2023: FOTO 69 (Goal met)  Eval: FOTO 57 (risk adjusted 56,Goal 65 in 11 visits)  SCREENING FOR RED FLAGS: Bowel or bladder incontinence: No Spinal tumors: No Cauda equina syndrome: No Compression fracture: No Abdominal aneurysm: No  COGNITION: Overall cognitive status: Within functional limits for tasks assessed     SENSATION: No complaints of peripheral pain or paresthesias  MUSCLE LENGTH: 04/16/2023: Hamstrings Right 45 deg; Left 40 deg  03/30/2023: Hamstrings: Right 40 deg; Left 40 deg  Eval: Hamstrings: Right 35 deg; Left 35 deg  POSTURE: rounded shoulders, forward head, decreased lumbar lordosis, flexed trunk , and left convex scoliosis  LUMBAR ROM:   AROM 03/06/2022 03/21/2023 03/30/2023 04/18/2023  Flexion      Extension 10 10 10 15   Right lateral flexion 30 30 30 30   Left lateral flexion 20 30 30 30   Right rotation      Left rotation       (Blank rows = not tested)  LOWER EXTREMITY ROM:     Passive  Left/Right 04/07/2023 Left/Right 03/30/2023 Left/Right 04/16/2023  Hip flexion 85/85 95/100 110/115  Hip extension     Hip abduction     Hip adduction     Hip internal rotation  7/10 8/15 11/21  Hip external rotation 34/34 36/28 40/39   Knee flexion     Knee extension     Ankle dorsiflexion     Ankle plantarflexion     Ankle inversion     Ankle eversion      (Blank rows = not tested)  STRENGTH:    MMT Left/Right 03/07/2023 Left/Right 04/05/09 Left/Right 04/18/2023  Hip flexion     Hip extension     Hip abduction     Hip adduction     Hip internal rotation     Hip external rotation     Knee flexion     Knee extension     Ankle dorsiflexion     Ankle plantarflexion     Ankle inversion     Ankle eversion     Cervical extension strength  18.5 19.5   (Blank rows = not tested)  GAIT: Distance walked: 100 feet Assistive device utilized:  None, although she does occasionally use a cane Level of assistance: Complete Independence Comments: Flexed posture noted with all activity  TODAY'S TREATMENT:  DATE:  05/16/2023 Lumbar extension (hips forward) 5 x 3 seconds Lumbar left lateral bending with left hand on left greater trochanter and right arm overhead 5 x 3 seconds Shoulder blade pinches 5 x 5 seconds Single knee to chest stretch (other leg straight) 2 x 20 seconds Hamstrings stretch 2 x 20 seconds Yoga bridge 10 x 5 seconds Hip hike 10 for 5 seconds Rows with Blue Thera-band 20 x 3 seconds Cervical extension Isometrics 10 x 5 seconds  Functional Activities: Sit to stand 3 x slow eccentrics Review bed mobility and the importance of posture and avoiding flexed and slouched postures Answered questions regarding home exercise program and independent rehabilitation  Neuromuscular re-education: Tandem balance 2 x 20 seconds bilateral   04/18/2023 Lumbar extension (hips forward) 5 x 3 seconds Lumbar left lateral bending with left hand on left greater trochanter and right arm overhead 5 x 3 seconds Shoulder blade pinches 5 x 5  seconds Single knee to chest stretch (other leg straight) 4 x 20 seconds Modified Thomas stretch 4 x 20 seconds Hamstrings stretch 4 x 20 seconds Yoga bridge 10 x 5 seconds Hip hike 10 for 5 seconds Rows with Blue Thera-band 20 x 3 seconds Shoulder ER Blue Thera-band 10 x 3 seconds Cervical extension Isometrics 10 x 5 seconds  Functional Activities: Sit to stand 5 x slow eccentrics  Neuromuscular re-education: Tandem balance 4 x 20 seconds bilateral   04/16/2023 Lumbar extension (hips forward) 5 x 3 seconds Lumbar left lateral bending with left hand on left greater trochanter and right arm overhead 5 x 3 seconds Shoulder blade pinches 5 x 5 seconds Single knee to chest stretch (other leg straight) 4 x 20 seconds Modified Thomas stretch 4 x 20 seconds Hamstrings stretch 4 x 20 seconds Yoga bridge 10 x 5 seconds Hip hike 10 for 3 seconds Rows with Blue Thera-band 20 x 3 seconds Shoulder ER Green Thera-band 10 x 3 seconds Cervical extension Isometrics 10 x 5 seconds  PATIENT EDUCATION:  Education details: See above Person educated: Patient Education method: Programmer, multimedia, Demonstration, Tactile cues, Verbal cues, and Handouts Education comprehension: verbalized understanding, returned demonstration, verbal cues required, tactile cues required, and needs further education  HOME EXERCISE PROGRAM: [id number]  ASSESSMENT:  CLINICAL IMPRESSION: Elaine Carter has decreased her tylenol use to his little as 1 to 2 tablets/day secondary to progress with her posture and strength.  Elaine Carter had a few questions with her home exercise program that we answered, but overall she is comfortable with her current home exercise program.  She appears ready for discharge into completely independent rehabilitation.    OBJECTIVE IMPAIRMENTS: Abnormal gait, decreased activity tolerance, decreased endurance, decreased knowledge of condition, difficulty walking, decreased ROM, decreased strength, decreased safety  awareness, impaired flexibility, improper body mechanics, postural dysfunction, and pain.   ACTIVITY LIMITATIONS: carrying, lifting, bending, standing, squatting, and locomotion level  PARTICIPATION LIMITATIONS: community activity  PERSONAL FACTORS: Breast cancer, foot OA, HTN are also affecting patient's functional outcome.   REHAB POTENTIAL: Good  CLINICAL DECISION MAKING: Stable/uncomplicated  EVALUATION COMPLEXITY: Low   GOALS: Goals reviewed with patient? Yes  SHORT TERM GOALS: Target date: 04/04/2023  Elaine Carter will be independent with her day 1 home exercise program Baseline: Started 03/07/2023 Goal status: Met 03/21/2023  2.  Elaine Carter will have an improved postural awareness and will be able to implement this into static and dynamic activities Baseline: Started education 03/07/2023 Goal status: Met 05/16/2023  3.  Elaine Carter will be able to reduce left scapular pain  with postural correction and therapeutic exercises Baseline: Unable at evaluation Goal status: Met 05/16/2023   LONG TERM GOALS: Target date: 05/02/2023  Improve FOTO to 65 in 11 visits Baseline: 57, high risk adjusted 56 Goal status: Met 03/30/2023  2.  Elaine Carter will report left scapular and back pain consistently 0-3/10 on the numeric pain rating scale Baseline: 4/10 Goal status: On Going 05/16/2023  3.  Improve bilateral hip flexors flexibility to 95 degrees and hamstrings to 50 degrees Baseline: 85 degrees and 35 degrees respectively Goal status: Met 04/16/2023  4.  Elaine Carter will have improved postural and spine strength as assessed by pain and FOTO functional scores Baseline: See pain and functional scores above Goal status: Met 05/16/2023  5.  Elaine Carter will be independent with a long-term home exercise maintenance program at discharge Baseline: Started 03/07/2023 Goal status: Met 05/16/2023  PLAN:  PT FREQUENCY: DC  PT DURATION: DC   PLANNED INTERVENTIONS: Therapeutic exercises, Therapeutic activity,  Neuromuscular re-education, Balance training, Gait training, Patient/Family education, Self Care, Cryotherapy, and Manual therapy.  PLAN FOR NEXT SESSION: Transfer into independent rehabilitation today  Cherlyn Cushing, PT, MPT 05/16/2023, 4:30 PM

## 2023-05-22 ENCOUNTER — Ambulatory Visit: Payer: Medicare PPO | Admitting: *Deleted

## 2023-05-22 DIAGNOSIS — L209 Atopic dermatitis, unspecified: Secondary | ICD-10-CM

## 2023-05-24 ENCOUNTER — Ambulatory Visit: Payer: Medicare PPO

## 2023-06-06 ENCOUNTER — Ambulatory Visit: Payer: Medicare PPO

## 2023-06-07 ENCOUNTER — Ambulatory Visit (INDEPENDENT_AMBULATORY_CARE_PROVIDER_SITE_OTHER): Payer: Self-pay

## 2023-06-07 DIAGNOSIS — L209 Atopic dermatitis, unspecified: Secondary | ICD-10-CM | POA: Diagnosis not present

## 2023-06-07 DIAGNOSIS — I10 Essential (primary) hypertension: Secondary | ICD-10-CM | POA: Diagnosis not present

## 2023-06-20 DIAGNOSIS — M2040 Other hammer toe(s) (acquired), unspecified foot: Secondary | ICD-10-CM | POA: Diagnosis not present

## 2023-06-20 DIAGNOSIS — E785 Hyperlipidemia, unspecified: Secondary | ICD-10-CM | POA: Diagnosis not present

## 2023-06-20 DIAGNOSIS — M1990 Unspecified osteoarthritis, unspecified site: Secondary | ICD-10-CM | POA: Diagnosis not present

## 2023-06-20 DIAGNOSIS — Z791 Long term (current) use of non-steroidal anti-inflammatories (NSAID): Secondary | ICD-10-CM | POA: Diagnosis not present

## 2023-06-20 DIAGNOSIS — I10 Essential (primary) hypertension: Secondary | ICD-10-CM | POA: Diagnosis not present

## 2023-06-20 DIAGNOSIS — K219 Gastro-esophageal reflux disease without esophagitis: Secondary | ICD-10-CM | POA: Diagnosis not present

## 2023-06-20 DIAGNOSIS — M545 Low back pain, unspecified: Secondary | ICD-10-CM | POA: Diagnosis not present

## 2023-06-20 DIAGNOSIS — Z8249 Family history of ischemic heart disease and other diseases of the circulatory system: Secondary | ICD-10-CM | POA: Diagnosis not present

## 2023-06-20 DIAGNOSIS — Z87891 Personal history of nicotine dependence: Secondary | ICD-10-CM | POA: Diagnosis not present

## 2023-06-22 ENCOUNTER — Ambulatory Visit (INDEPENDENT_AMBULATORY_CARE_PROVIDER_SITE_OTHER): Payer: Medicare PPO

## 2023-06-22 DIAGNOSIS — L209 Atopic dermatitis, unspecified: Secondary | ICD-10-CM

## 2023-07-04 DIAGNOSIS — I10 Essential (primary) hypertension: Secondary | ICD-10-CM | POA: Diagnosis not present

## 2023-07-05 ENCOUNTER — Ambulatory Visit: Payer: Medicare PPO | Admitting: *Deleted

## 2023-07-05 DIAGNOSIS — L209 Atopic dermatitis, unspecified: Secondary | ICD-10-CM | POA: Diagnosis not present

## 2023-07-11 DIAGNOSIS — I10 Essential (primary) hypertension: Secondary | ICD-10-CM | POA: Diagnosis not present

## 2023-07-18 ENCOUNTER — Ambulatory Visit: Payer: Medicare PPO | Admitting: *Deleted

## 2023-07-18 DIAGNOSIS — L209 Atopic dermatitis, unspecified: Secondary | ICD-10-CM

## 2023-08-01 ENCOUNTER — Other Ambulatory Visit: Payer: Self-pay | Admitting: *Deleted

## 2023-08-01 MED ORDER — DUPIXENT 300 MG/2ML ~~LOC~~ SOSY: 300.0000 mg | PREFILLED_SYRINGE | SUBCUTANEOUS | 11 refills | Status: DC

## 2023-08-06 ENCOUNTER — Ambulatory Visit: Payer: Medicare PPO | Admitting: *Deleted

## 2023-08-06 DIAGNOSIS — L209 Atopic dermatitis, unspecified: Secondary | ICD-10-CM | POA: Diagnosis not present

## 2023-08-13 ENCOUNTER — Other Ambulatory Visit: Payer: Self-pay | Admitting: Hematology and Oncology

## 2023-08-13 DIAGNOSIS — Z1231 Encounter for screening mammogram for malignant neoplasm of breast: Secondary | ICD-10-CM

## 2023-08-17 ENCOUNTER — Ambulatory Visit
Admission: RE | Admit: 2023-08-17 | Discharge: 2023-08-17 | Disposition: A | Discharge status: Home / Self Care | Payer: Medicare PPO | Source: Ambulatory Visit | Attending: Hematology and Oncology | Admitting: Hematology and Oncology

## 2023-08-17 DIAGNOSIS — Z1231 Encounter for screening mammogram for malignant neoplasm of breast: Secondary | ICD-10-CM

## 2023-08-20 ENCOUNTER — Ambulatory Visit: Payer: Medicare PPO | Admitting: *Deleted

## 2023-08-20 DIAGNOSIS — L209 Atopic dermatitis, unspecified: Secondary | ICD-10-CM

## 2023-09-03 ENCOUNTER — Ambulatory Visit: Payer: Medicare PPO | Admitting: *Deleted

## 2023-09-03 DIAGNOSIS — L209 Atopic dermatitis, unspecified: Secondary | ICD-10-CM

## 2023-09-19 ENCOUNTER — Ambulatory Visit: Payer: Medicare PPO

## 2023-09-19 DIAGNOSIS — L209 Atopic dermatitis, unspecified: Secondary | ICD-10-CM

## 2023-10-04 ENCOUNTER — Ambulatory Visit: Payer: Medicare PPO

## 2023-10-08 ENCOUNTER — Ambulatory Visit: Payer: Medicare PPO

## 2023-10-08 DIAGNOSIS — L209 Atopic dermatitis, unspecified: Secondary | ICD-10-CM

## 2023-10-22 ENCOUNTER — Ambulatory Visit

## 2023-10-22 DIAGNOSIS — L209 Atopic dermatitis, unspecified: Secondary | ICD-10-CM

## 2023-11-05 ENCOUNTER — Ambulatory Visit

## 2023-11-05 DIAGNOSIS — L209 Atopic dermatitis, unspecified: Secondary | ICD-10-CM | POA: Diagnosis not present

## 2023-11-27 ENCOUNTER — Ambulatory Visit (INDEPENDENT_AMBULATORY_CARE_PROVIDER_SITE_OTHER)

## 2023-11-27 DIAGNOSIS — L209 Atopic dermatitis, unspecified: Secondary | ICD-10-CM

## 2023-12-03 ENCOUNTER — Telehealth: Payer: Self-pay | Admitting: *Deleted

## 2023-12-03 NOTE — Telephone Encounter (Signed)
 Patient advised she called to order her dupixent  and advised she was not approved for PAP for 2025. I called and they advised patient would need to call them and let them walk her through signing online re-enrollment for 2025 and go over the [facility] and hardship to get her approved for 2025. Patient advised of same

## 2023-12-11 ENCOUNTER — Ambulatory Visit

## 2023-12-11 DIAGNOSIS — L209 Atopic dermatitis, unspecified: Secondary | ICD-10-CM

## 2023-12-11 NOTE — Patient Instructions (Incomplete)
Atopic dermatitis/purigo nodularis Continue a daily moisturizing routine Continue Dupixent injections 300 mg once every 2 weeks Continue triamcinolone to red and itchy areas under your face up to twice a day as needed   Your blood pressure was elevated at today's visit.  Follow-up with your primary care provider for further evaluation of your blood pressure a soon as possible  Follow up with your dermatologist as recommended  Call the clinic if this treatment plan is not working well for you  Follow up in 1 year or sooner if needed.  Skin care recommendations   Bath time: Always use lukewarm water. AVOID very hot or cold water. Keep bathing time to 5-10 minutes. Do NOT use bubble bath. Use a mild soap and use just enough to wash the dirty areas. Do NOT scrub skin vigorously.  After bathing, pat dry your skin with a towel. Do NOT rub or scrub the skin.   Moisturizers and prescriptions:  ALWAYS apply moisturizers immediately after bathing (within 3 minutes). This helps to lock-in moisture. Use the moisturizer several times a day over the whole body. Good summer moisturizers include: Aveeno, CeraVe, Cetaphil. Good winter moisturizers include: Aquaphor, Vaseline, Cerave, Cetaphil, Eucerin, Vanicream. When using moisturizers along with medications, the moisturizer should be applied about one hour after applying the medication to prevent diluting effect of the medication or moisturize around where you applied the medications. When not using medications, the moisturizer can be continued twice daily as maintenance.   Laundry and clothing: Avoid laundry products with added color or perfumes. Use unscented hypo-allergenic laundry products such as Tide free, Cheer free & gentle, and All free and clear.  If the skin still seems dry or sensitive, you can try double-rinsing the clothes. Avoid tight or scratchy clothing such as wool. Do not use fabric softeners or dyer sheets.

## 2023-12-11 NOTE — Progress Notes (Unsigned)
   [address] [place] [address] Dept: [phone]  FOLLOW UP NOTE  Patient ID: Elaine Carter, female    DOB: May 25, 1941  Age: 83 y.o. MRN: [id number] Date of Office Visit: 12/13/2023  Assessment  Chief Complaint: No chief complaint on file.  HPI Elaine Carter is an 83 year old female who presents to the clinic for follow-up visit.  She was last seen in this clinic on 10/27/2022 by Marinus Sic, NP, for evaluation of atopic dermatitis and prurigo nodularis.  Discussed the use of AI scribe software for clinical note transcription with the patient, who gave verbal consent to proceed.  History of Present Illness      Drug Allergies:  Allergies  Allergen Reactions   Codeine Nausea And Vomiting   Indapamide     Other Reaction(s): hyponatremia   Lisinopril Cough   Sulfa Antibiotics    Dover Two-Sided Adhesive Strap [Attends Briefs Small] Rash   Fexofenadine Hcl Nausea And Vomiting and Nausea Only   Tape Itching and Rash    Physical Exam: There were no vitals taken for this visit.   Physical Exam  Diagnostics:    Assessment and Plan: No diagnosis found.  No orders of the defined types were placed in this encounter.   There are no Patient Instructions on file for this visit.  No follow-ups on file.    Thank you for the opportunity to care for this patient.  Please do not hesitate to contact me with questions.  Marinus Sic, FNP [facility]

## 2023-12-13 ENCOUNTER — Ambulatory Visit: Admitting: Family Medicine

## 2023-12-13 ENCOUNTER — Encounter: Payer: Self-pay | Admitting: Family Medicine

## 2023-12-13 VITALS — BP 138/50 | HR 61 | Temp 98.0°F | Resp 16 | Ht <= 58 in | Wt 111.3 lb

## 2023-12-13 DIAGNOSIS — L209 Atopic dermatitis, unspecified: Secondary | ICD-10-CM

## 2023-12-13 DIAGNOSIS — R011 Cardiac murmur, unspecified: Secondary | ICD-10-CM | POA: Diagnosis not present

## 2023-12-13 DIAGNOSIS — L281 Prurigo nodularis: Secondary | ICD-10-CM | POA: Diagnosis not present

## 2023-12-26 ENCOUNTER — Ambulatory Visit

## 2024-01-02 ENCOUNTER — Ambulatory Visit

## 2024-01-02 DIAGNOSIS — L209 Atopic dermatitis, unspecified: Secondary | ICD-10-CM

## 2024-01-16 ENCOUNTER — Ambulatory Visit

## 2024-01-21 ENCOUNTER — Ambulatory Visit (INDEPENDENT_AMBULATORY_CARE_PROVIDER_SITE_OTHER)

## 2024-01-21 DIAGNOSIS — L209 Atopic dermatitis, unspecified: Secondary | ICD-10-CM | POA: Diagnosis not present

## 2024-02-05 ENCOUNTER — Ambulatory Visit

## 2024-02-05 DIAGNOSIS — L209 Atopic dermatitis, unspecified: Secondary | ICD-10-CM

## 2024-02-11 ENCOUNTER — Encounter: Payer: Self-pay | Admitting: Hematology and Oncology

## 2024-02-12 ENCOUNTER — Other Ambulatory Visit

## 2024-02-14 ENCOUNTER — Inpatient Hospital Stay
Admission: RE | Admit: 2024-02-14 | Discharge: 2024-02-14 | Discharge status: Home / Self Care | Source: Ambulatory Visit | Attending: Hematology and Oncology

## 2024-02-14 DIAGNOSIS — Z1501 Genetic susceptibility to malignant neoplasm of breast: Secondary | ICD-10-CM

## 2024-02-14 MED ORDER — GADOPICLENOL 0.5 MMOL/ML IV SOLN
5.0000 mL | Freq: Once | INTRAVENOUS | Status: AC | PRN
  Administered 2024-02-14: 5 mL via INTRAVENOUS

## 2024-02-18 ENCOUNTER — Ambulatory Visit

## 2024-02-18 DIAGNOSIS — L209 Atopic dermatitis, unspecified: Secondary | ICD-10-CM

## 2024-02-19 ENCOUNTER — Inpatient Hospital Stay: Payer: Medicare PPO | Attending: Hematology and Oncology | Admitting: Hematology and Oncology

## 2024-02-19 VITALS — BP 120/68 | HR 72 | Temp 98.1°F | Resp 17 | Ht <= 58 in | Wt 108.3 lb

## 2024-02-19 DIAGNOSIS — Z1502 Genetic susceptibility to malignant neoplasm of ovary: Secondary | ICD-10-CM | POA: Diagnosis not present

## 2024-02-19 DIAGNOSIS — Z1589 Genetic susceptibility to other disease: Secondary | ICD-10-CM | POA: Diagnosis not present

## 2024-02-19 DIAGNOSIS — Z1501 Genetic susceptibility to malignant neoplasm of breast: Secondary | ICD-10-CM | POA: Diagnosis not present

## 2024-02-19 DIAGNOSIS — Z1509 Genetic susceptibility to other malignant neoplasm: Secondary | ICD-10-CM | POA: Diagnosis present

## 2024-02-19 DIAGNOSIS — I10 Essential (primary) hypertension: Secondary | ICD-10-CM | POA: Insufficient documentation

## 2024-02-19 NOTE — Assessment & Plan Note (Signed)
 Based on NCCN guidelines, this is a pathogenic mutation that has been associated with risk of not only breast cancer but also colon, thyroid and kidney cancers   Breast cancer surveillance: Breast MRI 02/14/2024: No evidence of malignancy, density category B Mammogram 08/20/2023: Benign breast density category C Breast exam 02/19/2024: Benign   We will plan to do breast MRIs every other year.  Next MRI will be done in July 2027      Other cancer surveillance: Apart of breast cancer, there are no definite surveillance approaches to kidney cancer. For colon cancer she will need a colonoscopy  every 5 years. For thyroid cancer, she will need manual thyroid examinations for any lumps or nodules. Patient is not very keen on doing colonoscopy because she is anxious about going under anesthesia.  She will discuss with her primary care physician about Cologuard.   Return to clinic in 1 year for follow-up.

## 2024-02-19 NOTE — Progress Notes (Signed)
 Patient Care Team: Auston Opal, DO as PCP - General (Family Medicine)  DIAGNOSIS:  Encounter Diagnosis  Name Primary?   Monoallelic mutation of CHEK2 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 Yes   CHIEF COMPLIANT: Surveillance for breast cancer and CHEK2 gene mutation  HISTORY OF PRESENT ILLNESS: History of Present Illness Elaine Carter is an 83 year old female who presents for follow-up regarding routine cancer screening.  She has been experiencing bowel problems since January. She recently completed a stool test and is awaiting results. She underwent an MRI last Thursday as part of her routine cancer screening and has MRIs every other year. She also had a mammogram in January.     ALLERGIES:  is allergic to codeine, indapamide, lisinopril, sulfa antibiotics, valsartan, dover two-sided adhesive strap [attends briefs small], fexofenadine hcl, and tape.  MEDICATIONS:  Current Outpatient Medications  Medication Sig Dispense Refill   Acetaminophen (TYLENOL DISSOLVE PACKS) 500 MG PACK 2 tablets     amLODipine (NORVASC) 5 MG tablet Take 5 mg by mouth daily.     aspirin EC 81 MG tablet Take 81 mg by mouth daily.     Azelastine HCl 137 MCG/SPRAY SOLN      calcium  citrate-vitamin D  (CITRACAL+D) 315-200 MG-UNIT tablet Take 1 tablet by mouth daily.     chlorhexidine (PERIDEX) 0.12 % solution Use as directed 15 mLs in the mouth or throat as needed.     chlorpheniramine (CHLOR-TRIMETON) 4 MG tablet Take 4 mg by mouth 2 (two) times daily as needed for allergies.     cholecalciferol (VITAMIN D ) 1000 UNITS tablet Take 1,000 Units by mouth daily. 2000u     clobetasol (TEMOVATE) 0.05 % external solution  (Patient not taking: Reported on 12/13/2023)     cyanocobalamin (VITAMIN B12) 500 MCG tablet 1 tablet Orally Once a day     diltiazem (TIAZAC) 180 MG 24 hr capsule  (Patient not taking: Reported on 12/13/2023)     dupilumab  (DUPIXENT ) 300 MG/2ML prefilled syringe Inject 300 mg into the skin every 14 (fourteen)  days. 4 mL 11   fluocinonide gel (LIDEX) 0.05 %      fluticasone (CUTIVATE) 0.05 % cream      hydrOXYzine  (ATARAX /VISTARIL ) 25 MG tablet Take 1 tablet (25 mg total) by mouth every evening. 30 tablet 5   levocetirizine (XYZAL ) 5 MG tablet Take 2 tablets (10 mg total) by mouth every evening. 60 tablet 5   levofloxacin (LEVAQUIN) 500 MG tablet Take 500 mg by mouth daily.     losartan (COZAAR) 100 MG tablet Take 100 mg by mouth daily.     meloxicam (MOBIC) 15 MG tablet Take 15 mg by mouth daily.     ofloxacin (OCUFLOX) 0.3 % ophthalmic solution      omeprazole (PRILOSEC) 20 MG capsule Take 20 mg by mouth daily.     ondansetron  (ZOFRAN -ODT) 4 MG disintegrating tablet 1 tablet on the tongue and allow to dissolve Orally every 8 hours for 14 days     polycarbophil (FIBERCON) 625 MG tablet Take 625 mg by mouth as needed.     spironolactone (ALDACTONE) 100 MG tablet 1 tablet Orally twice daily     triamcinolone  ointment (KENALOG ) 0.1 % Apply 1 application topically 2 (two) times daily. 454 g 3   Current Facility-Administered Medications  Medication Dose Route Frequency Provider Last Rate Last Admin   dupilumab  (DUPIXENT ) prefilled syringe 300 mg  300 mg Subcutaneous Q14 Days Gallagher, Joel Louis, MD   300 mg at 02/18/24 1147  PHYSICAL EXAMINATION: ECOG PERFORMANCE STATUS: 1 - Symptomatic but completely ambulatory  Vitals:   02/19/24 1432 02/19/24 1440  BP: (!) 141/64 120/68  Pulse:    Resp:    Temp:    SpO2:     Filed Weights   02/19/24 1431  Weight: 108 lb 4.8 oz (49.1 kg)    Physical Exam VITALS: BP- 120/60  (exam performed in the presence of a chaperone)  LABORATORY DATA:  I have reviewed the data as listed    Latest Ref Rng & Units 11/07/2017    2:11 PM 11/01/2016    3:39 PM 02/04/2013   12:00 PM  CMP  Glucose 70 - 140 mg/dL 877   880   BUN 7 - 26 mg/dL 15   22   Creatinine 9.39 - 1.10 mg/dL 9.21  9.29  9.30   Sodium 136 - 145 mmol/L 136   134   Potassium 3.5 - 5.1 mmol/L  3.2   3.5   Chloride 98 - 109 mmol/L 103   96   CO2 22 - 29 mmol/L 25   27   Calcium  8.4 - 10.4 mg/dL 9.5   9.4   Total Protein 6.4 - 8.3 g/dL 6.7     Total Bilirubin 0.2 - 1.2 mg/dL 0.7     Alkaline Phos 40 - 150 U/L 128     AST 5 - 34 U/L 15     ALT 0 - 55 U/L 15       Lab Results  Component Value Date   WBC 8.0 11/07/2017   HGB 12.1 11/07/2017   HCT 34.4 (L) 11/07/2017   MCV 97.5 11/07/2017   PLT 282 11/07/2017   NEUTROABS 5.2 11/07/2017    ASSESSMENT & PLAN:  Monoallelic mutation of CHEK2 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 Based on NCCN guidelines, this is a pathogenic mutation that has been associated with risk of not only breast cancer but also colon, thyroid and kidney cancers   Breast cancer surveillance: Breast MRI 02/14/2024: No evidence of malignancy, density category B Mammogram 08/20/2023: Benign breast density category C Breast exam 02/19/2024: Benign   We will plan to do breast MRIs every other year.  Next MRI will be done in July 2027      Other cancer surveillance: Apart of breast cancer, there are no definite surveillance approaches to kidney cancer. For colon cancer she will need a colonoscopy  every 5 years. For thyroid cancer, she will need manual thyroid examinations for any lumps or nodules. Patient is not very keen on doing colonoscopy and is awaiting the results of Cologuard.   Return to clinic in 1 year for follow-up. Assessment & Plan Hypertension Blood pressure recently satisfactory at 120. Home readings may be inaccurate due to incorrect cuff size. - Discuss blood pressure readings with primary care provider in August. - Ensure correct cuff size for home monitoring.  Arthritis Arthritis limits exercise due to pain and decreased stamina. - Encourage treadmill use to reduce joint impact. - Consider consulting a trainer for personalized exercise plan.  Breast and Colon Health Monitoring MRI and mammogram schedules established. - Continue MRI every other  year. - Schedule annual mammogram. - Schedule annual breast exam.      No orders of the defined types were placed in this encounter.  The patient has a good understanding of the overall plan. she agrees with it. she will call with any problems that may develop before the next visit here. Total time spent: 30 mins including  face to face time and time spent for planning, charting and co-ordination of care   Viinay K Wilmary Levit, MD 02/19/24

## 2024-02-20 ENCOUNTER — Ambulatory Visit

## 2024-02-20 ENCOUNTER — Ambulatory Visit (INDEPENDENT_AMBULATORY_CARE_PROVIDER_SITE_OTHER)

## 2024-02-20 ENCOUNTER — Ambulatory Visit: Admitting: Podiatry

## 2024-02-20 DIAGNOSIS — M2012 Hallux valgus (acquired), left foot: Secondary | ICD-10-CM | POA: Diagnosis not present

## 2024-02-20 DIAGNOSIS — M2042 Other hammer toe(s) (acquired), left foot: Secondary | ICD-10-CM | POA: Diagnosis not present

## 2024-02-20 NOTE — Progress Notes (Signed)
 Chief Complaint  Patient presents with   Bunions    Here today to check about the bunion getting worse on the left foot and the pain in the left arch. Xrays are up in room. She stated that you and she decided not to do SX in 2022, due to age, and that she was not a good candiate. She is not diabetic and takes ASA    HPI: 83 y.o. female presenting today as a established new patient for evaluation of symptomatic severe bunion and hammertoe deformity to the bilateral feet left greater than the right.  Progressive onset over the last several years.  We have continued to pursue conservative treatment management.  Patient lives alone and has no family or friends in this region and she would not be a surgical candidate simply because she would have no help or assistance postoperatively.  She would like to discuss today additional conservative treatments  Past Medical History:  Diagnosis Date   Angio-edema    Arthritis    Constipation    Contact lens/glasses fitting    wears contacts or glasses   Diverticular disease    Family history of cancer    Genetic testing 09/01/2016   Test results: 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) @ [facility]   Personal history of breast cancer 1998   left breast; estrogen positive    Past Surgical History:  Procedure Laterality Date   ABDOMINAL HYSTERECTOMY  2012   vag hystBSO   ANTERIOR AND POSTERIOR REPAIR  2012   bladder tape sliong-cysto along  with hyst   BREAST BIOPSY  1998   lt   BREAST LUMPECTOMY  1998   left   COLONOSCOPY     x3   LIPOMA EXCISION Left 02/06/2013   Procedure: EXCISION OF LEFT UPPER ARM LIPOMA;  Surgeon: Estefana Reichert, DO;  Location: [facility];  Service: Plastics;  Laterality: Left;   TUBAL LIGATION      Allergies  Allergen Reactions   Codeine Nausea And Vomiting   Indapamide     Other Reaction(s): hyponatremia   Lisinopril Cough   Sulfa Antibiotics    Valsartan     Other Reaction(s): stomach upset   Dover Two-Sided Adhesive Strap [Attends Briefs Small] Rash   Fexofenadine Hcl Nausea And Vomiting and Nausea Only   Tape Itching and Rash     Physical Exam: General: The patient is alert and oriented x3 in no acute distress.  Dermatology: Skin is warm, dry and supple bilateral lower extremities.   Vascular: Palpable pedal pulses bilaterally. Capillary refill within normal limits.  No appreciable edema.  No erythema.  Neurological: Grossly intact via light touch  Musculoskeletal Exam: Severe bunion and hammertoe deformity noted with crossover deformity to the bilateral feet left greater than the right.  Pes planovalgus deformity also noted  Radiographic Exam LT foot 02/20/2024:  Severe bunion deformity noted with degenerative changes noted to the first MTP bilateral feet.  Dorsal dislocation of the MTP of the second toe and hammertoe contracture  with overlapping deformity also noted to the second digit left  Assessment/Plan of Care: 1.  Hallux valgus bilateral with DJD 2.  Hammertoe overlapping deformity second digit bilateral 3.  Pes planovalgus deformity bilateral  -Patient evaluated.  X-rays reviewed -Continue conservative treatment and management.  Continue padding to alleviate pressure between the great toe and adjacent second digit -Recommend good supportive tennis shoes.  Recommended the [employer] in [place], [place] -Return to clinic PRN    Thresa EMERSON Sar,  DPM [facility]  Dr. Thresa EMERSON Sar, DPM    [address] [place], [place] [address]                Office [phone]  Fax [phone]

## 2024-03-03 ENCOUNTER — Ambulatory Visit

## 2024-03-03 DIAGNOSIS — L209 Atopic dermatitis, unspecified: Secondary | ICD-10-CM

## 2024-03-17 ENCOUNTER — Ambulatory Visit

## 2024-03-17 DIAGNOSIS — L209 Atopic dermatitis, unspecified: Secondary | ICD-10-CM

## 2024-03-21 ENCOUNTER — Other Ambulatory Visit: Payer: Self-pay

## 2024-03-21 ENCOUNTER — Encounter (HOSPITAL_BASED_OUTPATIENT_CLINIC_OR_DEPARTMENT_OTHER): Payer: Self-pay | Admitting: Emergency Medicine

## 2024-03-21 ENCOUNTER — Inpatient Hospital Stay (HOSPITAL_BASED_OUTPATIENT_CLINIC_OR_DEPARTMENT_OTHER)
Admission: EM | Admit: 2024-03-21 | Discharge: 2024-03-23 | DRG: 641 | Disposition: A | Discharge status: Home / Self Care | Source: Ambulatory Visit | Attending: Internal Medicine | Admitting: Internal Medicine

## 2024-03-21 ENCOUNTER — Emergency Department (HOSPITAL_BASED_OUTPATIENT_CLINIC_OR_DEPARTMENT_OTHER)

## 2024-03-21 DIAGNOSIS — Z803 Family history of malignant neoplasm of breast: Secondary | ICD-10-CM

## 2024-03-21 DIAGNOSIS — N179 Acute kidney failure, unspecified: Secondary | ICD-10-CM | POA: Diagnosis present

## 2024-03-21 DIAGNOSIS — Z91048 Other nonmedicinal substance allergy status: Secondary | ICD-10-CM

## 2024-03-21 DIAGNOSIS — Z7982 Long term (current) use of aspirin: Secondary | ICD-10-CM

## 2024-03-21 DIAGNOSIS — Z882 Allergy status to sulfonamides status: Secondary | ICD-10-CM

## 2024-03-21 DIAGNOSIS — E871 Hypo-osmolality and hyponatremia: Secondary | ICD-10-CM | POA: Diagnosis not present

## 2024-03-21 DIAGNOSIS — I10 Essential (primary) hypertension: Secondary | ICD-10-CM | POA: Diagnosis present

## 2024-03-21 DIAGNOSIS — E878 Other disorders of electrolyte and fluid balance, not elsewhere classified: Secondary | ICD-10-CM | POA: Diagnosis present

## 2024-03-21 DIAGNOSIS — Z87891 Personal history of nicotine dependence: Secondary | ICD-10-CM

## 2024-03-21 DIAGNOSIS — Z809 Family history of malignant neoplasm, unspecified: Secondary | ICD-10-CM

## 2024-03-21 DIAGNOSIS — Z806 Family history of leukemia: Secondary | ICD-10-CM

## 2024-03-21 DIAGNOSIS — E872 Acidosis, unspecified: Secondary | ICD-10-CM | POA: Diagnosis present

## 2024-03-21 DIAGNOSIS — K219 Gastro-esophageal reflux disease without esophagitis: Secondary | ICD-10-CM | POA: Diagnosis present

## 2024-03-21 DIAGNOSIS — Z885 Allergy status to narcotic agent status: Secondary | ICD-10-CM

## 2024-03-21 DIAGNOSIS — I493 Ventricular premature depolarization: Secondary | ICD-10-CM | POA: Diagnosis present

## 2024-03-21 DIAGNOSIS — D509 Iron deficiency anemia, unspecified: Secondary | ICD-10-CM | POA: Diagnosis present

## 2024-03-21 DIAGNOSIS — R54 Age-related physical debility: Secondary | ICD-10-CM | POA: Diagnosis present

## 2024-03-21 DIAGNOSIS — R911 Solitary pulmonary nodule: Secondary | ICD-10-CM | POA: Diagnosis present

## 2024-03-21 DIAGNOSIS — Z800 Family history of malignant neoplasm of digestive organs: Secondary | ICD-10-CM

## 2024-03-21 DIAGNOSIS — E861 Hypovolemia: Secondary | ICD-10-CM | POA: Diagnosis present

## 2024-03-21 DIAGNOSIS — Z1501 Genetic susceptibility to malignant neoplasm of breast: Secondary | ICD-10-CM

## 2024-03-21 DIAGNOSIS — Z888 Allergy status to other drugs, medicaments and biological substances status: Secondary | ICD-10-CM

## 2024-03-21 DIAGNOSIS — E860 Dehydration: Secondary | ICD-10-CM | POA: Diagnosis present

## 2024-03-21 DIAGNOSIS — Z791 Long term (current) use of non-steroidal anti-inflammatories (NSAID): Secondary | ICD-10-CM

## 2024-03-21 DIAGNOSIS — Z79899 Other long term (current) drug therapy: Secondary | ICD-10-CM

## 2024-03-21 DIAGNOSIS — E875 Hyperkalemia: Secondary | ICD-10-CM | POA: Diagnosis present

## 2024-03-21 DIAGNOSIS — Z853 Personal history of malignant neoplasm of breast: Secondary | ICD-10-CM

## 2024-03-21 LAB — COMPREHENSIVE METABOLIC PANEL WITH GFR
ALT: 13 U/L (ref 0–44)
AST: 23 U/L (ref 15–41)
Albumin: 4.7 g/dL (ref 3.5–5.0)
Alkaline Phosphatase: 106 U/L (ref 38–126)
Anion gap: 16 — ABNORMAL HIGH (ref 5–15)
BUN: 31 mg/dL — ABNORMAL HIGH (ref 8–23)
CO2: 15 mmol/L — ABNORMAL LOW (ref 22–32)
Calcium: 10.5 mg/dL — ABNORMAL HIGH (ref 8.9–10.3)
Chloride: 88 mmol/L — ABNORMAL LOW (ref 98–111)
Creatinine, Ser: 1.52 mg/dL — ABNORMAL HIGH (ref 0.44–1.00)
GFR, Estimated: 34 mL/min — ABNORMAL LOW (ref >60)
Glucose, Bld: 120 mg/dL — ABNORMAL HIGH (ref 70–99)
Potassium: 5.3 mmol/L — ABNORMAL HIGH (ref 3.5–5.1)
Sodium: 119 mmol/L — CL (ref 135–145)
Total Bilirubin: 0.6 mg/dL (ref 0.0–1.2)
Total Protein: 7 g/dL (ref 6.5–8.1)

## 2024-03-21 LAB — URINALYSIS, ROUTINE W REFLEX MICROSCOPIC
Bacteria, UA: NONE SEEN
Bilirubin Urine: NEGATIVE
Glucose, UA: NEGATIVE mg/dL
Hgb urine dipstick: NEGATIVE
Ketones, ur: NEGATIVE mg/dL
Nitrite: NEGATIVE
Protein, ur: NEGATIVE mg/dL
Specific Gravity, Urine: 1.016 (ref 1.005–1.030)
pH: 5 (ref 5.0–8.0)

## 2024-03-21 LAB — CBC
HCT: 27.6 % — ABNORMAL LOW (ref 36.0–46.0)
Hemoglobin: 9.7 g/dL — ABNORMAL LOW (ref 12.0–15.0)
MCH: 32 pg (ref 26.0–34.0)
MCHC: 35.1 g/dL (ref 30.0–36.0)
MCV: 91.1 fL (ref 80.0–100.0)
Platelets: 297 K/uL (ref 150–400)
RBC: 3.03 MIL/uL — ABNORMAL LOW (ref 3.87–5.11)
RDW: 13.8 % (ref 11.5–15.5)
WBC: 8.1 K/uL (ref 4.0–10.5)
nRBC: 0 % (ref 0.0–0.2)

## 2024-03-21 LAB — LIPASE, BLOOD: Lipase: 61 U/L — ABNORMAL HIGH (ref 11–51)

## 2024-03-21 MED ORDER — SODIUM CHLORIDE 0.9 % IV BOLUS
500.0000 mL | Freq: Once | INTRAVENOUS | Status: AC
  Administered 2024-03-21: 500 mL via INTRAVENOUS

## 2024-03-21 NOTE — ED Provider Notes (Signed)
 [place] EMERGENCY DEPARTMENT AT [facility] Provider Note   CSN: [id number] Arrival date & time: 03/21/24  1946     Patient presents with: Hyponatremia   Elaine Carter is a 83 y.o. female.   This is a pleasant 83 year old female who is here today for hyponatremia.  She had a regular physical at her PCPs office today, was called back when her sodium was noted to be low.  Patient has what sounds like an adequate diet, eating meats, grains and vegetables regularly.  She does buy mostly prepared foods.  She tells me she does not drink much water.  She has intermittently had some abdominal discomfort over the last half year.  She is followed with GI for this.        Prior to Admission medications   Medication Sig Start Date End Date Taking? Authorizing Provider  Acetaminophen (TYLENOL DISSOLVE PACKS) 500 MG PACK 2 tablets    [provider]  amLODipine (NORVASC) 5 MG tablet Take 5 mg by mouth daily.    [provider]  aspirin EC 81 MG tablet Take 81 mg by mouth daily.    [provider]  Azelastine HCl 137 MCG/SPRAY SOLN  12/06/20   [provider]  calcium  citrate-vitamin D  (CITRACAL+D) 315-200 MG-UNIT tablet Take 1 tablet by mouth daily. 02/14/21   Gudena, Vinay, MD  chlorhexidine (PERIDEX) 0.12 % solution Use as directed 15 mLs in the mouth or throat as needed. 11/09/21   [provider]  chlorpheniramine (CHLOR-TRIMETON) 4 MG tablet Take 4 mg by mouth 2 (two) times daily as needed for allergies.    [provider]  cholecalciferol (VITAMIN D ) 1000 UNITS tablet Take 1,000 Units by mouth daily. 2000u    [provider]  clobetasol (TEMOVATE) 0.05 % external solution  04/28/20   [provider]  cyanocobalamin (VITAMIN B12) 500 MCG tablet 1 tablet Orally Once a day    [provider]  diltiazem (TIAZAC) 180 MG 24 hr capsule     [provider]  dupilumab  (DUPIXENT ) 300 MG/2ML prefilled  syringe Inject 300 mg into the skin every 14 (fourteen) days. 08/01/23   Iva Marty Saltness, MD  fluocinonide gel (LIDEX) 0.05 %     [provider]  fluticasone (CUTIVATE) 0.05 % cream  04/26/20   [provider]  hydrOXYzine  (ATARAX /VISTARIL ) 25 MG tablet Take 1 tablet (25 mg total) by mouth every evening. 02/15/21   Iva Marty Saltness, MD  levocetirizine (XYZAL ) 5 MG tablet Take 2 tablets (10 mg total) by mouth every evening. 02/15/21 02/20/24  Iva Marty Saltness, MD  levofloxacin (LEVAQUIN) 500 MG tablet Take 500 mg by mouth daily. 10/05/23   [provider]  losartan (COZAAR) 100 MG tablet Take 100 mg by mouth daily.    [provider]  meloxicam (MOBIC) 15 MG tablet Take 15 mg by mouth daily.    [provider]  ofloxacin (OCUFLOX) 0.3 % ophthalmic solution  03/15/20   [provider]  omeprazole (PRILOSEC) 20 MG capsule Take 20 mg by mouth daily.    [provider]  ondansetron  (ZOFRAN -ODT) 4 MG disintegrating tablet 1 tablet on the tongue and allow to dissolve Orally every 8 hours for 14 days 12/12/23   [provider]  polycarbophil (FIBERCON) 625 MG tablet Take 625 mg by mouth as needed.    [provider]  spironolactone (ALDACTONE) 100 MG tablet 1 tablet Orally twice daily 11/07/23   [provider]  triamcinolone  ointment (KENALOG ) 0.1 % Apply 1 application topically 2 (two) times daily. 02/15/21   Iva Marty Saltness, MD    Allergies: Codeine, Indapamide, Lisinopril, Sulfa antibiotics, Valsartan, Dover two-sided adhesive strap [attends briefs small], Fexofenadine hcl, and Tape    Review of Systems  Updated Vital Signs BP (!) 173/66   Pulse 70   Temp 97.6 F (36.4 C)   Resp 18   SpO2 100%   Physical Exam Vitals and nursing note reviewed.  Constitutional:      Appearance: She is not ill-appearing.  Cardiovascular:     Rate and Rhythm: Normal rate.     Pulses: Normal pulses.   Pulmonary:     Effort: Pulmonary effort is normal.  Abdominal:     General: Abdomen is flat. There is no distension.     Palpations: Abdomen is soft. There is no mass.  Musculoskeletal:     Cervical back: Normal range of motion.  Neurological:     Mental Status: She is alert.     (all labs ordered are listed, but only abnormal results are displayed) Labs Reviewed  LIPASE, BLOOD - Abnormal; Notable for the following components:      Result Value   Lipase 61 (*)    All other components within normal limits  COMPREHENSIVE METABOLIC PANEL WITH GFR - Abnormal; Notable for the following components:   Sodium 119 (*)    Potassium 5.3 (*)    Chloride 88 (*)    CO2 15 (*)    Glucose, Bld 120 (*)    BUN 31 (*)    Creatinine, Ser 1.52 (*)    Calcium  10.5 (*)    GFR, Estimated 34 (*)    Anion gap 16 (*)    All other components within normal limits  CBC - Abnormal; Notable for the following components:   RBC 3.03 (*)    Hemoglobin 9.7 (*)    HCT 27.6 (*)    All other components within normal limits  URINALYSIS, ROUTINE W REFLEX MICROSCOPIC - Abnormal; Notable for the following components:   Leukocytes,Ua MODERATE (*)    All other components within normal limits    EKG: None  Radiology: CT ABDOMEN PELVIS WO CONTRAST Result Date: 03/21/2024 CLINICAL DATA:  Diarrhea. Nausea. Fatigue. New blood pressure medication. EXAM: CT ABDOMEN AND PELVIS WITHOUT CONTRAST TECHNIQUE: Multidetector CT imaging of the abdomen and pelvis was performed following the standard protocol without IV contrast. RADIATION DOSE REDUCTION: This exam was performed according to the departmental dose-optimization program which includes automated exposure control, adjustment of the mA and/or kV according to patient size and/or use of iterative reconstruction technique. COMPARISON:  None Available. FINDINGS: Lower chest: Emphysematous changes in the lungs. Right lung base nodule measuring 7 mm. Hepatobiliary: No focal  liver abnormality is seen. No gallstones, gallbladder wall thickening, or biliary dilatation. Pancreas: Unremarkable. No pancreatic ductal dilatation or surrounding inflammatory changes. Spleen: Normal in size without focal abnormality. Adrenals/Urinary Tract: Adrenal glands are unremarkable. Kidneys are normal, without renal calculi, focal lesion, or hydronephrosis. Bladder is unremarkable. Stomach/Bowel: Stomach, small bowel, and colon are not abnormally distended. Scattered stool throughout the colon. No wall thickening or inflammatory stranding. Appendix is not identified. Vascular/Lymphatic: Aortic atherosclerosis. No enlarged abdominal or pelvic lymph nodes. Reproductive: Status post hysterectomy. No adnexal masses. Other: No free air or free fluid in the abdomen. Small periumbilical hernia containing fat. Musculoskeletal: Degenerative changes in the spine. Mild anterior subluxation of L4 on L5, likely degenerative. No acute bony abnormalities. IMPRESSION:  1. No evidence of bowel obstruction or inflammation. 2. 7 mm nodule in the right lung base. Non-contrast chest CT at 6-12 months is recommended. If the nodule is stable at time of repeat CT, then future CT at 18-24 months (from today's scan) is considered optional for low-risk patients, but is recommended for high-risk patients. This recommendation follows the consensus statement: Guidelines for Management of Incidental Pulmonary Nodules Detected on CT Images: From the Fleischner Society 2017; Radiology 2017; 284:228-243. 3. Aortic atherosclerosis. 4. Small periumbilical hernia containing fat. Electronically Signed   By: Elsie Gravely M.D.   On: 03/21/2024 23:12     .Critical Care  Performed by: Gravely Fairy DASEN, DO Authorized by: Gravely Fairy DASEN, DO   Critical care provider statement:    Critical care time (minutes):  30   Critical care was necessary to treat or prevent imminent or life-threatening deterioration of the following conditions:   Metabolic crisis (hyponatremia)   Critical care was time spent personally by me on the following activities:  Development of treatment plan with patient or surrogate, discussions with consultants, evaluation of patient's response to treatment, examination of patient, ordering and review of laboratory studies, ordering and review of radiographic studies, ordering and performing treatments and interventions, pulse oximetry, re-evaluation of patient's condition and review of old charts    Medications Ordered in the ED  sodium chloride  0.9 % bolus 500 mL (has no administration in time range)                                    Medical Decision Making Pleasant 83 year old female here today with hyponatremia at her physical.  Plan-patient does not show any symptoms of hyponatremia.  Likely has occurred over some time.  Does have a mild elevation in her creatinine, potassium mildly elevated.  Patient has an adequate diet, no diuretics.  Will admit to hospitalist for hyponatremia.  Considered treating the patient's potassium, however believe some fluids and rechecking labs is likely appropriate.  Amount and/or Complexity of Data Reviewed Labs: ordered. Radiology: ordered.  Risk Decision regarding hospitalization.        Final diagnoses:  Hyponatremia    ED Discharge Orders     None          Gravely Fairy DASEN, DO 03/21/24 2337

## 2024-03-21 NOTE — ED Notes (Signed)
 [facility] called for consult.

## 2024-03-21 NOTE — ED Triage Notes (Signed)
 Ongoing diarrhea, nausea. New BP medication. Feeling fatigued. Sent by PCP for low sodium.

## 2024-03-21 NOTE — Progress Notes (Signed)
 Plan of Care Note for accepted transfer   Patient: Elaine Carter MRN: [id number]   DOA: 03/21/2024  Facility requesting transfer: [facility]   Requesting Provider: Dr. Mannie   Reason for transfer: Hyponatremia   Facility course: 83 yr old female with HTN and arthritis presents at the direction of her PCP for hyponatremia on routine blood work.   Labs are most notalbe for serum sodium is 119, SCr 1.52, CO2 15, AG 16, and Hgb 9.7. She is essentially asymptomatic per ED physician.   Plan of care: The patient is accepted for admission to Progressive unit, at [facility]   Author: Evalene GORMAN Sprinkles, MD 03/21/2024  Check [email or web address] for on-call coverage.  Nursing staff, Please call [facility] Admits & Consults System-Wide number on Amion as soon as patient's arrival, so appropriate admitting provider can evaluate the pt.

## 2024-03-22 DIAGNOSIS — Z87891 Personal history of nicotine dependence: Secondary | ICD-10-CM | POA: Diagnosis not present

## 2024-03-22 DIAGNOSIS — E871 Hypo-osmolality and hyponatremia: Secondary | ICD-10-CM | POA: Diagnosis present

## 2024-03-22 DIAGNOSIS — Z853 Personal history of malignant neoplasm of breast: Secondary | ICD-10-CM | POA: Diagnosis not present

## 2024-03-22 DIAGNOSIS — R911 Solitary pulmonary nodule: Secondary | ICD-10-CM | POA: Diagnosis present

## 2024-03-22 DIAGNOSIS — E861 Hypovolemia: Secondary | ICD-10-CM | POA: Diagnosis present

## 2024-03-22 DIAGNOSIS — R54 Age-related physical debility: Secondary | ICD-10-CM | POA: Diagnosis present

## 2024-03-22 DIAGNOSIS — E875 Hyperkalemia: Secondary | ICD-10-CM | POA: Diagnosis present

## 2024-03-22 DIAGNOSIS — Z885 Allergy status to narcotic agent status: Secondary | ICD-10-CM | POA: Diagnosis not present

## 2024-03-22 DIAGNOSIS — Z79899 Other long term (current) drug therapy: Secondary | ICD-10-CM | POA: Diagnosis not present

## 2024-03-22 DIAGNOSIS — D509 Iron deficiency anemia, unspecified: Secondary | ICD-10-CM | POA: Diagnosis present

## 2024-03-22 DIAGNOSIS — Z888 Allergy status to other drugs, medicaments and biological substances status: Secondary | ICD-10-CM | POA: Diagnosis not present

## 2024-03-22 DIAGNOSIS — E860 Dehydration: Secondary | ICD-10-CM | POA: Diagnosis present

## 2024-03-22 DIAGNOSIS — Z91048 Other nonmedicinal substance allergy status: Secondary | ICD-10-CM | POA: Diagnosis not present

## 2024-03-22 DIAGNOSIS — E872 Acidosis, unspecified: Secondary | ICD-10-CM | POA: Diagnosis present

## 2024-03-22 DIAGNOSIS — Z809 Family history of malignant neoplasm, unspecified: Secondary | ICD-10-CM | POA: Diagnosis not present

## 2024-03-22 DIAGNOSIS — N179 Acute kidney failure, unspecified: Secondary | ICD-10-CM | POA: Diagnosis present

## 2024-03-22 DIAGNOSIS — Z882 Allergy status to sulfonamides status: Secondary | ICD-10-CM | POA: Diagnosis not present

## 2024-03-22 DIAGNOSIS — I493 Ventricular premature depolarization: Secondary | ICD-10-CM | POA: Diagnosis present

## 2024-03-22 DIAGNOSIS — Z800 Family history of malignant neoplasm of digestive organs: Secondary | ICD-10-CM | POA: Diagnosis not present

## 2024-03-22 DIAGNOSIS — I10 Essential (primary) hypertension: Secondary | ICD-10-CM | POA: Diagnosis present

## 2024-03-22 DIAGNOSIS — Z1501 Genetic susceptibility to malignant neoplasm of breast: Secondary | ICD-10-CM | POA: Diagnosis not present

## 2024-03-22 DIAGNOSIS — Z806 Family history of leukemia: Secondary | ICD-10-CM | POA: Diagnosis not present

## 2024-03-22 DIAGNOSIS — E878 Other disorders of electrolyte and fluid balance, not elsewhere classified: Secondary | ICD-10-CM | POA: Diagnosis present

## 2024-03-22 DIAGNOSIS — K219 Gastro-esophageal reflux disease without esophagitis: Secondary | ICD-10-CM | POA: Diagnosis present

## 2024-03-22 LAB — BASIC METABOLIC PANEL WITH GFR
Anion gap: 8 (ref 5–15)
BUN: 26 mg/dL — ABNORMAL HIGH (ref 8–23)
CO2: 19 mmol/L — ABNORMAL LOW (ref 22–32)
Calcium: 9.5 mg/dL (ref 8.9–10.3)
Chloride: 99 mmol/L (ref 98–111)
Creatinine, Ser: 1.14 mg/dL — ABNORMAL HIGH (ref 0.44–1.00)
GFR, Estimated: 48 mL/min — ABNORMAL LOW (ref >60)
Glucose, Bld: 104 mg/dL — ABNORMAL HIGH (ref 70–99)
Potassium: 4.6 mmol/L (ref 3.5–5.1)
Sodium: 126 mmol/L — ABNORMAL LOW (ref 135–145)

## 2024-03-22 LAB — COMPREHENSIVE METABOLIC PANEL WITH GFR
ALT: 14 U/L (ref 0–44)
AST: 20 U/L (ref 15–41)
Albumin: 3.8 g/dL (ref 3.5–5.0)
Alkaline Phosphatase: 77 U/L (ref 38–126)
Anion gap: 3 — ABNORMAL LOW (ref 5–15)
BUN: 30 mg/dL — ABNORMAL HIGH (ref 8–23)
CO2: 20 mmol/L — ABNORMAL LOW (ref 22–32)
Calcium: 9.5 mg/dL (ref 8.9–10.3)
Chloride: 99 mmol/L (ref 98–111)
Creatinine, Ser: 1.38 mg/dL — ABNORMAL HIGH (ref 0.44–1.00)
GFR, Estimated: 38 mL/min — ABNORMAL LOW (ref >60)
Glucose, Bld: 98 mg/dL (ref 70–99)
Potassium: 5.3 mmol/L — ABNORMAL HIGH (ref 3.5–5.1)
Sodium: 122 mmol/L — ABNORMAL LOW (ref 135–145)
Total Bilirubin: 0.8 mg/dL (ref 0.0–1.2)
Total Protein: 6.3 g/dL — ABNORMAL LOW (ref 6.5–8.1)

## 2024-03-22 LAB — CBC
HCT: 26.2 % — ABNORMAL LOW (ref 36.0–46.0)
Hemoglobin: 9.3 g/dL — ABNORMAL LOW (ref 12.0–15.0)
MCH: 32.6 pg (ref 26.0–34.0)
MCHC: 35.5 g/dL (ref 30.0–36.0)
MCV: 91.9 fL (ref 80.0–100.0)
Platelets: 296 K/uL (ref 150–400)
RBC: 2.85 MIL/uL — ABNORMAL LOW (ref 3.87–5.11)
RDW: 13.8 % (ref 11.5–15.5)
WBC: 6.4 K/uL (ref 4.0–10.5)
nRBC: 0 % (ref 0.0–0.2)

## 2024-03-22 LAB — OSMOLALITY, URINE: Osmolality, Ur: 205 mosm/kg — ABNORMAL LOW (ref 300–900)

## 2024-03-22 LAB — SODIUM, URINE, RANDOM: Sodium, Ur: 51 mmol/L

## 2024-03-22 LAB — LACTIC ACID, PLASMA: Lactic Acid, Venous: 1.5 mmol/L (ref 0.5–1.9)

## 2024-03-22 LAB — MAGNESIUM: Magnesium: 2 mg/dL (ref 1.7–2.4)

## 2024-03-22 LAB — PHOSPHORUS: Phosphorus: 2.9 mg/dL (ref 2.5–4.6)

## 2024-03-22 MED ORDER — DICLOFENAC SODIUM 1 % EX GEL
2.0000 g | Freq: Four times a day (QID) | CUTANEOUS | Status: DC | PRN
  Administered 2024-03-22: 2 g via TOPICAL
  Filled 2024-03-22: qty 100

## 2024-03-22 MED ORDER — HEPARIN SODIUM (PORCINE) 5000 UNIT/ML IJ SOLN
5000.0000 [IU] | Freq: Three times a day (TID) | INTRAMUSCULAR | Status: DC
  Administered 2024-03-22 – 2024-03-23 (×4): 5000 [IU] via SUBCUTANEOUS
  Filled 2024-03-22 (×4): qty 1

## 2024-03-22 MED ORDER — STERILE WATER FOR INJECTION IV SOLN
INTRAVENOUS | Status: DC
  Filled 2024-03-22: qty 1000

## 2024-03-22 MED ORDER — SODIUM CHLORIDE 0.9 % IV SOLN: INTRAVENOUS | Status: AC

## 2024-03-22 MED ORDER — ASPIRIN 81 MG PO TBEC
81.0000 mg | DELAYED_RELEASE_TABLET | Freq: Every day | ORAL | Status: DC
  Administered 2024-03-22 – 2024-03-23 (×2): 81 mg via ORAL
  Filled 2024-03-22 (×2): qty 1

## 2024-03-22 MED ORDER — OYSTER SHELL CALCIUM/D3 500-5 MG-MCG PO TABS
1.0000 | ORAL_TABLET | Freq: Every day | ORAL | Status: DC
  Administered 2024-03-22 – 2024-03-23 (×2): 1 via ORAL
  Filled 2024-03-22 (×2): qty 1

## 2024-03-22 MED ORDER — ACETAMINOPHEN 325 MG PO TABS
650.0000 mg | ORAL_TABLET | Freq: Four times a day (QID) | ORAL | Status: DC | PRN
  Administered 2024-03-22: 650 mg via ORAL
  Filled 2024-03-22: qty 2

## 2024-03-22 MED ORDER — CALCIUM POLYCARBOPHIL 625 MG PO TABS: 625.0000 mg | ORAL_TABLET | Freq: Two times a day (BID) | ORAL | Status: DC | PRN

## 2024-03-22 MED ORDER — AMLODIPINE BESYLATE 5 MG PO TABS: 5.0000 mg | ORAL_TABLET | Freq: Every day | ORAL | Status: DC

## 2024-03-22 MED ORDER — SODIUM ZIRCONIUM CYCLOSILICATE 10 G PO PACK
10.0000 g | PACK | Freq: Once | ORAL | Status: AC
  Administered 2024-03-22: 10 g via ORAL
  Filled 2024-03-22: qty 1

## 2024-03-22 MED ORDER — VITAMIN D 25 MCG (1000 UNIT) PO TABS
2000.0000 [IU] | ORAL_TABLET | Freq: Every day | ORAL | Status: DC
  Administered 2024-03-22 – 2024-03-23 (×2): 2000 [IU] via ORAL
  Filled 2024-03-22 (×2): qty 2

## 2024-03-22 MED ORDER — ACETAMINOPHEN 650 MG RE SUPP: 650.0000 mg | Freq: Four times a day (QID) | RECTAL | Status: DC | PRN

## 2024-03-22 MED ORDER — ONDANSETRON 4 MG PO TBDP: 4.0000 mg | ORAL_TABLET | Freq: Three times a day (TID) | ORAL | Status: DC | PRN

## 2024-03-22 MED ORDER — AMLODIPINE BESYLATE 10 MG PO TABS
10.0000 mg | ORAL_TABLET | Freq: Every day | ORAL | Status: DC
  Administered 2024-03-22 – 2024-03-23 (×2): 10 mg via ORAL
  Filled 2024-03-22 (×2): qty 1

## 2024-03-22 MED ORDER — ALBUTEROL SULFATE (2.5 MG/3ML) 0.083% IN NEBU
2.5000 mg | INHALATION_SOLUTION | RESPIRATORY_TRACT | Status: DC | PRN
Start: 2024-03-22 — End: 2024-03-23

## 2024-03-22 NOTE — Progress Notes (Signed)
 Informed by telemetry that patient has frequent PVCs.MD made aware.

## 2024-03-22 NOTE — Plan of Care (Signed)
  Problem: Clinical Measurements: Goal: Diagnostic test results will improve Outcome: Progressing   Problem: Coping: Goal: Level of anxiety will decrease Outcome: Progressing   

## 2024-03-22 NOTE — H&P (Addendum)
 History and Physical    Patient: Elaine Carter FMW:[id number] DOB: [birth year] DOA: 03/21/2024 DOS: the patient was seen and examined on 03/22/2024 PCP: Auston Opal, DO  Patient coming from: Patient was directed by PCP to be admitted on account of abnormal labs  Chief Complaint:  Chief Complaint  Patient presents with   Hyponatremia   HPI: Elaine Carter is a 83 y.o. female with medical history significant of Chek 2 mutation, regularly follows up with hematologist Dr. Odean.  Other comorbidities include hypertension on amlodipine , losartan and spironolactone.She also has chronic GERD for which she takes Omeprazole.  Over the past month however, patient has had intractable abdominal pain, for that reason, she has had poor oral intake.  She went to her primary care physician's office for her regular medical.  Labs done resulted showing abnormal labs.  She was advised to come to the emergency room to be further evaluated.  Patient admits to feeling generally weak but denies any fatigue, headache, dizziness or any altered mental status.  Labs done in the ED revealed profound hyponatremia of 119, potassium of 5.7.  Her bicarb was also low at 15.  Review of systems: As mentioned in the history of present illness. All other systems reviewed and are negative. Past Medical History:  Diagnosis Date   Angio-edema    Arthritis    Constipation    Contact lens/glasses fitting    wears contacts or glasses   Diverticular disease    Family history of cancer    Genetic testing 09/01/2016   Test results: 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*7) @ [facility]   Personal history of breast cancer 1998   left breast; estrogen positive   Past Surgical History:  Procedure Laterality Date   ABDOMINAL HYSTERECTOMY  2012   vag hystBSO   ANTERIOR AND POSTERIOR REPAIR  2012   bladder tape sliong-cysto along with hyst   BREAST BIOPSY  1998   lt   BREAST LUMPECTOMY  1998   left   COLONOSCOPY     x3   LIPOMA EXCISION Left 02/06/2013   Procedure: EXCISION OF LEFT UPPER ARM LIPOMA;  Surgeon: Estefana Reichert, DO;  Location: [facility];  Service: Plastics;  Laterality: Left;   TUBAL LIGATION     Social History:  reports that she quit smoking about 25 years ago. Her smoking use included cigarettes. She has never used smokeless tobacco. She reports current alcohol use. She reports that she does not use drugs.  Allergies  Allergen Reactions   Codeine Nausea And Vomiting   Indapamide     Other Reaction(s): hyponatremia   Lisinopril Cough   Sulfa Antibiotics    Valsartan     Other Reaction(s): stomach upset   [place] Two-Sided Adhesive Strap [Attends Briefs Small] Rash   Fexofenadine Hcl Nausea And Vomiting and Nausea Only   Tape Itching and Rash    Family History  Problem Relation Age of Onset   Prostate cancer Maternal Uncle  Dx 42s; deceased 35s   Stomach cancer Paternal Uncle        Dx 51s; deceased 55   Cancer Maternal Uncle        unk. type   Cancer Maternal Uncle        throat ca (smoker); deceased 2s   Leukemia Cousin        mat female 1st cousin; deceased 51   Breast cancer Other        mat grandmother's sister    Prior to Admission medications   Medication Sig Start Date End Date Taking? Authorizing Provider  Acetaminophen  (TYLENOL  DISSOLVE PACKS) 500 MG PACK 2 tablets    [provider]  amLODipine  (NORVASC ) 5 MG tablet Take 5 mg  by mouth daily.    [provider]  aspirin  EC 81 MG tablet Take 81 mg by mouth daily.    [provider]  Azelastine HCl 137 MCG/SPRAY SOLN  12/06/20   [provider]  calcium  citrate-vitamin D  (CITRACAL+D) 315-200 MG-UNIT tablet Take 1 tablet by mouth daily. 02/14/21   Gudena, Vinay, MD  chlorhexidine (PERIDEX) 0.12 % solution Use as directed 15 mLs in the mouth or throat as needed. 11/09/21   [provider]  chlorpheniramine (CHLOR-TRIMETON) 4 MG tablet Take 4 mg by mouth 2 (two) times daily as needed for allergies.    [provider]  cholecalciferol  (VITAMIN D ) 1000 UNITS tablet Take 1,000 Units by mouth daily. 2000u    [provider]  clobetasol (TEMOVATE) 0.05 % external solution  04/28/20   [provider]  cyanocobalamin (VITAMIN B12) 500 MCG tablet 1 tablet Orally Once a day    [provider]  diltiazem (TIAZAC) 180 MG 24 hr capsule     [provider]  dupilumab  (DUPIXENT ) 300 MG/2ML prefilled syringe Inject 300 mg into the skin every 14 (fourteen) days. 08/01/23   Iva Marty Saltness, MD  fluocinonide gel (LIDEX) 0.05 %     [provider]  fluticasone (CUTIVATE) 0.05 % cream  04/26/20   [provider]  hydrOXYzine  (ATARAX /VISTARIL ) 25 MG tablet Take 1 tablet (25 mg total) by mouth every evening. 02/15/21   Iva Marty Saltness, MD  levocetirizine (XYZAL ) 5 MG tablet Take 2 tablets (10 mg total) by mouth every evening. 02/15/21 02/20/24  Iva Marty Saltness, MD  levofloxacin (LEVAQUIN) 500 MG tablet Take 500 mg by mouth daily. 10/05/23   [provider]  losartan (COZAAR) 100 MG tablet Take 100 mg by mouth daily.    [provider]  meloxicam (MOBIC) 15 MG tablet Take 15 mg by mouth daily.    [provider]  ofloxacin (OCUFLOX) 0.3 % ophthalmic solution  03/15/20   [provider]  omeprazole (PRILOSEC) 20 MG capsule Take 20 mg by mouth daily.    [provider]  ondansetron  (ZOFRAN -ODT) 4 MG disintegrating tablet 1 tablet on the tongue and allow to dissolve Orally every 8 hours for 14 days 12/12/23   [provider]  polycarbophil (FIBERCON) 625 MG tablet Take 625 mg by mouth as needed.    [provider]  spironolactone (ALDACTONE) 100 MG tablet 1 tablet Orally twice daily 11/07/23   [provider]  triamcinolone  ointment (KENALOG ) 0.1 % Apply 1 application topically 2 (two) times daily. 02/15/21   Iva Marty Saltness, MD    Physical Exam: Vitals:   03/21/24 2337 03/21/24 2345 03/22/24 0030 03/22/24 0203  BP: (!) 195/67 (!) 186/65 (!) 182/71 (!) 157/96  Pulse: 70 66 65 68  Resp: 16  16 17   Temp: 98.1 F (36.7 C)   98.2 F (36.8 C)  TempSrc: Oral   Oral  SpO2: 100% 99% 99% 99%  Weight:    49.3 kg  Height:    5' (1.524 m)   General: Patient is a frail but pleasant appearing female.  Laying comfortably in bed not in any acute distress. Head is atraumatic.  Oral mucosa moist. Neck: Supple with no JVD Chest: Clinically clear without any added sounds Cardiovascular: Regular S1-S2 Abdomen soft nontender with no organomegaly Extremities without any pedal edema CNS shows no focal deficits  Data Reviewed: Sodium is 119, potassium 5.3, chloride is 88, bicarb is 15, glucose 120, BUN 31, creatinine 1.52, calcium  is 10.5, anion gap is 16, WBC 8.1, hemoglobin is 9.7, hematocrit 27.6, platelet count 297 Urinalysis was unremarkable  CT abdomen and pelvis shows emphysema.  No evidence of bowel obstruction observation.  7mm lung nodule  Assessment and Plan: 83 year old with poorly controlled hypertension currently on losartan spironolactone and amlodipine .  She presents to the emergency room on account of abnormal labs in the PCP office.  Patient is essentially asymptomatic.  Labs however were concerning for profound hyponatremia, mild hyperkalemia and metabolic acidosis  Profound Hyponatremia: Associated  hypochloremia suggest dehydration likely to be contributory.  Patient admits to poor solute intake over the past several weeks.  Patient will need to be volume repleted with IV fluids.  Due to underlying metabolic acidosis, patient will be initiated on sodium bicarb drip.  AG metabolic acidosis: Etiology unclear.   replete with sodium bicarb drip.  Close follow-up with BMP later on today.  Hyperkalemia: Most likely induced by metabolic acidosis.  Losartan spironolactone likely contributory.  Dose of Lokelma  will be offered.  Will correct underlying metabolic acidosis.  AKI-likely due to acute dehydration in the context of poor oral intake.  With IV fluids, kidney injury will likely improve.  Avoid nephrotoxic medication.  Will hold losartan and spironolactone.  Hypertension: Blood pressure not at goal.  Will increase dose of amlodipine  to 10 mg.  Losartan and spironolactone held due to acute kidney injury and hyperkalemia.  hydralazine as needed.  History of Chek 2 mutation: Patient has been getting regular MRI for breast and colon cancer screening.   Advance Care Planning:   Code Status: Full Code   Consults:   Family Communication:   Severity of Illness: The appropriate patient status for this patient is INPATIENT. Inpatient status is judged to be reasonable and necessary in order to provide the required intensity of service to ensure the patient's safety. The patient's presenting symptoms, physical exam findings, and initial radiographic and laboratory data in the context of their chronic comorbidities is felt to place them at high risk for further clinical deterioration. Furthermore, it is not anticipated that the patient will be medically stable for discharge from the hospital within 2 midnights of admission.   * I certify that at the point of admission it is my clinical judgment that the patient will require inpatient hospital care spanning beyond 2 midnights from the point of admission  due to high intensity of service, high risk for further deterioration and high frequency of surveillance required.*  Author: Maude MARLA Dart, MD 03/22/2024 4:02 AM  For on call review [email or web address].

## 2024-03-22 NOTE — ED Notes (Signed)
 Patient transported to [facility] via [facility] at this time

## 2024-03-22 NOTE — Plan of Care (Signed)

## 2024-03-22 NOTE — Progress Notes (Signed)
 PROGRESS NOTE    Elaine Carter  FMW:[id number] DOB: 03-Apr-1941 DOA: 03/21/2024 PCP: Auston Opal, DO   Chief Complaint  Patient presents with   Hyponatremia    Brief Narrative:   This is a no charge note, as patient was admitted earlier this morning by Dr.Acheampong. - Patient was seen and examined, chart was reviewed  Elaine Carter is a 83 y.o. female with medical history significant of Chek 2 mutation, regularly follows up with hematologist Dr. Gudena.  Other comorbidities include hypertension on amlodipine , losartan and spironolactone.She also has chronic GERD for which she takes Omeprazole.  Over the past month however, patient has had intractable abdominal pain, for that reason, she has had poor oral intake.   She went to her primary care physician's office for her regular medical.  Labs done resulted showing abnormal labs.  She was advised to come to the emergency room to be further evaluated.  Patient admits to feeling generally weak but denies any fatigue, headache, dizziness or any altered mental status.   Labs done in the ED revealed profound hyponatremia of 119, potassium of 5.7.  Her bicarb was also low at 15.   Assessment & Plan:   Principal Problem:   Hyponatremia    Hypovolemic hyponatremia  Associated hypochloremia - This is in the setting of volume depletion and dehydration, most likely with poor oral intake and antihypertensive medication use. - Improving. - Continue with IV fluids  AG metabolic acidosis:  - Due to above, improving   Hyperkalemia:  - Hold losartan    AKI-likely due to acute dehydration in the context of poor oral intake.  With IV fluids, kidney injury will likely improve.  Avoid nephrotoxic medication.  Will hold losartan and spironolactone.   Hypertension:  -Blood pressure not at goal.  Will increase dose of amlodipine  to 10 mg.  Losartan and spironolactone held due to acute kidney injury and hyperkalemia.  hydralazine as needed.    History of Chek 2 mutation: Patient has been getting regular MRI for breast and colon cancer screening.   DVT prophylaxis: (Heparin /) Code Status: (Full) Family Communication: (D/W patient) Disposition:   Status is: Inpatient    Consultants:  none   Subjective:  He reports generalized weakness, fatigue, denies any chest pain, shortness of breath  Objective: Vitals:   03/22/24 0030 03/22/24 0203 03/22/24 0400 03/22/24 0906  BP: (!) 182/71 (!) 157/96 (!) 158/62 (!) 127/56  Pulse: 65 68 68 80  Resp: 16 17 15 14   Temp:  98.2 F (36.8 C) 98 F (36.7 C) 97.9 F (36.6 C)  TempSrc:  Oral Oral Oral  SpO2: 99% 99% 97% 100%  Weight:  49.3 kg    Height:  5' (1.524 m)      Intake/Output Summary (Last 24 hours) at 03/22/2024 1149 Last data filed at 03/22/2024 0114 Gross per 24 hour  Intake 500 ml  Output --  Net 500 ml   Filed Weights   03/22/24 0203  Weight: 49.3 kg    Examination:  Awake Alert, Oriented X 3, No new F.N deficits, Normal affect Symmetrical Chest wall movement, Good air movement bilaterally, CTAB RRR,No Gallops,Rubs or new Murmurs, No Parasternal Heave +ve B.Sounds, Abd Soft, No tenderness, No rebound - guarding or rigidity. No Cyanosis, Clubbing or edema, No new Rash or bruise     Data Reviewed: I have personally reviewed following labs and imaging studies  CBC: Recent Labs  Lab 03/21/24 1958 03/22/24 0406  WBC 8.1 6.4  HGB  9.7* 9.3*  HCT 27.6* 26.2*  MCV 91.1 91.9  PLT 297 296    Basic Metabolic Panel: Recent Labs  Lab 03/21/24 1958 03/22/24 0406  NA 119* 122*  K 5.3* 5.3*  CL 88* 99  CO2 15* 20*  GLUCOSE 120* 98  BUN 31* 30*  CREATININE 1.52* 1.38*  CALCIUM  10.5* 9.5    GFR: Estimated Creatinine Clearance: 22.6 mL/min (A) (by C-G formula based on SCr of 1.38 mg/dL (H)).  Liver Function Tests: Recent Labs  Lab 03/21/24 1958 03/22/24 0406  AST 23 20  ALT 13 14  ALKPHOS 106 77  BILITOT 0.6 0.8  PROT 7.0 6.3*  ALBUMIN  4.7 3.8    CBG: No results for input(s): GLUCAP in the last 168 hours.   No results found for this or any previous visit (from the past 240 hours).       Radiology Studies: CT ABDOMEN PELVIS WO CONTRAST Result Date: 03/21/2024 CLINICAL DATA:  Diarrhea. Nausea. Fatigue. New blood pressure medication. EXAM: CT ABDOMEN AND PELVIS WITHOUT CONTRAST TECHNIQUE: Multidetector CT imaging of the abdomen and pelvis was performed following the standard protocol without IV contrast. RADIATION DOSE REDUCTION: This exam was performed according to the departmental dose-optimization program which includes automated exposure control, adjustment of the mA and/or kV according to patient size and/or use of iterative reconstruction technique. COMPARISON:  None Available. FINDINGS: Lower chest: Emphysematous changes in the lungs. Right lung base nodule measuring 7 mm. Hepatobiliary: No focal liver abnormality is seen. No gallstones, gallbladder wall thickening, or biliary dilatation. Pancreas: Unremarkable. No pancreatic ductal dilatation or surrounding inflammatory changes. Spleen: Normal in size without focal abnormality. Adrenals/Urinary Tract: Adrenal glands are unremarkable. Kidneys are normal, without renal calculi, focal lesion, or hydronephrosis. Bladder is unremarkable. Stomach/Bowel: Stomach, small bowel, and colon are not abnormally distended. Scattered stool throughout the colon. No wall thickening or inflammatory stranding. Appendix is not identified. Vascular/Lymphatic: Aortic atherosclerosis. No enlarged abdominal or pelvic lymph nodes. Reproductive: Status post hysterectomy. No adnexal masses. Other: No free air or free fluid in the abdomen. Small periumbilical hernia containing fat. Musculoskeletal: Degenerative changes in the spine. Mild anterior subluxation of L4 on L5, likely degenerative. No acute bony abnormalities. IMPRESSION: 1. No evidence of bowel obstruction or inflammation. 2. 7 mm nodule in  the right lung base. Non-contrast chest CT at 6-12 months is recommended. If the nodule is stable at time of repeat CT, then future CT at 18-24 months (from today's scan) is considered optional for low-risk patients, but is recommended for high-risk patients. This recommendation follows the consensus statement: Guidelines for Management of Incidental Pulmonary Nodules Detected on CT Images: From the Fleischner Society 2017; Radiology 2017; 284:228-243. 3. Aortic atherosclerosis. 4. Small periumbilical hernia containing fat. Electronically Signed   By: Elsie Gravely M.D.   On: 03/21/2024 23:12        Scheduled Meds:  amLODipine   10 mg Oral Daily   aspirin  EC  81 mg Oral Daily   calcium -vitamin D   1 tablet Oral Daily   cholecalciferol   2,000 Units Oral Daily   heparin   5,000 Units Subcutaneous Q8H   Continuous Infusions:  sodium chloride  75 mL/hr at 03/22/24 0635     LOS: 0 days       Brayton Lye, MD [facility]   To contact the attending provider between 7A-7P or the covering provider during after hours 7P-7A, please log into the web site [email or web address] and access using universal [place] password for that web site. If  you do not have the password, please call the hospital operator.  03/22/2024, 11:49 AM

## 2024-03-23 DIAGNOSIS — E871 Hypo-osmolality and hyponatremia: Secondary | ICD-10-CM | POA: Diagnosis not present

## 2024-03-23 LAB — BASIC METABOLIC PANEL WITH GFR
Anion gap: 6 (ref 5–15)
BUN: 19 mg/dL (ref 8–23)
CO2: 19 mmol/L — ABNORMAL LOW (ref 22–32)
Calcium: 9.1 mg/dL (ref 8.9–10.3)
Chloride: 105 mmol/L (ref 98–111)
Creatinine, Ser: 0.96 mg/dL (ref 0.44–1.00)
GFR, Estimated: 59 mL/min — ABNORMAL LOW (ref >60)
Glucose, Bld: 91 mg/dL (ref 70–99)
Potassium: 4.3 mmol/L (ref 3.5–5.1)
Sodium: 130 mmol/L — ABNORMAL LOW (ref 135–145)

## 2024-03-23 LAB — IRON AND TIBC
Iron: 94 ug/dL (ref 28–170)
Saturation Ratios: 19 % (ref 10.4–31.8)
TIBC: 500 ug/dL — ABNORMAL HIGH (ref 250–450)
UIBC: 406 ug/dL

## 2024-03-23 LAB — CBC
HCT: 24.7 % — ABNORMAL LOW (ref 36.0–46.0)
Hemoglobin: 8.4 g/dL — ABNORMAL LOW (ref 12.0–15.0)
MCH: 31.8 pg (ref 26.0–34.0)
MCHC: 34 g/dL (ref 30.0–36.0)
MCV: 93.6 fL (ref 80.0–100.0)
Platelets: 272 K/uL (ref 150–400)
RBC: 2.64 MIL/uL — ABNORMAL LOW (ref 3.87–5.11)
RDW: 14.2 % (ref 11.5–15.5)
WBC: 5.9 K/uL (ref 4.0–10.5)
nRBC: 0 % (ref 0.0–0.2)

## 2024-03-23 LAB — PHOSPHORUS: Phosphorus: 3.1 mg/dL (ref 2.5–4.6)

## 2024-03-23 LAB — TSH: TSH: 1.342 u[IU]/mL (ref 0.350–4.500)

## 2024-03-23 LAB — PROCALCITONIN: Procalcitonin: <0.1 ng/mL

## 2024-03-23 LAB — RETICULOCYTES
Immature Retic Fract: 6 % (ref 2.3–15.9)
RBC.: 3.03 MIL/uL — ABNORMAL LOW (ref 3.87–5.11)
Retic Count, Absolute: 37.6 K/uL (ref 19.0–186.0)
Retic Ct Pct: 1.2 % (ref 0.4–3.1)

## 2024-03-23 LAB — MAGNESIUM: Magnesium: 1.7 mg/dL (ref 1.7–2.4)

## 2024-03-23 LAB — VITAMIN B12: Vitamin B-12: 1861 pg/mL — ABNORMAL HIGH (ref 180–914)

## 2024-03-23 LAB — FOLATE: Folate: 20.1 ng/mL (ref >5.9)

## 2024-03-23 LAB — FERRITIN: Ferritin: 15 ng/mL (ref 11–307)

## 2024-03-23 MED ORDER — MAGNESIUM SULFATE IN D5W 1-5 GM/100ML-% IV SOLN
1.0000 g | Freq: Once | INTRAVENOUS | Status: AC
  Administered 2024-03-23: 1 g via INTRAVENOUS
  Filled 2024-03-23: qty 100

## 2024-03-23 MED ORDER — ASPIRIN EC 81 MG PO TBEC: 81.0000 mg | DELAYED_RELEASE_TABLET | Freq: Every day | ORAL | Status: AC

## 2024-03-23 MED ORDER — CARVEDILOL 3.125 MG PO TABS: 3.1250 mg | ORAL_TABLET | Freq: Two times a day (BID) | ORAL | 0 refills | Status: AC

## 2024-03-23 MED ORDER — CARVEDILOL 3.125 MG PO TABS
3.1250 mg | ORAL_TABLET | Freq: Two times a day (BID) | ORAL | Status: DC
  Administered 2024-03-23: 3.125 mg via ORAL
  Filled 2024-03-23: qty 1

## 2024-03-23 MED ORDER — AMLODIPINE BESYLATE 10 MG PO TABS: 10.0000 mg | ORAL_TABLET | Freq: Every day | ORAL | 0 refills | Status: AC

## 2024-03-23 NOTE — Discharge Summary (Incomplete Revision)
 Physician Discharge Summary  Elaine Carter FMW:[id number] DOB: 20-Sep-1940 DOA: 03/21/2024  PCP: Auston Opal, DO  Admit date: 03/21/2024 Discharge date: 03/23/2024  Admitted From: (Home) Disposition:  (Home )  Recommendations for Outpatient Follow-up:  Follow up with PCP next week Please obtain BMP/CBC in one week Adjust antihypertensive medications as needed Patient will need follow-up on incidental finding of pulmonary nodules, please see discussion below Workup of iron deficiency anemia as an outpatient per PCP Please follow on the final results of vitamin D , still pending at time of discharge   Diet recommendation: Heart Healthy  Brief/Interim Summary:  Elaine Carter is a 83 y.o. female with medical history significant of Chek 2 mutation, regularly follows up with hematologist Dr. Gudena.  Other comorbidities include hypertension on amlodipine , losartan and spironolactone.She also has chronic GERD for which she takes Omeprazole.  Over the past month however, patient has had intractable abdominal pain, for that reason, she has had poor oral intake.   She went to her primary care physician's office for her regular medical.  Labs done resulted showing abnormal labs.  She was advised to come to the emergency room to be further evaluated.  Patient admits to feeling generally weak she denies any fever, headache, dizziness or any altered mental status.   Labs done in the ED revealed profound hyponatremia of 119, potassium of 5.7.  Her bicarb was also low at 15.  She was admitted for further workup, please see discussion below.  Discharge Diagnoses:  Principal Problem:   Hyponatremia    Hypovolemic hyponatremia With Associated hypochloremia - This is in the setting of volume depletion and dehydration, most likely with poor oral intake and while on antihypertensive medications,. - She was started on IV NS, sodium much improved, it is 130 on discharge.  AG metabolic acidosis:  - Due  to above, improved, anion gap went down from 16 on admission to 6 at time of discharge   Hyperkalemia:  - Potassium 5.7 on admission, most likely in the setting of AKI as well she is on losartan, (unsure if she is on Aldactone or not). - Received Lokelma , potassium much improved, it is 4.3 on discharge. - Continue to hold losartan on discharge   AKI -likely due to acute dehydration in the context of poor oral intake.  With IV fluids, kidney injury will likely improve.  Avoid nephrotoxic medication.  Will hold losartan and spironolactone. -Creatinine was 5.2 on discharge, this has normalized to 0.96 on discharge   Hypertension:  -Blood pressure not at goal.  Family reports patient was on amlodipine  which was stopped last year due to ankle edema, she was started on amlodipine  blood pressure much improved, remains mildly elevated so she was started on low-dose Coreg  3.125 mg p.o. twice daily. - Losartan has been held on discharge, this can be considered as an outpatient at a later time once her renal function confirmed to be stable.  PVC. - Patient had multiple PVCs initially, but this has totally resolved after her lecture light derangement was corrected,  Anemia of iron deficiency -Patient with anemia, hemoglobin is 8.4, anemia workup has been obtained, which significant for iron deficiency anemia, would recommend iron deficiency anemia workup as an outpatient.  Incidental pulmonary nodule -7 mm nodule in right lung base, incidental finding, will need repeat CT chest in 6 to 16-month please see CT reading for further recommendations   History of Chek 2 mutation: Patient has been getting regular MRI for breast and colon cancer  screening.  Pyuria - patient UA was significant for pyuria white blood cell of 6-10, but she denies any urinary symptoms, she had no leukocytosis, and no urinary symptoms, with procalcitonin within normal limit, so there was no indication to treat  Discharge  Instructions  Discharge Instructions     Diet - low sodium heart healthy   Complete by: As directed    Discharge instructions   Complete by: As directed    Follow with Primary MD Auston Opal, DO in 7 days   Get CBC, CMP,y checked  by Primary MD next visit.    Activity: As tolerated with Full fall precautions use walker/cane & assistance as needed   Disposition Home    Diet: Heart Healthy    On your next visit with your primary care physician please Get Medicines reviewed and adjusted.   Please request your Prim.MD to go over all Hospital Tests and Procedure/Radiological results at the follow up, please get all Hospital records sent to your Prim MD by signing hospital release before you go home.   If you experience worsening of your admission symptoms, develop shortness of breath, life threatening emergency, suicidal or homicidal thoughts you must seek medical attention immediately by calling 911 or calling your MD immediately  if symptoms less severe.  You Must read complete instructions/literature along with all the possible adverse reactions/side effects for all the Medicines you take and that have been prescribed to you. Take any new Medicines after you have completely understood and accpet all the possible adverse reactions/side effects.   Do not drive, operating heavy machinery, perform activities at heights, swimming or participation in water  activities or provide baby sitting services if your were admitted for syncope or siezures until you have seen by Primary MD or a Neurologist and advised to do so again.  Do not drive when taking Pain medications.    Do not take more than prescribed Pain, Sleep and Anxiety Medications  Special Instructions: If you have smoked or chewed Tobacco  in the last 2 yrs please stop smoking, stop any regular Alcohol  and or any Recreational drug use.  Wear Seat belts while driving.   Please note  You were cared for by a hospitalist  during your hospital stay. If you have any questions about your discharge medications or the care you received while you were in the hospital after you are discharged, you can call the unit and asked to speak with the hospitalist on call if the hospitalist that took care of you is not available. Once you are discharged, your primary care physician will handle any further medical issues. Please note that NO REFILLS for any discharge medications will be authorized once you are discharged, as it is imperative that you return to your primary care physician (or establish a relationship with a primary care physician if you do not have one) for your aftercare needs so that they can reassess your need for medications and monitor your lab values.   Increase activity slowly   Complete by: As directed       Allergies as of 03/23/2024       Reactions   Codeine Nausea And Vomiting   Diovan [valsartan] Other (See Comments)   GI intolerance    Indapamide Other (See Comments)   Hyponatremia    Sulfa Antibiotics Other (See Comments)   Unknown reaction   Zestril [lisinopril] Cough   Zofran  [ondansetron ] Other (See Comments)   Constipation    Allegra Allergy [fexofenadine Hcl]  Nausea Only   Dover Two-sided Adhesive Strap [attends Briefs Small] Rash   Tape Itching, Rash        Medication List     STOP taking these medications    losartan 100 MG tablet Commonly known as: COZAAR   meloxicam 15 MG tablet Commonly known as: MOBIC   spironolactone 100 MG tablet Commonly known as: ALDACTONE       TAKE these medications    amLODipine  10 MG tablet Commonly known as: NORVASC  Take 1 tablet (10 mg total) by mouth daily. Start taking on: March 24, 2024 What changed:  medication strength how much to take   aspirin  EC 81 MG tablet Take 1 tablet (81 mg total) by mouth daily.   CALCIUM  CITRATE+D3 PETITES PO Take 1 tablet by mouth daily.   carvedilol  3.125 MG tablet Commonly known as:  COREG  Take 1 tablet (3.125 mg total) by mouth 2 (two) times daily with a meal. What changed:  medication strength how much to take when to take this   CLARITIN-D 24 HOUR PO Take 1 tablet by mouth daily as needed (allergies).   Dupixent  300 MG/2ML prefilled syringe Generic drug: dupilumab  Inject 300 mg into the skin every 14 (fourteen) days.   omeprazole 20 MG capsule Commonly known as: PRILOSEC Take 20 mg by mouth daily.   ondansetron  4 MG disintegrating tablet Commonly known as: ZOFRAN -ODT Take 4 mg by mouth every 8 (eight) hours as needed for nausea or vomiting.   OVER THE COUNTER MEDICATION Take 1 tablet by mouth 2 (two) times daily as needed (runny nose). OTC short-acting allergy medications. Not chlorpheniramine or diphenhydramine.   triamcinolone  ointment 0.1 % Commonly known as: KENALOG  Apply 1 application topically 2 (two) times daily. What changed:  when to take this reasons to take this   VITAMIN B-12 PO Take 1 tablet by mouth daily.   VITAMIN D -3 PO Take 1 capsule by mouth daily.        Allergies  Allergen Reactions   Codeine Nausea And Vomiting   Diovan [Valsartan] Other (See Comments)    GI intolerance    Indapamide Other (See Comments)    Hyponatremia    Sulfa Antibiotics Other (See Comments)    Unknown reaction   Zestril [Lisinopril] Cough   Zofran  [Ondansetron ] Other (See Comments)    Constipation    Allegra Allergy [Fexofenadine Hcl] Nausea Only   Dover Two-Sided Adhesive Strap [Attends Briefs Small] Rash   Tape Itching and Rash    Consultations: None   Procedures/Studies: CT ABDOMEN PELVIS WO CONTRAST Result Date: 03/21/2024 CLINICAL DATA:  Diarrhea. Nausea. Fatigue. New blood pressure medication. EXAM: CT ABDOMEN AND PELVIS WITHOUT CONTRAST TECHNIQUE: Multidetector CT imaging of the abdomen and pelvis was performed following the standard protocol without IV contrast. RADIATION DOSE REDUCTION: This exam was performed according to the  departmental dose-optimization program which includes automated exposure control, adjustment of the mA and/or kV according to patient size and/or use of iterative reconstruction technique. COMPARISON:  None Available. FINDINGS: Lower chest: Emphysematous changes in the lungs. Right lung base nodule measuring 7 mm. Hepatobiliary: No focal liver abnormality is seen. No gallstones, gallbladder wall thickening, or biliary dilatation. Pancreas: Unremarkable. No pancreatic ductal dilatation or surrounding inflammatory changes. Spleen: Normal in size without focal abnormality. Adrenals/Urinary Tract: Adrenal glands are unremarkable. Kidneys are normal, without renal calculi, focal lesion, or hydronephrosis. Bladder is unremarkable. Stomach/Bowel: Stomach, small bowel, and colon are not abnormally distended. Scattered stool throughout the colon. No wall thickening or  inflammatory stranding. Appendix is not identified. Vascular/Lymphatic: Aortic atherosclerosis. No enlarged abdominal or pelvic lymph nodes. Reproductive: Status post hysterectomy. No adnexal masses. Other: No free air or free fluid in the abdomen. Small periumbilical hernia containing fat. Musculoskeletal: Degenerative changes in the spine. Mild anterior subluxation of L4 on L5, likely degenerative. No acute bony abnormalities. IMPRESSION: 1. No evidence of bowel obstruction or inflammation. 2. 7 mm nodule in the right lung base. Non-contrast chest CT at 6-12 months is recommended. If the nodule is stable at time of repeat CT, then future CT at 18-24 months (from today's scan) is considered optional for low-risk patients, but is recommended for high-risk patients. This recommendation follows the consensus statement: Guidelines for Management of Incidental Pulmonary Nodules Detected on CT Images: From the Fleischner Society 2017; Radiology 2017; 284:228-243. 3. Aortic atherosclerosis. 4. Small periumbilical hernia containing fat. Electronically Signed   By:  Elsie Gravely M.D.   On: 03/21/2024 23:12      Subjective: Appetite has improved, back to baseline, no further weakness or fatigue  Discharge Exam: Vitals:   03/23/24 1100 03/23/24 1124  BP: (!) 148/72   Pulse:  72  Resp:  16  Temp: (!) 97.4 F (36.3 C)   SpO2:  100%   Vitals:   03/23/24 0700 03/23/24 0808 03/23/24 1100 03/23/24 1124  BP: (!) 152/68 (!) 145/49 (!) 148/72   Pulse:  66  72  Resp:    16  Temp: (!) 97.5 F (36.4 C)  (!) 97.4 F (36.3 C)   TempSrc: Oral  Oral   SpO2:    100%  Weight:      Height:        General: Pt is alert, awake, not in acute distress Cardiovascular: RRR, S1/S2 +, no rubs, no gallops Respiratory: CTA bilaterally, no wheezing, no rhonchi Abdominal: Soft, NT, ND, bowel sounds + Extremities: no edema, no cyanosis    The results of significant diagnostics from this hospitalization (including imaging, microbiology, ancillary and laboratory) are listed below for reference.     Microbiology: No results found for this or any previous visit (from the past 240 hours).   Labs: BNP (last 3 results) No results for input(s): BNP in the last 8760 hours. Basic Metabolic Panel: Recent Labs  Lab 03/21/24 1958 03/22/24 0406 03/22/24 1437 03/23/24 0526  NA 119* 122* 126* 130*  K 5.3* 5.3* 4.6 4.3  CL 88* 99 99 105  CO2 15* 20* 19* 19*  GLUCOSE 120* 98 104* 91  BUN 31* 30* 26* 19  CREATININE 1.52* 1.38* 1.14* 0.96  CALCIUM  10.5* 9.5 9.5 9.1  MG  --   --  2.0 1.7  PHOS  --   --  2.9 3.1   Liver Function Tests: Recent Labs  Lab 03/21/24 1958 03/22/24 0406  AST 23 20  ALT 13 14  ALKPHOS 106 77  BILITOT 0.6 0.8  PROT 7.0 6.3*  ALBUMIN 4.7 3.8   Recent Labs  Lab 03/21/24 1958  LIPASE 61*   No results for input(s): AMMONIA in the last 168 hours. CBC: Recent Labs  Lab 03/21/24 1958 03/22/24 0406 03/23/24 0526  WBC 8.1 6.4 5.9  HGB 9.7* 9.3* 8.4*  HCT 27.6* 26.2* 24.7*  MCV 91.1 91.9 93.6  PLT 297 296 272    Cardiac Enzymes: No results for input(s): CKTOTAL, CKMB, CKMBINDEX, TROPONINI in the last 168 hours. BNP: Invalid input(s): POCBNP CBG: No results for input(s): GLUCAP in the last 168 hours. D-Dimer No results for  input(s): DDIMER in the last 72 hours. Hgb A1c No results for input(s): HGBA1C in the last 72 hours. Lipid Profile No results for input(s): CHOL, HDL, LDLCALC, TRIG, CHOLHDL, LDLDIRECT in the last 72 hours. Thyroid function studies Recent Labs    03/23/24 1100  TSH 1.342   Anemia work up Recent Labs    03/23/24 1100  FOLATE 20.1  FERRITIN 15  TIBC 500*  IRON 94  RETICCTPCT 1.2   Urinalysis    Component Value Date/Time   COLORURINE YELLOW 03/21/2024 2200   APPEARANCEUR CLEAR 03/21/2024 2200   LABSPEC 1.016 03/21/2024 2200   PHURINE 5.0 03/21/2024 2200   GLUCOSEU NEGATIVE 03/21/2024 2200   HGBUR NEGATIVE 03/21/2024 2200   BILIRUBINUR NEGATIVE 03/21/2024 2200   KETONESUR NEGATIVE 03/21/2024 2200   PROTEINUR NEGATIVE 03/21/2024 2200   UROBILINOGEN 0.2 10/06/2010 1300   NITRITE NEGATIVE 03/21/2024 2200   LEUKOCYTESUR MODERATE (A) 03/21/2024 2200   Sepsis Labs Recent Labs  Lab 03/21/24 1958 03/22/24 0406 03/23/24 0526  WBC 8.1 6.4 5.9   Microbiology No results found for this or any previous visit (from the past 240 hours).   Time coordinating discharge: Over 30 minutes  SIGNED:   Brayton Lye, MD  [facility] 03/23/2024, 12:42 PM Pager   If 7PM-7AM, please contact night-coverage [email or web address] Password [email or web address]

## 2024-03-23 NOTE — Plan of Care (Signed)
  Problem: Education: Goal: Knowledge of General Education information will improve Description: Including pain rating scale, medication(s)/side effects and non-pharmacologic comfort measures 03/23/2024 1419 by Alveria Reus, Gladis, RN Outcome: Completed/Met 03/23/2024 1419 by Alveria Reus, Gladis, RN Outcome: Progressing   Problem: Health Behavior/Discharge Planning: Goal: Ability to manage health-related needs will improve 03/23/2024 1419 by Alveria Reus, Gladis, RN Outcome: Completed/Met 03/23/2024 1419 by Alveria Reus, Gladis, RN Outcome: Progressing   Problem: Clinical Measurements: Goal: Ability to maintain clinical measurements within normal limits will improve 03/23/2024 1419 by Alveria Reus, Gladis, RN Outcome: Completed/Met 03/23/2024 1419 by Alveria Reus, Gladis, RN Outcome: Progressing Goal: Will remain free from infection 03/23/2024 1419 by Alveria Reus, Gladis, RN Outcome: Completed/Met 03/23/2024 1419 by Alveria Reus, Gladis, RN Outcome: Progressing Goal: Diagnostic test results will improve 03/23/2024 1419 by Alveria Reus, Gladis, RN Outcome: Completed/Met 03/23/2024 1419 by Alveria Reus, Gladis, RN Outcome: Progressing Goal: Respiratory complications will improve 03/23/2024 1419 by Alveria Reus, Gladis, RN Outcome: Completed/Met 03/23/2024 1419 by Alveria Reus, Gladis, RN Outcome: Progressing Goal: Cardiovascular complication will be avoided 03/23/2024 1419 by Alveria Reus, Gladis, RN Outcome: Completed/Met 03/23/2024 1419 by Alveria Reus Gladis, RN Outcome: Progressing   Problem: Activity: Goal: Risk for activity intolerance will decrease 03/23/2024 1419 by Alveria Reus, Gladis, RN Outcome: Completed/Met 03/23/2024 1419 by Alveria Reus, Ava Tangney, RN Outcome: Progressing   Problem: Nutrition: Goal: Adequate nutrition will be maintained 03/23/2024 1419 by Alveria Reus, Gladis, RN Outcome: Completed/Met 03/23/2024 1419 by Alveria Reus, Gladis, RN Outcome: Progressing   Problem:  Coping: Goal: Level of anxiety will decrease 03/23/2024 1419 by Alveria Reus, Gladis, RN Outcome: Completed/Met 03/23/2024 1419 by Alveria Reus, Gladis, RN Outcome: Progressing   Problem: Elimination: Goal: Will not experience complications related to bowel motility 03/23/2024 1419 by Alveria Reus, Gladis, RN Outcome: Completed/Met 03/23/2024 1419 by Alveria Reus, Gladis, RN Outcome: Progressing Goal: Will not experience complications related to urinary retention 03/23/2024 1419 by Alveria Reus, Gladis, RN Outcome: Completed/Met 03/23/2024 1419 by Alveria Reus, Gladis, RN Outcome: Progressing   Problem: Pain Managment: Goal: General experience of comfort will improve and/or be controlled 03/23/2024 1419 by Alveria Reus, Gladis, RN Outcome: Completed/Met 03/23/2024 1419 by Alveria Reus, Gladis, RN Outcome: Progressing   Problem: Safety: Goal: Ability to remain free from injury will improve 03/23/2024 1419 by Alveria Reus, Gladis, RN Outcome: Completed/Met 03/23/2024 1419 by Alveria Reus, Gladis, RN Outcome: Progressing   Problem: Skin Integrity: Goal: Risk for impaired skin integrity will decrease 03/23/2024 1419 by Alveria Reus Gladis, RN Outcome: Completed/Met 03/23/2024 1419 by Alveria Reus Gladis, RN Outcome: Progressing

## 2024-03-23 NOTE — Plan of Care (Signed)

## 2024-03-23 NOTE — Discharge Instructions (Signed)
 Follow with Primary MD Auston Opal, DO in 7 days   Get CBC, CMP,y checked  by Primary MD next visit.    Activity: As tolerated with Full fall precautions use walker/cane & assistance as needed   Disposition Home    Diet: Heart Healthy    On your next visit with your primary care physician please Get Medicines reviewed and adjusted.   Please request your Prim.MD to go over all Hospital Tests and Procedure/Radiological results at the follow up, please get all Hospital records sent to your Prim MD by signing hospital release before you go home.   If you experience worsening of your admission symptoms, develop shortness of breath, life threatening emergency, suicidal or homicidal thoughts you must seek medical attention immediately by calling 911 or calling your MD immediately  if symptoms less severe.  You Must read complete instructions/literature along with all the possible adverse reactions/side effects for all the Medicines you take and that have been prescribed to you. Take any new Medicines after you have completely understood and accpet all the possible adverse reactions/side effects.   Do not drive, operating heavy machinery, perform activities at heights, swimming or participation in water  activities or provide baby sitting services if your were admitted for syncope or siezures until you have seen by Primary MD or a Neurologist and advised to do so again.  Do not drive when taking Pain medications.    Do not take more than prescribed Pain, Sleep and Anxiety Medications  Special Instructions: If you have smoked or chewed Tobacco  in the last 2 yrs please stop smoking, stop any regular Alcohol  and or any Recreational drug use.  Wear Seat belts while driving.   Please note  You were cared for by a hospitalist during your hospital stay. If you have any questions about your discharge medications or the care you received while you were in the hospital after you are  discharged, you can call the unit and asked to speak with the hospitalist on call if the hospitalist that took care of you is not available. Once you are discharged, your primary care physician will handle any further medical issues. Please note that NO REFILLS for any discharge medications will be authorized once you are discharged, as it is imperative that you return to your primary care physician (or establish a relationship with a primary care physician if you do not have one) for your aftercare needs so that they can reassess your need for medications and monitor your lab values.

## 2024-03-23 NOTE — Discharge Summary (Signed)
 Physician Discharge Summary  Elaine Carter FMW:[id number] DOB: [birth year] DOA: 03/21/2024  PCP: Auston Opal, DO  Admit date: 03/21/2024 Discharge date: 03/23/2024  Admitted From: (Home) Disposition:  (Home )  Recommendations for Outpatient Follow-up:  Follow up with PCP next week Please obtain BMP/CBC in one week Adjust antihypertensive medications as needed Patient will need follow-up on incidental finding of pulmonary nodules, please see discussion below Workup of iron deficiency anemia as an outpatient per PCP Please follow on the final results of vitamin D , still pending at time of discharge   Diet recommendation: Heart Healthy  Brief/Interim Summary:  Elaine Carter is a 83 y.o. female with medical history significant of Chek 2 mutation, regularly follows up with hematologist Dr. Gudena.  Other comorbidities include hypertension on amlodipine , losartan and spironolactone.She also has chronic GERD for which she takes Omeprazole.  Over the past month however, patient has had intractable abdominal pain, for that reason, she has had poor oral intake.   She went to her primary care physician's office for her regular medical.  Labs done resulted showing abnormal labs.  She was advised to come to the emergency room to be further evaluated.  Patient admits to feeling generally weak she denies any fever, headache, dizziness or any altered mental status.   Labs done in the ED revealed profound hyponatremia of 119, potassium of 5.7.  Her bicarb was also low at 15.  She was admitted for further workup, please see discussion below.  Discharge Diagnoses:  Principal Problem:   Hyponatremia    Hypovolemic hyponatremia With Associated hypochloremia - This is in the setting of volume depletion and dehydration, most likely with poor oral intake and while on antihypertensive medications,. - She was started on IV NS, sodium much improved, it is 130 on discharge.  AG metabolic acidosis:  - Due  to above, improved, anion gap went down from 16 on admission to 6 at time of discharge   Hyperkalemia:  - Potassium 5.7 on admission, most likely in the setting of AKI as well she is on losartan, (unsure if she is on Aldactone or not). - Received Lokelma , potassium much improved, it is 4.3 on discharge. - Continue to hold losartan on discharge   AKI -likely due to acute dehydration in the context of poor oral intake.  With IV fluids, kidney injury will likely improve.  Avoid nephrotoxic medication.  Will hold losartan and spironolactone. -Creatinine was 5.2 on discharge, this has normalized to 0.96 on discharge   Hypertension:  -Blood pressure not at goal.  Family reports patient was on amlodipine  which was stopped last year due to ankle edema, she was started on amlodipine  blood pressure much improved, remains mildly elevated so she was started on low-dose Coreg  3.125 mg p.o. twice daily. - Losartan has been held on discharge, this can be considered as an outpatient at a later time once her renal function confirmed to be stable.  PVC. - Patient had multiple PVCs initially, but this has totally resolved after her lecture light derangement was corrected,  Anemia of iron deficiency -Patient with anemia, hemoglobin is 8.4, anemia workup has been obtained, which significant for iron deficiency anemia, would recommend iron deficiency anemia workup as an outpatient.  Incidental pulmonary nodule -7 mm nodule in right lung base, incidental finding, will need repeat CT chest in 6 to 89-month please see CT reading for further recommendations   History of Chek 2 mutation: Patient has been getting regular MRI for breast and colon cancer  screening.  Discharge Instructions  Discharge Instructions     Diet - low sodium heart healthy   Complete by: As directed    Discharge instructions   Complete by: As directed    Follow with Primary MD Auston Opal, DO in 7 days   Get CBC, CMP,y checked  by  Primary MD next visit.    Activity: As tolerated with Full fall precautions use walker/cane & assistance as needed   Disposition Home    Diet: Heart Healthy    On your next visit with your primary care physician please Get Medicines reviewed and adjusted.   Please request your Prim.MD to go over all Hospital Tests and Procedure/Radiological results at the follow up, please get all Hospital records sent to your Prim MD by signing hospital release before you go home.   If you experience worsening of your admission symptoms, develop shortness of breath, life threatening emergency, suicidal or homicidal thoughts you must seek medical attention immediately by calling 911 or calling your MD immediately  if symptoms less severe.  You Must read complete instructions/literature along with all the possible adverse reactions/side effects for all the Medicines you take and that have been prescribed to you. Take any new Medicines after you have completely understood and accpet all the possible adverse reactions/side effects.   Do not drive, operating heavy machinery, perform activities at heights, swimming or participation in water  activities or provide baby sitting services if your were admitted for syncope or siezures until you have seen by Primary MD or a Neurologist and advised to do so again.  Do not drive when taking Pain medications.    Do not take more than prescribed Pain, Sleep and Anxiety Medications  Special Instructions: If you have smoked or chewed Tobacco  in the last 2 yrs please stop smoking, stop any regular Alcohol  and or any Recreational drug use.  Wear Seat belts while driving.   Please note  You were cared for by a hospitalist during your hospital stay. If you have any questions about your discharge medications or the care you received while you were in the hospital after you are discharged, you can call the unit and asked to speak with the hospitalist on call if the  hospitalist that took care of you is not available. Once you are discharged, your primary care physician will handle any further medical issues. Please note that NO REFILLS for any discharge medications will be authorized once you are discharged, as it is imperative that you return to your primary care physician (or establish a relationship with a primary care physician if you do not have one) for your aftercare needs so that they can reassess your need for medications and monitor your lab values.   Increase activity slowly   Complete by: As directed       Allergies as of 03/23/2024       Reactions   Codeine Nausea And Vomiting   Diovan [valsartan] Other (See Comments)   GI intolerance    Indapamide Other (See Comments)   Hyponatremia    Sulfa Antibiotics Other (See Comments)   Unknown reaction   Zestril [lisinopril] Cough   Zofran  [ondansetron ] Other (See Comments)   Constipation    Allegra Allergy [fexofenadine Hcl] Nausea Only   Dover Two-sided Adhesive Strap [attends Briefs Small] Rash   Tape Itching, Rash        Medication List     STOP taking these medications    losartan 100 MG  tablet Commonly known as: COZAAR   meloxicam 15 MG tablet Commonly known as: MOBIC   spironolactone 100 MG tablet Commonly known as: ALDACTONE       TAKE these medications    amLODipine  10 MG tablet Commonly known as: NORVASC  Take 1 tablet (10 mg total) by mouth daily. Start taking on: March 24, 2024 What changed:  medication strength how much to take   aspirin  EC 81 MG tablet Take 1 tablet (81 mg total) by mouth daily.   CALCIUM  CITRATE+D3 PETITES PO Take 1 tablet by mouth daily.   carvedilol  3.125 MG tablet Commonly known as: COREG  Take 1 tablet (3.125 mg total) by mouth 2 (two) times daily with a meal. What changed:  medication strength how much to take when to take this   CLARITIN-D 24 HOUR PO Take 1 tablet by mouth daily as needed (allergies).   Dupixent  300  MG/2ML prefilled syringe Generic drug: dupilumab  Inject 300 mg into the skin every 14 (fourteen) days.   omeprazole 20 MG capsule Commonly known as: PRILOSEC Take 20 mg by mouth daily.   ondansetron  4 MG disintegrating tablet Commonly known as: ZOFRAN -ODT Take 4 mg by mouth every 8 (eight) hours as needed for nausea or vomiting.   OVER THE COUNTER MEDICATION Take 1 tablet by mouth 2 (two) times daily as needed (runny nose). OTC short-acting allergy medications. Not chlorpheniramine or diphenhydramine.   triamcinolone  ointment 0.1 % Commonly known as: KENALOG  Apply 1 application topically 2 (two) times daily. What changed:  when to take this reasons to take this   VITAMIN B-12 PO Take 1 tablet by mouth daily.   VITAMIN D -3 PO Take 1 capsule by mouth daily.        Allergies  Allergen Reactions   Codeine Nausea And Vomiting   Diovan [Valsartan] Other (See Comments)    GI intolerance    Indapamide Other (See Comments)    Hyponatremia    Sulfa Antibiotics Other (See Comments)    Unknown reaction   Zestril [Lisinopril] Cough   Zofran  [Ondansetron ] Other (See Comments)    Constipation    Allegra Allergy [Fexofenadine Hcl] Nausea Only   Dover Two-Sided Adhesive Strap [Attends Briefs Small] Rash   Tape Itching and Rash    Consultations: None   Procedures/Studies: CT ABDOMEN PELVIS WO CONTRAST Result Date: 03/21/2024 CLINICAL DATA:  Diarrhea. Nausea. Fatigue. New blood pressure medication. EXAM: CT ABDOMEN AND PELVIS WITHOUT CONTRAST TECHNIQUE: Multidetector CT imaging of the abdomen and pelvis was performed following the standard protocol without IV contrast. RADIATION DOSE REDUCTION: This exam was performed according to the departmental dose-optimization program which includes automated exposure control, adjustment of the mA and/or kV according to patient size and/or use of iterative reconstruction technique. COMPARISON:  None Available. FINDINGS: Lower chest:  Emphysematous changes in the lungs. Right lung base nodule measuring 7 mm. Hepatobiliary: No focal liver abnormality is seen. No gallstones, gallbladder wall thickening, or biliary dilatation. Pancreas: Unremarkable. No pancreatic ductal dilatation or surrounding inflammatory changes. Spleen: Normal in size without focal abnormality. Adrenals/Urinary Tract: Adrenal glands are unremarkable. Kidneys are normal, without renal calculi, focal lesion, or hydronephrosis. Bladder is unremarkable. Stomach/Bowel: Stomach, small bowel, and colon are not abnormally distended. Scattered stool throughout the colon. No wall thickening or inflammatory stranding. Appendix is not identified. Vascular/Lymphatic: Aortic atherosclerosis. No enlarged abdominal or pelvic lymph nodes. Reproductive: Status post hysterectomy. No adnexal masses. Other: No free air or free fluid in the abdomen. Small periumbilical hernia containing fat. Musculoskeletal: Degenerative  changes in the spine. Mild anterior subluxation of L4 on L5, likely degenerative. No acute bony abnormalities. IMPRESSION: 1. No evidence of bowel obstruction or inflammation. 2. 7 mm nodule in the right lung base. Non-contrast chest CT at 6-12 months is recommended. If the nodule is stable at time of repeat CT, then future CT at 18-24 months (from today's scan) is considered optional for low-risk patients, but is recommended for high-risk patients. This recommendation follows the consensus statement: Guidelines for Management of Incidental Pulmonary Nodules Detected on CT Images: From the Fleischner Society 2017; Radiology 2017; 284:228-243. 3. Aortic atherosclerosis. 4. Small periumbilical hernia containing fat. Electronically Signed   By: Elsie Gravely M.D.   On: 03/21/2024 23:12      Subjective: Appetite has improved, back to baseline, no further weakness or fatigue  Discharge Exam: Vitals:   03/23/24 1100 03/23/24 1124  BP: (!) 148/72   Pulse:  72  Resp:  16   Temp: (!) 97.4 F (36.3 C)   SpO2:  100%   Vitals:   03/23/24 0700 03/23/24 0808 03/23/24 1100 03/23/24 1124  BP: (!) 152/68 (!) 145/49 (!) 148/72   Pulse:  66  72  Resp:    16  Temp: (!) 97.5 F (36.4 C)  (!) 97.4 F (36.3 C)   TempSrc: Oral  Oral   SpO2:    100%  Weight:      Height:        General: Pt is alert, awake, not in acute distress Cardiovascular: RRR, S1/S2 +, no rubs, no gallops Respiratory: CTA bilaterally, no wheezing, no rhonchi Abdominal: Soft, NT, ND, bowel sounds + Extremities: no edema, no cyanosis    The results of significant diagnostics from this hospitalization (including imaging, microbiology, ancillary and laboratory) are listed below for reference.     Microbiology: No results found for this or any previous visit (from the past 240 hours).   Labs: BNP (last 3 results) No results for input(s): BNP in the last 8760 hours. Basic Metabolic Panel: Recent Labs  Lab 03/21/24 1958 03/22/24 0406 03/22/24 1437 03/23/24 0526  NA 119* 122* 126* 130*  K 5.3* 5.3* 4.6 4.3  CL 88* 99 99 105  CO2 15* 20* 19* 19*  GLUCOSE 120* 98 104* 91  BUN 31* 30* 26* 19  CREATININE 1.52* 1.38* 1.14* 0.96  CALCIUM  10.5* 9.5 9.5 9.1  MG  --   --  2.0 1.7  PHOS  --   --  2.9 3.1   Liver Function Tests: Recent Labs  Lab 03/21/24 1958 03/22/24 0406  AST 23 20  ALT 13 14  ALKPHOS 106 77  BILITOT 0.6 0.8  PROT 7.0 6.3*  ALBUMIN 4.7 3.8   Recent Labs  Lab 03/21/24 1958  LIPASE 61*   No results for input(s): AMMONIA in the last 168 hours. CBC: Recent Labs  Lab 03/21/24 1958 03/22/24 0406 03/23/24 0526  WBC 8.1 6.4 5.9  HGB 9.7* 9.3* 8.4*  HCT 27.6* 26.2* 24.7*  MCV 91.1 91.9 93.6  PLT 297 296 272   Cardiac Enzymes: No results for input(s): CKTOTAL, CKMB, CKMBINDEX, TROPONINI in the last 168 hours. BNP: Invalid input(s): POCBNP CBG: No results for input(s): GLUCAP in the last 168 hours. D-Dimer No results for input(s):  DDIMER in the last 72 hours. Hgb A1c No results for input(s): HGBA1C in the last 72 hours. Lipid Profile No results for input(s): CHOL, HDL, LDLCALC, TRIG, CHOLHDL, LDLDIRECT in the last 72 hours. Thyroid function studies Recent  Labs    03/23/24 1100  TSH 1.342   Anemia work up Recent Labs    03/23/24 1100  FOLATE 20.1  FERRITIN 15  TIBC 500*  IRON 94  RETICCTPCT 1.2   Urinalysis    Component Value Date/Time   COLORURINE YELLOW 03/21/2024 2200   APPEARANCEUR CLEAR 03/21/2024 2200   LABSPEC 1.016 03/21/2024 2200   PHURINE 5.0 03/21/2024 2200   GLUCOSEU NEGATIVE 03/21/2024 2200   HGBUR NEGATIVE 03/21/2024 2200   BILIRUBINUR NEGATIVE 03/21/2024 2200   KETONESUR NEGATIVE 03/21/2024 2200   PROTEINUR NEGATIVE 03/21/2024 2200   UROBILINOGEN 0.2 10/06/2010 1300   NITRITE NEGATIVE 03/21/2024 2200   LEUKOCYTESUR MODERATE (A) 03/21/2024 2200   Sepsis Labs Recent Labs  Lab 03/21/24 1958 03/22/24 0406 03/23/24 0526  WBC 8.1 6.4 5.9   Microbiology No results found for this or any previous visit (from the past 240 hours).   Time coordinating discharge: Over 30 minutes  SIGNED:   Brayton Lye, MD  [facility] 03/23/2024, 12:42 PM Pager   If 7PM-7AM, please contact night-coverage [email or web address] Password [email or web address]

## 2024-03-31 LAB — 25-HYDROXY VITAMIN D LCMS D2+D3
25-Hydroxy, Vitamin D-2: <1 ng/mL
25-Hydroxy, Vitamin D-3: 43 ng/mL
25-Hydroxy, Vitamin D: 43 ng/mL

## 2024-04-01 ENCOUNTER — Ambulatory Visit

## 2024-04-01 DIAGNOSIS — L209 Atopic dermatitis, unspecified: Secondary | ICD-10-CM | POA: Diagnosis not present

## 2024-04-14 ENCOUNTER — Ambulatory Visit (INDEPENDENT_AMBULATORY_CARE_PROVIDER_SITE_OTHER)

## 2024-04-14 DIAGNOSIS — L209 Atopic dermatitis, unspecified: Secondary | ICD-10-CM

## 2024-04-15 ENCOUNTER — Ambulatory Visit

## 2024-04-28 ENCOUNTER — Ambulatory Visit

## 2024-04-28 DIAGNOSIS — L209 Atopic dermatitis, unspecified: Secondary | ICD-10-CM | POA: Diagnosis not present

## 2024-05-12 ENCOUNTER — Ambulatory Visit

## 2024-05-14 ENCOUNTER — Ambulatory Visit

## 2024-05-14 DIAGNOSIS — L209 Atopic dermatitis, unspecified: Secondary | ICD-10-CM

## 2024-05-28 ENCOUNTER — Ambulatory Visit

## 2024-05-28 DIAGNOSIS — L209 Atopic dermatitis, unspecified: Secondary | ICD-10-CM | POA: Diagnosis not present

## 2024-06-11 ENCOUNTER — Ambulatory Visit

## 2024-06-11 DIAGNOSIS — L209 Atopic dermatitis, unspecified: Secondary | ICD-10-CM

## 2024-06-23 ENCOUNTER — Ambulatory Visit

## 2024-06-30 ENCOUNTER — Ambulatory Visit

## 2024-06-30 DIAGNOSIS — L209 Atopic dermatitis, unspecified: Secondary | ICD-10-CM

## 2024-07-14 ENCOUNTER — Ambulatory Visit

## 2024-07-14 DIAGNOSIS — L209 Atopic dermatitis, unspecified: Secondary | ICD-10-CM

## 2024-08-04 ENCOUNTER — Ambulatory Visit (INDEPENDENT_AMBULATORY_CARE_PROVIDER_SITE_OTHER)

## 2024-08-04 DIAGNOSIS — L209 Atopic dermatitis, unspecified: Secondary | ICD-10-CM | POA: Diagnosis not present

## 2024-08-05 ENCOUNTER — Other Ambulatory Visit: Payer: Self-pay | Admitting: Allergy & Immunology

## 2024-08-06 ENCOUNTER — Other Ambulatory Visit (HOSPITAL_COMMUNITY): Payer: Self-pay

## 2024-08-15 ENCOUNTER — Other Ambulatory Visit: Payer: Self-pay | Admitting: Hematology and Oncology

## 2024-08-15 DIAGNOSIS — Z1231 Encounter for screening mammogram for malignant neoplasm of breast: Secondary | ICD-10-CM

## 2024-08-18 ENCOUNTER — Ambulatory Visit

## 2024-08-18 DIAGNOSIS — L209 Atopic dermatitis, unspecified: Secondary | ICD-10-CM | POA: Diagnosis not present

## 2024-09-03 ENCOUNTER — Ambulatory Visit

## 2024-09-03 DIAGNOSIS — L209 Atopic dermatitis, unspecified: Secondary | ICD-10-CM

## 2024-09-05 ENCOUNTER — Ambulatory Visit: Admission: RE | Admit: 2024-09-05 | Source: Ambulatory Visit

## 2024-09-05 DIAGNOSIS — Z1231 Encounter for screening mammogram for malignant neoplasm of breast: Secondary | ICD-10-CM

## 2024-09-18 ENCOUNTER — Ambulatory Visit

## 2025-02-19 ENCOUNTER — Ambulatory Visit: Admitting: Hematology and Oncology
